# Patient Record
Sex: Male | Born: 1965
Health system: Southern US, Community
[De-identification: ages and names within clinical notes are randomized; demographics above are authoritative.]

## PROBLEM LIST (undated history)

## (undated) DIAGNOSIS — I4892 Unspecified atrial flutter: Secondary | ICD-10-CM

## (undated) DIAGNOSIS — Z720 Tobacco use: Secondary | ICD-10-CM

## (undated) HISTORY — PX: WISDOM TOOTH EXTRACTION: SHX21

## (undated) HISTORY — PX: APPENDECTOMY: SHX54

## (undated) HISTORY — DX: Tobacco use: Z72.0

## (undated) HISTORY — PX: LIPOMA EXCISION: SHX5283

---

## 2008-12-24 ENCOUNTER — Ambulatory Visit: Payer: Self-pay | Admitting: Family Medicine

## 2008-12-24 DIAGNOSIS — D179 Benign lipomatous neoplasm, unspecified: Secondary | ICD-10-CM | POA: Insufficient documentation

## 2008-12-24 DIAGNOSIS — K029 Dental caries, unspecified: Secondary | ICD-10-CM | POA: Insufficient documentation

## 2008-12-24 DIAGNOSIS — F411 Generalized anxiety disorder: Secondary | ICD-10-CM | POA: Insufficient documentation

## 2008-12-24 DIAGNOSIS — E669 Obesity, unspecified: Secondary | ICD-10-CM

## 2009-01-06 ENCOUNTER — Encounter (INDEPENDENT_AMBULATORY_CARE_PROVIDER_SITE_OTHER): Payer: Self-pay | Admitting: Family Medicine

## 2009-01-30 ENCOUNTER — Ambulatory Visit (HOSPITAL_COMMUNITY): Admission: RE | Admit: 2009-01-30 | Discharge: 2009-01-30 | Payer: Self-pay | Admitting: General Surgery

## 2009-01-30 ENCOUNTER — Encounter (INDEPENDENT_AMBULATORY_CARE_PROVIDER_SITE_OTHER): Payer: Self-pay | Admitting: General Surgery

## 2009-02-12 ENCOUNTER — Encounter (INDEPENDENT_AMBULATORY_CARE_PROVIDER_SITE_OTHER): Payer: Self-pay | Admitting: Family Medicine

## 2009-03-07 ENCOUNTER — Encounter (INDEPENDENT_AMBULATORY_CARE_PROVIDER_SITE_OTHER): Payer: Self-pay | Admitting: *Deleted

## 2009-03-07 DIAGNOSIS — F17200 Nicotine dependence, unspecified, uncomplicated: Secondary | ICD-10-CM | POA: Insufficient documentation

## 2009-05-12 ENCOUNTER — Encounter: Payer: Self-pay | Admitting: Family Medicine

## 2009-06-16 ENCOUNTER — Encounter: Payer: Self-pay | Admitting: Family Medicine

## 2009-06-30 ENCOUNTER — Encounter: Admission: RE | Admit: 2009-06-30 | Discharge: 2009-08-21 | Payer: Self-pay | Admitting: General Surgery

## 2009-07-06 ENCOUNTER — Encounter: Admission: RE | Admit: 2009-07-06 | Discharge: 2009-08-21 | Payer: Self-pay | Admitting: General Surgery

## 2009-08-04 ENCOUNTER — Telehealth: Payer: Self-pay | Admitting: Family Medicine

## 2009-08-05 ENCOUNTER — Ambulatory Visit: Payer: Self-pay | Admitting: Family Medicine

## 2009-08-05 DIAGNOSIS — H60399 Other infective otitis externa, unspecified ear: Secondary | ICD-10-CM | POA: Insufficient documentation

## 2009-08-05 DIAGNOSIS — H6120 Impacted cerumen, unspecified ear: Secondary | ICD-10-CM

## 2009-08-07 ENCOUNTER — Ambulatory Visit: Payer: Self-pay | Admitting: Family Medicine

## 2009-08-07 DIAGNOSIS — I10 Essential (primary) hypertension: Secondary | ICD-10-CM | POA: Insufficient documentation

## 2010-05-25 ENCOUNTER — Encounter: Payer: Self-pay | Admitting: Family Medicine

## 2010-06-29 NOTE — Assessment & Plan Note (Signed)
Summary: f/u ear/eo   Vital Signs:  Patient profile:   45 year old male Weight:      233.0 pounds BMI:     34.04 Temp:     98.2 degrees F Pulse rate:   87 / minute BP sitting:   162 / 105  (right arm)  Vitals Entered By: Starleen Blue RN (August 07, 2009 9:29 AM) CC: f/u ear Is Patient Diabetic? No Pain Assessment Patient in pain? yes     Location: l ear Intensity: 4   CC:  f/u ear.  History of Present Illness: 1. Left ear pain and hearing problems f/u:  Pt is here for a follow up on his left ear.  He was seen in clinic this Tuesday because of left ear pain and decreased hearing.  Diagnosed with cerumen impaction and otitis externa.  We tried to irrigate the cerumen out at last office visit but was unable to because he couldn't tolerate it because of pain.  We put him on antibiotic ear drops, numbing drops, and pain medicines.  His pain has finally improved today.  He is still unable to hear out of his left ear.      ROS: denies fevers, headache  2. HTN: Pt has had two elevated BP readings.  This visit and last visit.  He thinks that it may be up from his pain.  He was also on a cortisone patch yesterday because of right shoulder pain.  He does not want to start another blood pressure medicine.  Would like to wait until his ear issue is resolved before thinking about this.      ROS: denies chest pain, shortness of breath  Habits & Providers  Alcohol-Tobacco-Diet     Tobacco Status: current     Cigarette Packs/Day: 0.5  Current Medications (verified): 1)  Vicodin 5-500 Mg Tabs (Hydrocodone-Acetaminophen) .... Take 1 Tab By Mouth Every 6 Hours As Needed For Pain 2)  Ear Drops 1.4-5.4 % Soln (Benzocaine-Antipyrine) .... Apply A Couple Drops in Affected Ear Every 6 Hours As Neede For Pain 3)  Neomycin-Polymyxin-Hc 3.5-10000-1 Susp (Neomycin-Polymyxin-Hc) .... Instill 4 Drops in Eary 4 Times A Day  Allergies: No Known Drug Allergies  Past History:  Past Medical  History: Reviewed history from 12/24/2008 and no changes required. tobacco abuse  Social History: Reviewed history from 12/24/2008 and no changes required. Currently lives with mother, neice (05/19/1988), nephew (10/07/1989), son -Campbell Stall (07/04/1990). Single. Smoking cigarettes.  Occasional Etoh, occasional mju. currently unemployed. Worked various jobs in the past.  Getting ready to go back to school for [employer]. In jail for past for being behind in child support. Finished high school.   Physical Exam  General:  Vitals reviewed, no acute distress  Head:  normocephalic and atraumatic.   Eyes:  no conjunctival redness Ears:  R ear normal.  L ear canal impacted with cerumen.  After irrigation still 100% occluded.  L external canal irritated, red.  Painful with ear speculum and pulling on Tragus but overall improved.  Unable to visualize TM. Nose:  no external deformity and no nasal discharge.   Mouth:  pharynx pink and moist.  No dental carries.  No tonsillar swelling. Neck:  Normal, no masses Lungs:  normal respiratory effort.   Heart:  normal rate and regular rhythm.   Extremities:  no edema/cyanosis or clubbing   Impression & Recommendations:  Problem # 1:  IMPACTED CERUMEN (ICD-380.4) Attempted to remove cerumen with water irrigation again today.  Was able  to get a significant chunk of wax out but the ear canal was still completely occluded.  Will send to ENT for treatment. Orders: ENT Referral (ENT) [facility]- Est  Level 4 (16109)  Problem # 2:  OTITIS EXTERNA (ICD-380.10) Assessment: Improved  Will continue current treatment His updated medication list for this problem includes:    Ear Drops 1.4-5.4 % Soln (Benzocaine-antipyrine) .Marland Kitchen... Apply a couple drops in affected ear every 6 hours as neede for pain    Neomycin-polymyxin-hc 3.5-10000-1 Susp (Neomycin-polymyxin-hc) ..... Instill 4 drops in eary 4 times a day  Orders: [facility]- Est  Level 4 (99214)  Problem # 3:  ESSENTIAL  HYPERTENSION (ICD-401.9) Assessment: New  Has had two elevated blood pressure readings.  He has been in some pain both times.  He is resistant to starting an anti-hypertensive.  Will follow up with this after his ear problems are improved.  Orders: [facility]- Est  Level 4 (99214)  Complete Medication List: 1)  Vicodin 5-500 Mg Tabs (Hydrocodone-acetaminophen) .... Take 1 tab by mouth every 6 hours as needed for pain 2)  Ear Drops 1.4-5.4 % Soln (Benzocaine-antipyrine) .... Apply a couple drops in affected ear every 6 hours as neede for pain 3)  Neomycin-polymyxin-hc 3.5-10000-1 Susp (Neomycin-polymyxin-hc) .... Instill 4 drops in eary 4 times a day  Patient Instructions: 1)  Well we tried to get rid of that wax again but it is stuck in there 2)  Continue using the ear drops for the infection and pain 3)  I have put in a referral to an ENT doctor, they have special tools that they can use to help treat you 4)  We also need to keep an eye on your blood pressure, it has been too high the last two times.  It may be from your pain. 5)  Please schedule a follow up appointment with me in 6-8 weeks to follow up on your ear and your blood pressure

## 2010-06-29 NOTE — Assessment & Plan Note (Signed)
Summary: Left ear pain   Vital Signs:  Patient profile:   45 year old male Height:      69.5 inches Weight:      233.9 pounds BMI:     34.17 Temp:     97.7 degrees F oral Pulse rate:   75 / minute BP sitting:   159 / 99  (left arm) Cuff size:   large  Vitals Entered By: Garen Grams LPN (August 05, 1608 8:42 AM) CC: Left ear pain Is Patient Diabetic? No Pain Assessment Patient in pain? yes     Location: right ear   CC:  Left ear pain.  History of Present Illness: 1. Left ear pain:  Pt was using Murine ear drops 2 weeks ago because he thought that he was having some wax build up.  Since last week he has developed some ear pain and decreased hearing.  He has never had something like this happen before.  It is getting worse.  Now his pain is severe.  He is unable to rest, hurts to touch the ear, to swallow.  He is also having a hard time hearing anything now.      ROS: denies fevers, headache, dizziness, balance problems.  Habits & Providers  Alcohol-Tobacco-Diet     Tobacco Status: current  Current Medications (verified): 1)  Vicodin 5-500 Mg Tabs (Hydrocodone-Acetaminophen) .... Take 1 Tab By Mouth Every 6 Hours As Needed For Pain 2)  Ear Drops 1.4-5.4 % Soln (Benzocaine-Antipyrine) .... Apply A Couple Drops in Affected Ear Every 6 Hours As Neede For Pain 3)  Neomycin-Polymyxin-Hc 3.5-10000-1 Susp (Neomycin-Polymyxin-Hc) .... Instill 4 Drops in Eary 4 Times A Day  Allergies: No Known Drug Allergies  Past History:  Past Medical History: Reviewed history from 12/24/2008 and no changes required. tobacco abuse  Social History: Reviewed history from 12/24/2008 and no changes required. Currently lives with mother, neice (05/19/1988), nephew (10/07/1989), son -Campbell Stall (07/04/1990). Single. Smoking cigarettes.  Occasional Etoh, occasional mju. currently unemployed. Worked various jobs in the past.  Getting ready to go back to school for [employer]. In jail for past for being  behind in child support. Finished high school.   Physical Exam  General:  Vitals reviewed, no acute distress  Head:  normocephalic and atraumatic.   Eyes:  no conjunctival redness Ears:  R ear normal.  L ear canal impacted with cerumen.  After irrigation still 75% occluded.  L external canal irritated, red.  Painful with ear speculum and pulling on Tragus.  TM appears intact but unable to fully visualize. Nose:  no external deformity and no nasal discharge.   Mouth:  pharynx pink and moist.  No dental carries.  No tonsillar swelling. Neck:  Normal, no masses Lungs:  normal respiratory effort.   Heart:  normal rate and regular rhythm.     Impression & Recommendations:  Problem # 1:  IMPACTED CERUMEN (ICD-380.4) Assessment New  Improved with saline irrigation.  Still around 75% occluded.  Advised him to avoid q-tips.  Will have him follow up in 2 days to see if we can better visualize tympanic membrane.  Orders: [facility]- Est Level  3 (96045)  Problem # 2:  OTITIS EXTERNA (ICD-380.10) Assessment: New  Ear canal is very tender, red and irritated.  Will treat as Otitis Externa.  Antibiotic eardrops and Benzocaine ear drops for pain.  Will also provide few Vicodin given severity of pain. His updated medication list for this problem includes:    Ear Drops  1.4-5.4 % Soln (Benzocaine-antipyrine) .Marland Kitchen... Apply a couple drops in affected ear every 6 hours as neede for pain    Neomycin-polymyxin-hc 3.5-10000-1 Susp (Neomycin-polymyxin-hc) ..... Instill 4 drops in eary 4 times a day  Orders: [facility]- Est Level  3 (99213)  Complete Medication List: 1)  Vicodin 5-500 Mg Tabs (Hydrocodone-acetaminophen) .... Take 1 tab by mouth every 6 hours as needed for pain 2)  Ear Drops 1.4-5.4 % Soln (Benzocaine-antipyrine) .... Apply a couple drops in affected ear every 6 hours as neede for pain 3)  Neomycin-polymyxin-hc 3.5-10000-1 Susp (Neomycin-polymyxin-hc) .... Instill 4 drops in eary 4 times a  day  Patient Instructions: 1)  Your ear canal was occluded with wax 2)  The water irrigation helped but there is still some wax left in there 3)  I am going to give you some ear drops to help with the pain and to treat you for an infection 4)  I will also give you some pain medicine that you can take by mouth to help you rest 5)  Please schedule a follow up appointment on Friday so that we take another look at your ear Prescriptions: NEOMYCIN-POLYMYXIN-HC 3.5-10000-1 SUSP (NEOMYCIN-POLYMYXIN-HC) Instill 4 drops in eary 4 times a day  #1 x 0   Entered and Authorized by:   Angelena Sole MD   Signed by:   Angelena Sole MD on 08/05/2009   Method used:   Print then Give to Patient   RxID:   [id number] EAR DROPS 1.4-5.4 % SOLN (BENZOCAINE-ANTIPYRINE) Apply a couple drops in affected ear every 6 hours as neede for pain  #1 x 0   Entered and Authorized by:   Angelena Sole MD   Signed by:   Angelena Sole MD on 08/05/2009   Method used:   Print then Give to Patient   RxID:   [id number] VICODIN 5-500 MG TABS (HYDROCODONE-ACETAMINOPHEN) Take 1 tab by mouth every 6 hours as needed for pain  #20 x 0   Entered and Authorized by:   Angelena Sole MD   Signed by:   Angelena Sole MD on 08/05/2009   Method used:   Print then Give to Patient   RxID:   [id number]

## 2010-06-29 NOTE — Consult Note (Signed)
Summary: [facility]   Imported By: Clydell Hakim 07/10/2009 11:00:49  _____________________________________________________________________  External Attachment:    Type:   Image     Comment:   External Document

## 2010-06-29 NOTE — Progress Notes (Signed)
Summary: triage  Phone Note Call from Patient Call back at [phone]   Caller: Patient Summary of Call: used ear wax removal about 2 weeks ago - this am he is not able to hear out of it is at all. Initial call taken by: De Nurse,  August 04, 2009 10:04 AM  Follow-up for Phone Call        states he has no hearing in one ear. appt at 8:30am tomorrow at his request Follow-up by: Golden Circle RN,  August 04, 2009 10:17 AM

## 2010-06-29 NOTE — Consult Note (Signed)
Summary: [facility]   Imported By: Clydell Hakim 06/11/2009 11:37:59  _____________________________________________________________________  External Attachment:    Type:   Image     Comment:   External Document

## 2010-07-01 NOTE — Miscellaneous (Signed)
  Clinical Lists Changes  Problems: Removed problem of PHYSICAL EXAMINATION (ICD-V70.0) Removed problem of Question of  BLOOD IN STOOL (ICD-578.1)

## 2010-09-03 LAB — DIFFERENTIAL
Basophils Relative: 1 % (ref 0–1)
Eosinophils Absolute: 0.1 10*3/uL (ref 0.0–0.7)
Lymphs Abs: 2.3 10*3/uL (ref 0.7–4.0)
Monocytes Absolute: 0.6 10*3/uL (ref 0.1–1.0)
Monocytes Relative: 10 % (ref 3–12)
Neutro Abs: 3 10*3/uL (ref 1.7–7.7)

## 2010-09-03 LAB — CBC
Hemoglobin: 14.5 g/dL (ref 13.0–17.0)
MCHC: 34.1 g/dL (ref 30.0–36.0)
MCV: 96.4 fL (ref 78.0–100.0)
RBC: 4.4 MIL/uL (ref 4.22–5.81)
WBC: 6.1 10*3/uL (ref 4.0–10.5)

## 2011-07-05 ENCOUNTER — Other Ambulatory Visit: Payer: Self-pay | Admitting: Family Medicine

## 2011-12-06 ENCOUNTER — Encounter (HOSPITAL_COMMUNITY): Payer: Self-pay | Admitting: *Deleted

## 2011-12-06 ENCOUNTER — Emergency Department (HOSPITAL_COMMUNITY)
Admission: EM | Admit: 2011-12-06 | Discharge: 2011-12-06 | Disposition: A | Discharge status: Home / Self Care | Payer: No Typology Code available for payment source | Attending: Emergency Medicine | Admitting: Emergency Medicine

## 2011-12-06 ENCOUNTER — Emergency Department (HOSPITAL_COMMUNITY): Payer: No Typology Code available for payment source

## 2011-12-06 DIAGNOSIS — T148XXA Other injury of unspecified body region, initial encounter: Secondary | ICD-10-CM | POA: Insufficient documentation

## 2011-12-06 DIAGNOSIS — S43409A Unspecified sprain of unspecified shoulder joint, initial encounter: Secondary | ICD-10-CM

## 2011-12-06 DIAGNOSIS — M25519 Pain in unspecified shoulder: Secondary | ICD-10-CM | POA: Insufficient documentation

## 2011-12-06 DIAGNOSIS — IMO0002 Reserved for concepts with insufficient information to code with codable children: Secondary | ICD-10-CM | POA: Insufficient documentation

## 2011-12-06 DIAGNOSIS — M7989 Other specified soft tissue disorders: Secondary | ICD-10-CM | POA: Insufficient documentation

## 2011-12-06 DIAGNOSIS — F17200 Nicotine dependence, unspecified, uncomplicated: Secondary | ICD-10-CM | POA: Insufficient documentation

## 2011-12-06 MED ORDER — KETOROLAC TROMETHAMINE 30 MG/ML IJ SOLN
30.0000 mg | Freq: Once | INTRAMUSCULAR | Status: AC
  Administered 2011-12-06: 30 mg via INTRAMUSCULAR
  Filled 2011-12-06: qty 1

## 2011-12-06 MED ORDER — TRAMADOL HCL 50 MG PO TABS: 50.0000 mg | ORAL_TABLET | Freq: Four times a day (QID) | ORAL | Status: AC | PRN

## 2011-12-06 MED ORDER — CYCLOBENZAPRINE HCL 10 MG PO TABS: 10.0000 mg | ORAL_TABLET | Freq: Three times a day (TID) | ORAL | Status: AC | PRN
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
## 2011-12-07 NOTE — ED Provider Notes (Signed)
Medical screening examination/treatment/procedure(s) were performed by non-physician practitioner and as supervising physician I was immediately available for consultation/collaboration.   Hershall Benkert R Kla Bily, MD 12/07/11 1503 

## 2012-03-07 ENCOUNTER — Encounter (HOSPITAL_COMMUNITY): Payer: Self-pay | Admitting: Emergency Medicine

## 2012-03-07 ENCOUNTER — Emergency Department (HOSPITAL_COMMUNITY)
Admission: EM | Admit: 2012-03-07 | Discharge: 2012-03-07 | Disposition: A | Discharge status: Home / Self Care | Payer: Self-pay | Attending: Emergency Medicine | Admitting: Emergency Medicine

## 2012-03-07 DIAGNOSIS — F17200 Nicotine dependence, unspecified, uncomplicated: Secondary | ICD-10-CM | POA: Insufficient documentation

## 2012-03-07 DIAGNOSIS — X58XXXA Exposure to other specified factors, initial encounter: Secondary | ICD-10-CM | POA: Insufficient documentation

## 2012-03-07 DIAGNOSIS — S4980XA Other specified injuries of shoulder and upper arm, unspecified arm, initial encounter: Secondary | ICD-10-CM | POA: Insufficient documentation

## 2012-03-07 DIAGNOSIS — S46909A Unspecified injury of unspecified muscle, fascia and tendon at shoulder and upper arm level, unspecified arm, initial encounter: Secondary | ICD-10-CM | POA: Insufficient documentation

## 2012-03-07 DIAGNOSIS — S46009A Unspecified injury of muscle(s) and tendon(s) of the rotator cuff of unspecified shoulder, initial encounter: Secondary | ICD-10-CM

## 2012-03-07 DIAGNOSIS — I10 Essential (primary) hypertension: Secondary | ICD-10-CM | POA: Insufficient documentation

## 2012-03-07 MED ORDER — TRAMADOL HCL 50 MG PO TABS: 50.0000 mg | ORAL_TABLET | Freq: Four times a day (QID) | ORAL | Status: DC | PRN
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
## 2012-03-07 NOTE — ED Notes (Signed)
Was here for a shoulder injury rt and has taken meds but shoulder still hurts neck hurts spasms hurt

## 2012-09-20 ENCOUNTER — Encounter (HOSPITAL_COMMUNITY): Payer: Self-pay

## 2012-09-20 ENCOUNTER — Emergency Department (HOSPITAL_COMMUNITY): Payer: No Typology Code available for payment source

## 2012-09-20 ENCOUNTER — Emergency Department (HOSPITAL_COMMUNITY)
Admission: EM | Admit: 2012-09-20 | Discharge: 2012-09-20 | Disposition: A | Discharge status: Home / Self Care | Payer: No Typology Code available for payment source | Attending: Emergency Medicine | Admitting: Emergency Medicine

## 2012-09-20 DIAGNOSIS — S199XXA Unspecified injury of neck, initial encounter: Secondary | ICD-10-CM | POA: Insufficient documentation

## 2012-09-20 DIAGNOSIS — S335XXA Sprain of ligaments of lumbar spine, initial encounter: Secondary | ICD-10-CM | POA: Insufficient documentation

## 2012-09-20 DIAGNOSIS — S139XXA Sprain of joints and ligaments of unspecified parts of neck, initial encounter: Secondary | ICD-10-CM | POA: Insufficient documentation

## 2012-09-20 DIAGNOSIS — F17200 Nicotine dependence, unspecified, uncomplicated: Secondary | ICD-10-CM | POA: Insufficient documentation

## 2012-09-20 DIAGNOSIS — Y9389 Activity, other specified: Secondary | ICD-10-CM | POA: Insufficient documentation

## 2012-09-20 DIAGNOSIS — Y92410 Unspecified street and highway as the place of occurrence of the external cause: Secondary | ICD-10-CM | POA: Insufficient documentation

## 2012-09-20 DIAGNOSIS — S161XXA Strain of muscle, fascia and tendon at neck level, initial encounter: Secondary | ICD-10-CM

## 2012-09-20 DIAGNOSIS — S0990XA Unspecified injury of head, initial encounter: Secondary | ICD-10-CM | POA: Insufficient documentation

## 2012-09-20 DIAGNOSIS — S39012A Strain of muscle, fascia and tendon of lower back, initial encounter: Secondary | ICD-10-CM

## 2012-09-20 DIAGNOSIS — S0993XA Unspecified injury of face, initial encounter: Secondary | ICD-10-CM | POA: Insufficient documentation

## 2012-09-20 DIAGNOSIS — Z87828 Personal history of other (healed) physical injury and trauma: Secondary | ICD-10-CM | POA: Insufficient documentation

## 2012-09-20 MED ORDER — HYDROCODONE-ACETAMINOPHEN 5-325 MG PO TABS: 1.0000 | ORAL_TABLET | Freq: Four times a day (QID) | ORAL | Status: DC | PRN

## 2012-09-20 MED ORDER — NAPROXEN 500 MG PO TABS: 500.0000 mg | ORAL_TABLET | Freq: Two times a day (BID) | ORAL | Status: DC

## 2012-09-20 MED ORDER — CYCLOBENZAPRINE HCL 10 MG PO TABS: 10.0000 mg | ORAL_TABLET | Freq: Two times a day (BID) | ORAL | Status: DC | PRN
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
## 2012-09-21 NOTE — ED Provider Notes (Signed)
  Medical screening examination/treatment/procedure(s) were performed by non-physician practitioner and as supervising physician I was immediately available for consultation/collaboration.    Laquincy Eastridge D Haniel Fix, MD 09/21/12 0754 

## 2013-05-08 ENCOUNTER — Emergency Department (HOSPITAL_COMMUNITY)
Admission: EM | Admit: 2013-05-08 | Discharge: 2013-05-08 | Disposition: A | Discharge status: Home / Self Care | Payer: No Typology Code available for payment source | Attending: Emergency Medicine | Admitting: Emergency Medicine

## 2013-05-08 ENCOUNTER — Encounter (HOSPITAL_COMMUNITY): Payer: Self-pay | Admitting: Emergency Medicine

## 2013-05-08 DIAGNOSIS — K029 Dental caries, unspecified: Secondary | ICD-10-CM | POA: Insufficient documentation

## 2013-05-08 DIAGNOSIS — F17200 Nicotine dependence, unspecified, uncomplicated: Secondary | ICD-10-CM | POA: Insufficient documentation

## 2013-05-08 DIAGNOSIS — K047 Periapical abscess without sinus: Secondary | ICD-10-CM | POA: Insufficient documentation

## 2013-05-08 MED ORDER — HYDROCODONE-ACETAMINOPHEN 5-325 MG PO TABS: 1.0000 | ORAL_TABLET | Freq: Four times a day (QID) | ORAL | Status: DC | PRN

## 2013-05-08 MED ORDER — IBUPROFEN 800 MG PO TABS: 800.0000 mg | ORAL_TABLET | Freq: Three times a day (TID) | ORAL | Status: DC

## 2013-05-08 MED ORDER — PENICILLIN V POTASSIUM 250 MG PO TABS
500.0000 mg | ORAL_TABLET | Freq: Once | ORAL | Status: AC
  Administered 2013-05-08: 500 mg via ORAL
  Filled 2013-05-08: qty 2

## 2013-05-08 MED ORDER — OXYCODONE-ACETAMINOPHEN 5-325 MG PO TABS
1.0000 | ORAL_TABLET | Freq: Once | ORAL | Status: AC
  Administered 2013-05-08: 1 via ORAL
  Filled 2013-05-08: qty 1

## 2013-05-08 MED ORDER — PENICILLIN V POTASSIUM 500 MG PO TABS: 500.0000 mg | ORAL_TABLET | Freq: Four times a day (QID) | ORAL | Status: DC

## 2013-05-08 NOTE — ED Notes (Signed)
Pt presents to department for evaluation of L upper molar dental pain. Ongoing for several weeks. 8/10 pain at the time. No facial swelling noted. Pt is alert and oriented x4.

## 2013-05-08 NOTE — ED Provider Notes (Signed)
CSN: [id number]     Arrival date & time 05/08/13  1754 History  This chart was scribed for Jaynie Crumble, PA-C, working with Shanna Cisco, MD, by Andrew Au, ED Scribe. This patient was seen in room TR07C/TR07C and the patient's care was started at 7:51 PM  Chief Complaint  Patient presents with  . Dental Pain     The history is provided by the patient. No language interpreter was used.    HPI Comments: Kevin Mora is a 47 y.o. male who presents to the Emergency Department complaining of intermittent, moderate left upper dental pain over the past 2 weeks. He states that his pain onset while eating ham. He also states that he has had some associated gumline swelling, which he suspects may be an abscess. He states that he popped a "pus pocket" to the area last night, and that there has been "bad tasting" drainage since. He also states that he had an associated subjective fever yesterday. He states that he has taken Goody's powder and Aleve without relief of his pain. ED temperature is 98.9 F. He denies facial swelling or any other pain or symptoms. He is current every day smoker of 0.5 packs/day.   History reviewed. No pertinent past medical history. Past Surgical History  Procedure Laterality Date  . Appendectomy    . Lipoma excision      right shoulder  . Wisdom tooth extraction     History reviewed. No pertinent family history. History  Substance Use Topics  . Smoking status: Current Every Day Smoker -- 0.50 packs/day  . Smokeless tobacco: Not on file  . Alcohol Use: Yes     Comment: Occasional     Review of Systems  Constitutional: Positive for fever (subjective, subsided).  HENT: Negative for facial swelling.   All other systems reviewed and are negative.   Allergies  Review of patient's allergies indicates no known allergies.  Home Medications   Current Outpatient Rx  Name  Route  Sig  Dispense  Refill  . Aspirin-Acetaminophen-Caffeine (GOODYS EXTRA  STRENGTH) 500-325-65 MG PACK   Oral   Take 1 Package by mouth 3 (three) times daily as needed (pain).         Marland Kitchen HYDROcodone-acetaminophen (NORCO) 5-325 MG per tablet   Oral   Take 1 tablet by mouth every 6 (six) hours as needed for pain.   20 tablet   0   . HYDROcodone-acetaminophen (NORCO) 5-325 MG per tablet   Oral   Take 1 tablet by mouth every 6 (six) hours as needed.   20 tablet   0   . ibuprofen (ADVIL,MOTRIN) 800 MG tablet   Oral   Take 1 tablet (800 mg total) by mouth 3 (three) times daily.   21 tablet   0   . penicillin v potassium (VEETID) 500 MG tablet   Oral   Take 1 tablet (500 mg total) by mouth 4 (four) times daily.   40 tablet   0    Triage Vitals BP 152/99  Pulse 101  Temp(Src) 98.9 F (37.2 C) (Oral)  Resp 22  Ht 5\' 10"  (1.778 m)  Wt 224 lb 11.2 oz (101.923 kg)  BMI 32.24 kg/m2  SpO2 97%   Physical Exam  Nursing note and vitals reviewed. Constitutional: He is oriented to person, place, and time. He appears well-developed and well-nourished. No distress.  HENT:  Head: Normocephalic and atraumatic.  Dental cavity to the left 1st upper molar with surrounding gum swelling, erythema  and tenderness to palpation. No drainage.  Eyes: EOM are normal.  Neck: Neck supple. No tracheal deviation present.  Cardiovascular: Normal rate.   Pulmonary/Chest: Effort normal. No respiratory distress.  Musculoskeletal: Normal range of motion.  Neurological: He is alert and oriented to person, place, and time.  Skin: Skin is warm and dry.  Psychiatric: He has a normal mood and affect. His behavior is normal.    ED Course  Procedures (including critical care time) DIAGNOSTIC STUDIES: Oxygen Saturation is 97% on RA, normal by my interpretation.    COORDINATION OF CARE: 7:55 PM- Will discharge with pain medications and antibiotics, with first doses given in the ED.  Pt advised of plan for treatment and pt agrees.  Medications  penicillin v potassium (VEETID)  tablet 500 mg (not administered)  oxyCODONE-acetaminophen (PERCOCET/ROXICET) 5-325 MG per tablet 1 tablet (not administered)   Labs Review Labs Reviewed - No data to display Imaging Review No results found.  EKG Interpretation   None       MDM   1. Dental abscess     Patient with dental pain. On exam most likely dental abscess. Will start on antibiotic, pain medicine, followup with dentist.  Filed Vitals:   05/08/13 1807 05/08/13 1810 05/08/13 2000  BP: 152/99  146/98  Pulse: 101  88  Temp: 98.9 F (37.2 C)  99 F (37.2 C)  TempSrc: Oral  Oral  Resp: 22  17  Height:  5\' 10"  (1.778 m)   Weight:  224 lb 11.2 oz (101.923 kg)   SpO2: 97%  97%     I personally performed the services described in this documentation, which was scribed in my presence. The recorded information has been reviewed and is accurate.    Lottie Mussel, PA-C 05/09/13 [phone]

## 2013-05-09 NOTE — ED Provider Notes (Signed)
Medical screening examination/treatment/procedure(s) were performed by non-physician practitioner and as supervising physician I was immediately available for consultation/collaboration.  EKG Interpretation   None         Shanna Cisco, MD 05/09/13 [phone]

## 2013-06-01 ENCOUNTER — Encounter (HOSPITAL_COMMUNITY): Payer: Self-pay | Admitting: Emergency Medicine

## 2013-06-01 ENCOUNTER — Emergency Department (HOSPITAL_COMMUNITY)
Admission: EM | Admit: 2013-06-01 | Discharge: 2013-06-01 | Disposition: A | Discharge status: Home / Self Care | Payer: No Typology Code available for payment source | Attending: Emergency Medicine | Admitting: Emergency Medicine

## 2013-06-01 DIAGNOSIS — K089 Disorder of teeth and supporting structures, unspecified: Secondary | ICD-10-CM | POA: Insufficient documentation

## 2013-06-01 DIAGNOSIS — R509 Fever, unspecified: Secondary | ICD-10-CM | POA: Insufficient documentation

## 2013-06-01 DIAGNOSIS — K0889 Other specified disorders of teeth and supporting structures: Secondary | ICD-10-CM

## 2013-06-01 DIAGNOSIS — F17200 Nicotine dependence, unspecified, uncomplicated: Secondary | ICD-10-CM | POA: Insufficient documentation

## 2013-06-01 DIAGNOSIS — K029 Dental caries, unspecified: Secondary | ICD-10-CM | POA: Insufficient documentation

## 2013-06-01 DIAGNOSIS — I10 Essential (primary) hypertension: Secondary | ICD-10-CM | POA: Insufficient documentation

## 2013-06-01 MED ORDER — HYDROCODONE-ACETAMINOPHEN 5-325 MG PO TABS: 1.0000 | ORAL_TABLET | Freq: Three times a day (TID) | ORAL | Status: DC

## 2013-06-01 MED ORDER — PENICILLIN V POTASSIUM 500 MG PO TABS: 500.0000 mg | ORAL_TABLET | Freq: Three times a day (TID) | ORAL | Status: DC

## 2013-06-01 MED ORDER — OXYCODONE-ACETAMINOPHEN 5-325 MG PO TABS
2.0000 | ORAL_TABLET | Freq: Once | ORAL | Status: AC
  Administered 2013-06-01: 2 via ORAL
  Filled 2013-06-01: qty 2

## 2013-06-01 NOTE — Discharge Instructions (Signed)
Keep your appointment for 06/27/2013 for treatment of your tooth.  Return if Symptoms worsen.   Take medication as prescribed.   Emergency Department Resource Guide 1) Find a Doctor and Pay Out of Pocket Although you won't have to find out who is covered by your insurance plan, it is a good idea to ask around and get recommendations. You will then need to call the office and see if the doctor you have chosen will accept you as a new patient and what types of options they offer for patients who are self-pay. Some doctors offer discounts or will set up payment plans for their patients who do not have insurance, but you will need to ask so you aren't surprised when you get to your appointment.  2) Contact Your Local Health Department Not all health departments have doctors that can see patients for sick visits, but many do, so it is worth a call to see if yours does. If you don't know where your local health department is, you can check in your phone book. The CDC also has a tool to help you locate your state's health department, and many state websites also have listings of all of their local health departments.  3) Find a [facility] If your illness is not likely to be very severe or complicated, you may want to try a walk in clinic. These are popping up all over the country in pharmacies, drugstores, and shopping centers. They're usually staffed by nurse practitioners or physician assistants that have been trained to treat common illnesses and complaints. They're usually fairly quick and inexpensive. However, if you have serious medical issues or chronic medical problems, these are probably not your best option.  No Primary Care Doctor: - Call Health Connect at  [phone] - they can help you locate a primary care doctor that  accepts your insurance, provides certain services, etc. - Physician Referral Service- [phone]  Chronic Pain Problems: Organization         Address  Phone    Notes  [facility]  [phone] Patients need to be referred by their primary care doctor.   Medication Assistance: Organization         Address  Phone   Notes  [facility] Medication [facility] [address] [place], [place] [address] [phone] --Must be a resident of [facility] -- Must have NO insurance coverage whatsoever (no Medicaid/ Medicare, etc.) -- The pt. MUST have a primary care doctor that directs their care regularly and follows them in the community   MedAssist  [phone]   [employer]  [phone]    Agencies that provide inexpensive medical care: Organization         Address  Phone   Notes  [place]  [phone]   [facility]    [phone]   [facility] [place], [place] [address] [phone]   [place] [address] [place] [phone]   Planned Parenthood    [phone]   [facility]    [phone]   [place] and [place] [address], [place] Phone:  [phone], Fax:  [phone] Hours of Operation:  9 am - 6 pm, M-F.  Also accepts Medicaid/Medicare and self-pay.  [facility] for [place] [address], [place] Phone: [phone], Fax: [phone]. Hours of Operation:  8:30 am - 5:30 pm, M-F.  Also accepts Medicaid and self-pay.  [facility] [place] [address] [place] Phone: [phone]   [place], [place], [place] [phone], Ext. [phone] Mondays & Thursdays: 7-9 AM.  First 15 patients are seen on a first come, first serve basis.    [place] Providers:  Organization         Address  Phone   Notes  [facility] [address], [place] [phone] Also accepts self-pay patients.  [facility]  [address], [place]  [phone]   [place], [address], [place] [phone]   [facility] [address] [place] [phone]   Lucianne Lei [address], [place]   [phone] Only accepts [place] Access [place] patients after they have their name applied to their card.   Self-Pay (no insurance) in [facility]:  Organization         Address  Phone   Notes  Sickle Cell Patients, [facility] [place] [phone]   [facility] [place] [phone]   [facility] [place]  [place], [place], [place] [phone]   [facility]/Dr. Osei-Bonsu  [address], [place] or [address], [place] [phone] Phone number for both [place] and [place] locations is the same.  Urgent Medical and [facility] [address] [place] [phone]   [facility] [address] [place] or [address] [phone]   [facility] [address] [place] [phone], phone; [phone], fax Sees patients 1st and 3rd Saturday of every month.  Must not qualify for public or private insurance (i.e. Medicaid, Medicare, [place] Health Choice, Veterans' Benefits)  Household income should be no more than 200% of the poverty level The clinic cannot treat you if you are pregnant or think you are pregnant  Sexually transmitted diseases are not treated at the clinic.    Dental Care: Organization         Address  Phone  Notes  [facility] Department of [facility] [place] [phone] Accepts children up to age 31 who are enrolled in [place] or [place]; pregnant women with a Medicaid card; and children who have  applied for Medicaid or [place] Health Choice, but were declined, whose parents can pay a reduced fee at time of service.  [facility]  [address], [place] [phone] Accepts children up to age 41 who are enrolled in [place] or [place]; pregnant women with a Medicaid card; and children who have applied for Medicaid or [place] Health Choice, but were declined, whose parents can pay a reduced fee at time of service.  [place] Adult [facility] Access PRO[facility]  [place] [phone] Patients are seen by appointment only. Walk-ins are not accepted. [place] will see patients 30 years of age and older. Monday - Tuesday (8am-5pm) Most Wednesdays (8:30-5pm) $30 per visit, cash only  [place] Adult [employer] PROGRAM  [address], [employer] [phone])  [phone] Patients are seen by appointment only. Walk-ins are not accepted. [place] will see patients 2 years of age and older. One Wednesday Evening (Monthly: Volunteer Based).  $30 per visit, cash only  [place]  [phone] for adults; Children under age 37, call Graduate Pediatric Dentistry at [phone]. Children aged 25-14, please call [phone] to request a pediatric application.  Dental services are provided in all areas of dental care including fillings, crowns and bridges, complete and partial dentures, implants, gum treatment, root canals, and extractions. Preventive care is also provided. Treatment is provided to both adults and children. Patients are selected via a lottery and there is often a waiting list.   [facility] [address], [place]  [phone] [email or web address]   [facility] [address] [place], [place] [phone], Ext. [phone] Second and Fourth Thursday of each month, opens at 6:30 AM; Clinic ends at 9 AM.  Patients are seen on a first-come first-served basis, and a  limited number are seen during each clinic.   [facility]  [address] Hillard Danker [place], [place] [phone]   Eligibility Requirements You must have lived in [place], [place], or [place] counties for at least the last three months.   You cannot be eligible for state or federal sponsored [employer], including [employer], [place], or [employer].   You generally cannot be eligible for healthcare insurance through your employer.    How to apply: Eligibility screenings are held every Tuesday and Wednesday afternoon from 1:00 pm until 4:00 pm. You do not need an appointment for the interview!  [facility] [address] [place], [place]   [place]  [place] Department  [place]  [phone]    Behavioral Health Resources in the Community: Intensive Outpatient Programs Organization         Address  Phone  Notes  [place]. [address], [place], [place] [phone]   [facility]patient [address], [place], [place]   ADS: Alcohol & Drug Svcs [address], [place], [place]   [place] [address]  [place], [place] or [phone]   Substance Abuse Resources Organization         Address  Phone  Notes  Alcohol and Drug Services  [phone]   [place]  [phone]   [facility] [place]   Chinita Pester  [phone]   Residential & Outpatient Substance Abuse Program  [phone]   Psychological Services Organization         Address  Phone  Notes  [facility]  [place]  [phone]   Chairty [place] [address] [place] or [phone]    Mobile Crisis Teams Organization          Address  Phone  Notes  [facility], Mobile Crisis Care Unit  [phone]   [facility]  [address] [place], [place]   [place] [address] [place] [phone]    Self-Help/Support Groups Organization         Address  Phone             Notes  [place]. of [place] - variety of support groups  [phone]  [phone] Call for more information  Narcotics Anonymous (NA), [facility] [address], [employer] [place]  2 meetings at this location   Residential Facilities manager         Address  Phone  Notes  [facility] [place],    [place]  [phone]   [facility]  [address] [place] [address], [place], [place]   [place], [place] [phone] Admissions: 8am-3pm M-F  Incentives Substance [place] [address]    [place], [place] [phone]   [facility] [address] [place], [place], [place]   [facility] [address]  [place], [place]   [facility] Programs - Intensive Outpatient [address] Kristeen [address], [place], [place]   [facility] ([place].) [address].,  [place], [place] [phone] or [phone]   [facility]idential Treatment Services (RTS) [address] [place], [place] Accepts Medicaid  [facility] [place] [address]  [place] [phone] Substance Abuse/Addiction Treatment   [facility] Organization         Address  Phone  Notes  [facility]  [phone]   Domenic Schwab, PhD [address] [place]lis [place], [place]   [phone] or [phone]   [place], [place] [phone]   [facility] [address] [place], [place] [phone] Insurance/Medicaid/sponsorship  through [facility] and Families [address] [place]                                    [place], [place] [phone] [place] [address], [place] [phone]    Dr. Adele Schilder  [phone]   [facility] Dept. 1) [address] [place] 2) [place] 3)  [place] [address], [place] [phone]  [phone]   [place] [phone] or [phone] (After Hours)

## 2013-06-01 NOTE — ED Provider Notes (Signed)
CSN: [id number]     Arrival date & time 06/01/13  2505 History   First MD Initiated Contact with Patient 06/01/13 [phone]     Chief Complaint  Patient presents with  . Dental Injury    originally injured on Nov 26th, seen here on December 10th for same   (Consider location/radiation/quality/duration/timing/severity/associated sxs/prior Treatment) HPI Comments: Kevin Mora is a 48 year-old male with a past medical history of tobacco abuse, HTN, dental caries, presenting the Emergency Department with a chief complaint of dental pain for over a month.  The patient reports discomfort while chewing and at rest.  He reports he is unable to be seen by a dentist until 06/27/2013, for the free clinic.  The patient reports he has taken OTC tylenol and ibuprofen without relief.  Last tylenol was 0100 today.  He reports a subjective fever that is "on and off" for a month.  He reports facial pain, denies facial swelling.   The history is provided by the patient and medical records. No language interpreter was used.    History reviewed. No pertinent past medical history. Past Surgical History  Procedure Laterality Date  . Appendectomy    . Lipoma excision      right shoulder  . Wisdom tooth extraction     History reviewed. No pertinent family history. History  Substance Use Topics  . Smoking status: Current Every Day Smoker -- 0.50 packs/day  . Smokeless tobacco: Not on file  . Alcohol Use: Yes     Comment: Occasional     Review of Systems  Constitutional: Positive for fever. Negative for chills.  HENT: Negative for ear pain, facial swelling and trouble swallowing.   Respiratory: Negative for cough and wheezing.   Gastrointestinal: Negative for nausea, vomiting and diarrhea.    Allergies  Review of patient's allergies indicates no known allergies.  Home Medications   Current Outpatient Rx  Name  Route  Sig  Dispense  Refill  . acetaminophen (TYLENOL) 325 MG tablet   Oral   Take 650 mg  by mouth every 6 (six) hours as needed.         . Aspirin-Acetaminophen-Caffeine (GOODYS EXTRA STRENGTH) [phone] MG PACK   Oral   Take 1 Package by mouth 3 (three) times daily as needed (pain).         Marland Kitchen HYDROcodone-acetaminophen (NORCO/VICODIN) 5-325 MG per tablet   Oral   Take 1-2 tablets by mouth 3 (three) times daily with meals.   15 tablet   0   . penicillin v potassium (VEETID) 500 MG tablet   Oral   Take 1 tablet (500 mg total) by mouth 3 (three) times daily.   30 tablet   0    BP 165/102  Pulse 81  Temp(Src) 98.5 F (36.9 C) (Oral)  Resp 18  Ht 5\' 10"  (1.778 m)  Wt 225 lb (102.059 kg)  BMI 32.28 kg/m2  SpO2 97% Physical Exam  Nursing note and vitals reviewed. Constitutional: He appears well-developed and well-nourished.  HENT:  Head: Normocephalic and atraumatic.  Mouth/Throat: Uvula is midline. No oropharyngeal exudate, posterior oropharyngeal edema or posterior oropharyngeal erythema.    Left upper 2nd molar loose with small amount of pressure.  Pain with palpation of the tooth and pain at the gum line.   Neck: Normal range of motion. Neck supple.  Cardiovascular: Normal rate and regular rhythm.   No murmur heard. Pulmonary/Chest: Effort normal and breath sounds normal. No respiratory distress. He has no wheezes. He  has no rales.  Abdominal: Soft. There is no tenderness.  Lymphadenopathy:    He has no cervical adenopathy.    ED Course  Procedures (including critical care time) Labs Review Labs Reviewed - No data to display Imaging Review No results found.  EKG Interpretation   None       MDM   1. Pain, dental    Pt with a history of dental pain for over a month but has been unable to follow up with a dentist due to financial reasons. PE has a loose upper molar, no obvious abscess.  Discussed lab results, imaging results, and treatment plan with the patient. Return precautions given. Reports understanding and no other concerns at this  time.  Patient is stable for discharge at this time.  Meds given in ED:  Medications  oxyCODONE-acetaminophen (PERCOCET/ROXICET) 5-325 MG per tablet 2 tablet (2 tablets Oral Given 06/01/13 0918)    Discharge Medication List as of 06/01/2013  9:47 AM    START taking these medications   Details  HYDROcodone-acetaminophen (NORCO/VICODIN) 5-325 MG per tablet Take 1-2 tablets by mouth 3 (three) times daily with meals., Starting 06/01/2013, Until Discontinued, Print    penicillin v potassium (VEETID) 500 MG tablet Take 1 tablet (500 mg total) by mouth 3 (three) times daily., Starting 06/01/2013, Until Discontinued, Print          Kevin Kin, PA-C 06/02/13 1712

## 2013-06-01 NOTE — ED Notes (Signed)
Pt c/o dental pain since Thanksgiving.  Patient states he was running a fever the day before yesterday.  Patient was seen here for broken tooth and abscess on Dec 11th, 2014.  He was started on antibiotics and told to follow-up with dentist. Unable to find an appointment until January 29th.

## 2013-06-06 NOTE — ED Provider Notes (Signed)
Medical screening examination/treatment/procedure(s) were performed by non-physician practitioner and as supervising physician I was immediately available for consultation/collaboration.  EKG Interpretation   None        Virgel Manifold, MD 06/06/13 [phone]

## 2013-06-14 ENCOUNTER — Encounter (HOSPITAL_COMMUNITY): Payer: Self-pay | Admitting: Emergency Medicine

## 2013-06-14 ENCOUNTER — Emergency Department (HOSPITAL_COMMUNITY)
Admission: EM | Admit: 2013-06-14 | Discharge: 2013-06-14 | Disposition: A | Discharge status: Home / Self Care | Payer: No Typology Code available for payment source | Attending: Emergency Medicine | Admitting: Emergency Medicine

## 2013-06-14 DIAGNOSIS — K0889 Other specified disorders of teeth and supporting structures: Secondary | ICD-10-CM

## 2013-06-14 DIAGNOSIS — F17200 Nicotine dependence, unspecified, uncomplicated: Secondary | ICD-10-CM | POA: Insufficient documentation

## 2013-06-14 DIAGNOSIS — K089 Disorder of teeth and supporting structures, unspecified: Secondary | ICD-10-CM | POA: Insufficient documentation

## 2013-06-14 MED ORDER — HYDROCODONE-ACETAMINOPHEN 5-325 MG PO TABS: 1.0000 | ORAL_TABLET | Freq: Four times a day (QID) | ORAL | Status: DC | PRN

## 2013-06-14 MED ORDER — PENICILLIN V POTASSIUM 500 MG PO TABS: 500.0000 mg | ORAL_TABLET | Freq: Four times a day (QID) | ORAL | Status: AC

## 2013-06-14 NOTE — Discharge Instructions (Signed)
You have a dental injury. Use the resource guide listed below to help you find a dentist if you do not already have one to followup with. It is very important that you get evaluated by a dentist as soon as possible. Call tomorrow to schedule an appointment. Use your pain medication as prescribed and do not operate heavy machinery while on pain medication. Note that your pain medication contains acetaminophen (Tylenol) & its is not recommended that you use additional acetaminophen (Tylenol) while taking this medication. Take your full course of antibiotics. Read the instructions below. ° °Eat a soft or liquid diet and rinse your mouth out after meals with warm water. You should see a dentist or return here at once if you have increased swelling, increased pain or uncontrolled bleeding from the site of your injury. ° ° °SEEK MEDICAL CARE IF:  °· You have increased pain not controlled with medicines.  °· You have swelling around your tooth, in your face or neck.  °· You have bleeding which starts, continues, or gets worse.  °· You have a fever >101 °· If you are unable to open your mouth ° °RESOURCE GUIDE ° °Dental Problems ° °Patients with Medicaid: °[facility]                     [facility] °[address]                                           [address] °Phone:  [phone]                                                  Phone:  510-2600 ° °If unable to pay or uninsured, contact:  [facility] or [facility] to become qualified for the adult dental clinic. ° °Chronic Pain Problems °Contact [facility]  [phone] °Patients need to be referred by their primary care doctor. ° °Insufficient Money for Medicine °Contact [employer]:  call "211" or [facility] 271-5999. ° °No Primary Care Doctor °Call [facility]  832-8000 °Other agencies that provide inexpensive medical care °   [facility]  [phone] °   [facility]  [phone] °   [facility]  [phone] °   [facility]  832-4777 °   [facility]  [phone] °   [facility]  [phone] ° °Psychological Services °[facility]  [phone] °[facility]  [phone] °[facility]   [phone] (emergency services 641-4993) ° °Substance Abuse Resources °[facility]  336-882-2125 °[facility] 336-784-9470 °[facility] 336-285-9073 °[facility] 336-845-3988 °Residential & Outpatient Substance Abuse Program  800-659-3381 ° °Abuse/Neglect °[place] [facility] (336) 641-3795 °[place] [facility] Hot[facility] 800-378-5315 (After Hours) ° °Emergency Shelter °[facility] (336) 271-5985 ° °Maternity Homes °[facility] (336) 275-9566 °[facility] (704) 372-4663 ° °MRSA Hotline #:   832-7006 ° ° ° °[place] [facility] ° °[facility] [facility]     [employer]                            [facility] °[address] [place]                       [address]      [address]  °[place]                                                [place]                            [place] °Phone:  349-3220                                   Phone:  [phone]                 Phone:  342-8140 ° °[facility] °Phone:  342-8316 ° °[place] [facility] Hotline °(336) 342-1394 °(336) 342-3537 (After Hours) ° ° ° ° °

## 2013-06-14 NOTE — ED Provider Notes (Signed)
History/physical exam/procedure(s) were performed by non-physician practitioner and as supervising physician I was immediately available for consultation/collaboration. I have reviewed all notes and am in agreement with care and plan.   Shaune Pollack, MD 06/14/13 [phone]

## 2013-06-14 NOTE — ED Provider Notes (Signed)
CSN: [id number]     Arrival date & time 06/14/13  1339 History  This chart was scribed for non-physician practitioner, Clayton Bibles, PA-C, working with Shaune Pollack, MD by Roe Coombs, ED Scribe. This patient was seen in room TR07C/TR07C and the patient's care was started at 3:45 PM.    Chief Complaint  Patient presents with  . Dental Pain    The history is provided by the patient. No language interpreter was used.    HPI Comments: Kevin Mora is a 48 y.o. male who presents to the Emergency Department complaining of left upper dental pain since the end of November. Pain is worse with chewing. He has noticed drainage from his mouth. He had an appointment with a dentist but this was cancelled due to another dental emergency. He was given hydrocodone at a past visit, but he ran out and he has been taking Tylenol and Aleve without relief. He finished a course of oral antibiotics last week. He denies fever, chills, or facial pain. He says that he knows his blood pressure has been elevated recently including during this visit - he has never been on any blood pressure medications. He denies chest pain, visual disturbance or headache. He is a current smoker.  He reports that he has a follow up appointment with his PCP at [facility] next week to discuss elevated blood pressure.  No past medical history on file. Past Surgical History  Procedure Laterality Date  . Appendectomy    . Lipoma excision      right shoulder  . Wisdom tooth extraction     No family history on file. History  Substance Use Topics  . Smoking status: Current Every Day Smoker -- 0.50 packs/day  . Smokeless tobacco: Not on file  . Alcohol Use: Yes     Comment: Occasional     Review of Systems  Constitutional: Negative for fever and chills.  HENT: Positive for dental problem. Negative for trouble swallowing.   Eyes: Negative for visual disturbance.  Cardiovascular: Negative for chest pain.  Neurological: Negative for  headaches.    Allergies  Review of patient's allergies indicates no known allergies.  Home Medications   Current Outpatient Rx  Name  Route  Sig  Dispense  Refill  . acetaminophen (TYLENOL) 325 MG tablet   Oral   Take 650 mg by mouth every 6 (six) hours as needed.         . penicillin v potassium (VEETID) 500 MG tablet   Oral   Take 1 tablet (500 mg total) by mouth 3 (three) times daily.   30 tablet   0    Triage Vitals: BP 168/114  Pulse 100  Temp(Src) 98.3 F (36.8 C) (Oral)  Resp 18  Ht 5\' 10"  (1.778 m)  Wt 223 lb 11.2 oz (101.47 kg)  BMI 32.10 kg/m2  SpO2 96% Physical Exam  Nursing note and vitals reviewed. Constitutional: He is oriented to person, place, and time. He appears well-developed and well-nourished. No distress.  HENT:  Head: Normocephalic and atraumatic.  Mouth/Throat: Uvula is midline, oropharynx is clear and moist and mucous membranes are normal. No trismus in the jaw. Abnormal dentition. No dental abscesses or uvula swelling. No oropharyngeal exudate, posterior oropharyngeal edema, posterior oropharyngeal erythema or tonsillar abscesses.  Poor dental hygiene. Pt able to open and close mouth with out difficulty. Airway intact. Uvula midline. Mild gingival swelling with tenderness over affected area, but no fluctuance. No swelling or tenderness of submental and submandibular  regions.  Eyes: Conjunctivae and EOM are normal. Pupils are equal, round, and reactive to light.  Neck: Normal range of motion and full passive range of motion without pain. Neck supple.  Cardiovascular: Normal rate, regular rhythm and normal heart sounds.   Pulmonary/Chest: Effort normal and breath sounds normal. No respiratory distress. He has no wheezes.  Musculoskeletal: Normal range of motion.  Lymphadenopathy:       Head (right side): No submental, no submandibular, no tonsillar, no preauricular and no posterior auricular adenopathy present.       Head (left side): No  submental, no submandibular, no tonsillar, no preauricular and no posterior auricular adenopathy present.    He has no cervical adenopathy.  Neurological: He is alert and oriented to person, place, and time.  Skin: Skin is warm and dry. No rash noted. He is not diaphoretic.  Psychiatric: He has a normal mood and affect. His behavior is normal.    ED Course  Procedures (including critical care time) DIAGNOSTIC STUDIES: Oxygen Saturation is 96% on room air, normal by my interpretation.    COORDINATION OF CARE: 3:49 PM- Patient informed of current plan for treatment and evaluation and agrees with plan at this time.    MDM  No diagnosis found. Patient with toothache.  No gross abscess.  Exam unconcerning for Ludwig's angina or spread of infection.  Will treat with penicillin and pain medicine.  Urged patient to follow-up with dentist.    .I personally performed the services described in this documentation, which was scribed in my presence. The recorded information has been reviewed and is accurate.    Hyman Bible, PA-C 06/14/13 1635

## 2013-06-14 NOTE — ED Notes (Addendum)
PT c/o L upper dental pain x 2 nights.  Pt is supposed to have tooth extracted but dentist cancelled appt d/t emergency.  Pt c/o pain to L upper cheek as well.  BP elevated.  Pt states his last visit it was elevated as well.  Denies headache/dizziness/blurred vision.

## 2015-02-11 ENCOUNTER — Encounter (HOSPITAL_COMMUNITY): Payer: Self-pay | Admitting: Neurology

## 2015-02-11 ENCOUNTER — Emergency Department (HOSPITAL_COMMUNITY)
Admission: EM | Admit: 2015-02-11 | Discharge: 2015-02-11 | Disposition: A | Discharge status: Home / Self Care | Payer: Self-pay | Attending: Emergency Medicine | Admitting: Emergency Medicine

## 2015-02-11 DIAGNOSIS — K088 Other specified disorders of teeth and supporting structures: Secondary | ICD-10-CM | POA: Insufficient documentation

## 2015-02-11 DIAGNOSIS — K029 Dental caries, unspecified: Secondary | ICD-10-CM | POA: Insufficient documentation

## 2015-02-11 DIAGNOSIS — Z791 Long term (current) use of non-steroidal anti-inflammatories (NSAID): Secondary | ICD-10-CM | POA: Insufficient documentation

## 2015-02-11 DIAGNOSIS — Z720 Tobacco use: Secondary | ICD-10-CM | POA: Insufficient documentation

## 2015-02-11 DIAGNOSIS — K0889 Other specified disorders of teeth and supporting structures: Secondary | ICD-10-CM

## 2015-02-11 DIAGNOSIS — R0981 Nasal congestion: Secondary | ICD-10-CM | POA: Insufficient documentation

## 2015-02-11 MED ORDER — PENICILLIN V POTASSIUM 500 MG PO TABS: 500.0000 mg | ORAL_TABLET | Freq: Four times a day (QID) | ORAL | Status: AC

## 2015-02-11 NOTE — ED Notes (Signed)
Pt reports dental pain to right upper teeth since last night. Thinks he has an abscess.

## 2015-02-11 NOTE — ED Provider Notes (Signed)
CSN: [id number]     Arrival date & time 02/11/15  1446 History  This chart was scribed for non-physician practitioner Comer Locket, PA-C, working with Orlie Dakin, MD, by Eustaquio Maize, ED Scribe. This patient was seen in room TR10C/TR10C and the patient's care was started at 3:54 PM.    Chief Complaint  Patient presents with  . Dental Pain   The history is provided by the patient. No language interpreter was used.     HPI Comments: Kevin Mora is a 49 y.o. male who presents to the Emergency Department complaining of sudden onset, constant, 7/10, right upper dental pain that began this morning. Pt states he believes he has a dental abscess, causing the pain.  He states when he smiles he feels pain in his right naris and feels like there is a pus pocket. Pt notes he has difficulty breathing through his right naris as well. He has washed his mouth out with Listerine and taken Tylenol/Advil with mild relief. Pt does not currently have a dentist. He denies fever, chills, or any other associated symptoms. Pt is current every day smoker.    History reviewed. No pertinent past medical history. Past Surgical History  Procedure Laterality Date  . Appendectomy    . Lipoma excision      right shoulder  . Wisdom tooth extraction     No family history on file. Social History  Substance Use Topics  . Smoking status: Current Every Day Smoker -- 0.50 packs/day  . Smokeless tobacco: None  . Alcohol Use: Yes     Comment: Occasional     Review of Systems  Constitutional: Negative for fever and chills.  HENT: Positive for congestion and dental problem.   All other systems reviewed and are negative.  Allergies  Review of patient's allergies indicates no known allergies.  Home Medications   Prior to Admission medications   Medication Sig Start Date End Date Taking? Authorizing Provider  acetaminophen (TYLENOL) 325 MG tablet Take 975 mg by mouth every 6 (six) hours as needed for mild  pain.     Historical Provider, MD  HYDROcodone-acetaminophen (NORCO/VICODIN) 5-325 MG per tablet Take 1-2 tablets by mouth every 6 (six) hours as needed. 06/14/13   Heather Laisure, PA-C  naproxen sodium (ANAPROX) 220 MG tablet Take 660 mg by mouth 2 (two) times daily with a meal.    Historical Provider, MD  penicillin v potassium (VEETID) 500 MG tablet Take 1 tablet (500 mg total) by mouth 4 (four) times daily. 02/11/15 02/18/15  Comer Locket, PA-C   Triage Vitals: BP 147/97 mmHg  Pulse 85  Temp(Src) 98 F (36.7 C) (Oral)  Resp 16  SpO2 96%   Physical Exam  Constitutional: He is oriented to person, place, and time. He appears well-developed and well-nourished. No distress.  Awake, alert, nontoxic appearance.  HENT:  Head: Normocephalic and atraumatic.  Discomfort located to right maxillary incisor. Overall poor dentition. Multiple missing teeth with active caries. Mucous membranes are moist. No unilateral tonsillar swelling, uvula midline, no glossal swelling or elevation. No trismus. No fluctuance or evidence of a drainable abscess. No other evidence of emergent infection, Retropharyngeal or Peritonsillar abscess, Ludwig or Vincents angina. Tolerating secretions well. Patent airway Nares are clear bilaterally.  Eyes: Conjunctivae and EOM are normal. Right eye exhibits no discharge. Left eye exhibits no discharge.  Neck: Neck supple. No tracheal deviation present.  Cardiovascular: Normal rate.   Pulmonary/Chest: Effort normal. No respiratory distress. He exhibits no tenderness.  Abdominal:  Soft. There is no tenderness. There is no rebound.  Musculoskeletal: Normal range of motion. He exhibits no tenderness.  Baseline ROM, no obvious new focal weakness.  Neurological: He is alert and oriented to person, place, and time.  Mental status and motor strength appears baseline for patient and situation.  Skin: Skin is warm and dry. No rash noted.  Psychiatric: He has a normal mood and affect.  His behavior is normal.  Nursing note and vitals reviewed.   ED Course  Procedures (including critical care time)  DIAGNOSTIC STUDIES: Oxygen Saturation is 96% on RA, normal by my interpretation.    COORDINATION OF CARE: 3:58 PM-Discussed treatment plan which includes Rx antibiotics and resource guide to dentist with pt at bedside and pt agreed to plan.   Labs Review Labs Reviewed - No data to display  Imaging Review No results found. I have personally reviewed and evaluated these images and lab results as part of my medical decision-making.   EKG Interpretation None     Meds given in ED:  Medications - No data to display  Discharge Medication List as of 02/11/2015  4:08 PM     Filed Vitals:   02/11/15 1459 02/11/15 1614  BP: 147/97 140/92  Pulse: 85 80  Temp: 98 F (36.7 C) 98.2 F (36.8 C)  TempSrc: Oral Oral  Resp: 16   SpO2: 96% 99%    MDM  Vitals stable - WNL -afebrile Pt resting comfortably in ED. Physical exam as above. No evidence of drainable dental abscess. No evidence of Ludwig's angina, vincents angina or other acute intraoral pathology. Will initiate oral antibiotics, given outpatient resources for definitive dental care. Patient will continue using Motrin and Tylenol at home as needed for discomfort.  I discussed all relevant lab findings and imaging results with pt and they verbalized understanding. Discussed f/u with PCP within 48 hrs and return precautions, pt very amenable to plan.  Final diagnoses:  Pain, dental    I personally performed the services described in this documentation, which was scribed in my presence. The recorded information has been reviewed and is accurate.      Comer Locket, PA-C 02/11/15 1739  Orlie Dakin, MD 02/11/15 1740

## 2015-02-11 NOTE — Discharge Instructions (Signed)
Please take your antibiotics as directed. Please follow-up with dentistry for definitive care. Return to ED for worsening symptoms.  Dental Pain A tooth ache may be caused by cavities (tooth decay). Cavities expose the nerve of the tooth to air and hot or cold temperatures. It may come from an infection or abscess (also called a boil or furuncle) around your tooth. It is also often caused by dental caries (tooth decay). This causes the pain you are having. DIAGNOSIS  Your caregiver can diagnose this problem by exam. TREATMENT   If caused by an infection, it may be treated with medications which kill germs (antibiotics) and pain medications as prescribed by your caregiver. Take medications as directed.  Only take over-the-counter or prescription medicines for pain, discomfort, or fever as directed by your caregiver.  Whether the tooth ache today is caused by infection or dental disease, you should see your dentist as soon as possible for further care. SEEK MEDICAL CARE IF: The exam and treatment you received today has been provided on an emergency basis only. This is not a substitute for complete medical or dental care. If your problem worsens or new problems (symptoms) appear, and you are unable to meet with your dentist, call or return to this location. SEEK IMMEDIATE MEDICAL CARE IF:   You have a fever.  You develop redness and swelling of your face, jaw, or neck.  You are unable to open your mouth.  You have severe pain uncontrolled by pain medicine. MAKE SURE YOU:   Understand these instructions.  Will watch your condition.  Will get help right away if you are not doing well or get worse. Document Released: 05/16/2005 Document Revised: 08/08/2011 Document Reviewed: 01/02/2008 [facility] Patient Information 2015 [place], [place]. This information is not intended to replace advice given to you by your health care provider. Make sure you discuss any questions you have with your health  care provider.   Emergency Department Resource Guide 1) Find a Doctor and Pay Out of Pocket Although you won't have to find out who is covered by your insurance plan, it is a good idea to ask around and get recommendations. You will then need to call the office and see if the doctor you have chosen will accept you as a new patient and what types of options they offer for patients who are self-pay. Some doctors offer discounts or will set up payment plans for their patients who do not have insurance, but you will need to ask so you aren't surprised when you get to your appointment.  2) Contact Your Local Health Department Not all health departments have doctors that can see patients for sick visits, but many do, so it is worth a call to see if yours does. If you don't know where your local health department is, you can check in your phone book. The CDC also has a tool to help you locate your state's health department, and many state websites also have listings of all of their local health departments.  3) Find a [facility] If your illness is not likely to be very severe or complicated, you may want to try a walk in clinic. These are popping up all over the country in pharmacies, drugstores, and shopping centers. They're usually staffed by nurse practitioners or physician assistants that have been trained to treat common illnesses and complaints. They're usually fairly quick and inexpensive. However, if you have serious medical issues or chronic medical problems, these are probably not your best  option.  No Primary Care Doctor: - Call Health Connect at  [phone] - they can help you locate a primary care doctor that  accepts your insurance, provides certain services, etc. - Physician Referral Service- [phone]  Chronic Pain Problems: Organization         Address  Phone   Notes  [facility]  [phone] Patients need to be referred by their primary care doctor.    Medication Assistance: Organization         Address  Phone   Notes  [facility] [place] [place], [place] [address] [phone] --Must be a resident of [place] -- Must have NO insurance coverage whatsoever (no Medicaid/ Medicare, etc.) -- The pt. MUST have a primary care doctor that directs their care regularly and follows them in the community   [facility]ist  [phone]   [employer]  [phone]    Agencies that provide inexpensive medical care: Organization         Address  Phone   Notes  [place]  [phone]   [facility]    [phone]   [facility] [place], [place] [address] [phone]   [place] [address], [place] [phone]   Planned Parenthood    [phone]   [facility]    [phone]   [place] and [place] [address], [place] Phone:  [phone], Fax:  [phone] Hours of Operation:  9 am - 6 pm, M-F.  Also accepts Medicaid/Medicare and self-pay.  [facility] for [place], [address], [place] Phone: [phone], Fax: [phone]. Hours of Operation:  8:30 am - 5:30 pm, M-F.  Also accepts Medicaid and self-pay.  [facility] [place] [address] [place] Phone: [phone]   [place], [place], [place] [phone], Ext. [phone] Mondays & Thursdays: 7-9 AM.  First 15 patients are seen on a first come, first serve basis.    [place] Providers:  Organization         Address  Phone   Notes  [facility] [address], [place] [phone] Also accepts self-pay patients.  [facility] [address], [place]  [phone]   [place], [address], [place] [phone]   [facility] [address], [place] [phone]   Lucianne Lei [address], [place]   [phone] Only accepts [place] Access [place] patients after they have their name applied to their card.   Self-Pay (no insurance) in [facility]:  Organization         Address  Phone   Notes  Sickle Cell Patients, [facility] [place] [phone]   [facility] Urgent Care [place] [phone]   [facility] [place]  [place], [place], [place] [phone]   [facility]/Dr. Osei-Bonsu  [address], [place] or [address], [place] [phone] Phone number for both [place] and [place] locations is the same.  Urgent Medical and [facility] [address], [place] (  [place]) [phone]   [place], [place] or [address] [phone]   [facility] [address] [place] [phone], phone; [phone], fax Sees patients 1st and 3rd Saturday of every month.  Must not qualify for public or private insurance (i.e. Medicaid, Medicare, [place] Health Choice, Veterans' Benefits)  Household income should be no more than 200% of the poverty level The clinic cannot treat you if you are pregnant or think you are pregnant  Sexually transmitted diseases are not treated at the clinic.    Dental Care: Organization         Address  Phone  Notes  [facility] [place] Department of [facility] [place] [phone] Accepts children up to age 80 who are enrolled in [place] or [place]; pregnant women with a Medicaid card; and children who have applied for Medicaid or [place] Health Choice, but were declined, whose parents can pay a reduced fee at time of service.    [facility] Department of [facility]  [address], [place] [phone] Accepts children up to age 61 who are enrolled in [place] or [place]; pregnant women with a Medicaid card; and children who have applied for Medicaid or [place] Health Choice, but were declined, whose parents can pay a reduced fee at time of service.  [place] Adult Dental Access PROGRAM  [place] [phone] Patients are seen by appointment only. Walk-ins are not accepted. [place] will see patients 60 years of age and older. Monday - Tuesday (8am-5pm) Most Wednesdays (8:30-5pm) $30 per visit, cash only  [facility] Adult Dental Access PROGRAM  [address] [facility] [phone] Patients are seen by appointment only. Walk-ins are not accepted. [place] will see patients 48 years of age and older. One Wednesday Evening (Monthly: Volunteer Based).  $30 per visit, cash only  [facility]  [phone] for adults; Children under age 2, call Graduate Pediatric Dentistry at [phone]. Children aged 58-14, please call [phone] to request a pediatric application.  Dental services are provided in all areas of dental care including fillings, crowns and bridges, complete and partial dentures, implants, gum treatment, root canals, and extractions. Preventive care is also provided. Treatment is provided to both adults and children. Patients are selected via a lottery and there is often a waiting list.   [facility] [address] [place]  [phone] [email or web address]   [facility] [address] [place], [place] [phone], Ext. [phone] Second and Fourth Thursday of each month, opens at 6:30 AM; Clinic ends at 9 AM.  Patients are seen on a first-come first-served basis, and a limited number are seen during each clinic.   [facility]  [address] [place] Danker [place], [place] [phone]   Eligibility Requirements You must have lived in [place], [place], or [place] counties for at least the last three months.   You cannot be eligible for state or federal sponsored [employer], including [employer], [place], or [employer].   You generally cannot be eligible for healthcare insurance through your employer.    How to apply: Eligibility screenings are held every Tuesday and Wednesday afternoon from 1:00 pm until 4:00 pm. You do not need an appointment for the interview!  [facility] [address],  [place], [place]   [place]  [place]  [place]  [phone]    Behavioral Health Resources in the Community: Intensive Outpatient Programs Organization         Address  Phone  Notes  [facility]. [address] [place], [place] [phone]   [facility] Outpatient [address] [place], [place]   ADS: Alcohol & Drug [facility] [address] [place], [place]   [place] [address]  [place], [place] or [phone]   Substance Abuse Resources Organization         Address  Phone  Notes  Alcohol and Drug Services  [phone]   [place]  [phone]   The [place]   Chinita Pester  [phone]   Residential & Outpatient Substance Abuse Program  [phone]   Psychological Services Organization         Address  Phone  Notes  [facility] [place]  [place]  [phone]   [place] [address] [place] or [phone]    Mobile Crisis Teams Organization         Address  Phone  Notes  [facility], Mobile Crisis Care Unit  [phone]   [facility]  [address] [place], [place]   [place] [address], [place] [phone]    Self-Help/Support Groups Organization         Address  Phone             Notes  [place]. of [place] - variety of support groups  [place] Call for more information  Narcotics Anonymous (NA), [facility] [address], [employer] [place]  2 meetings at this location   Special educational needs teacher         Address  Phone  Notes  [facility]idential [facility] [place],    [place]  [phone]   [facility]  [address], [place] [phone], [place], [place]   [place], [place] [phone] Admissions: 8am-3pm M-F  Incentives Substance [place] [address]    [place], [place] [phone]   [facility] [address] [place], [place], [place]   [facility] [address]  [place], [place]   [facility] - Intensive Outpatient [address] Kristeen [address], [place], [place]   [facility] ([place].) [place].,  [place], [place] [phone] or [phone]   [facility] Services (RTS) [address] [place], [place] Accepts Medicaid  [facility] [place] [address]  [place] [phone] Substance Abuse/Addiction Treatment   [facility] Organization         Address  Phone  Notes  [facility]  [phone]   Domenic Schwab, PhD [address] Arlis Port[place], [place]   [phone] or [phone]   [facility] [place], [place] [phone]   [place] [address] [place], [place] [phone] Insurance/Medicaid/sponsorship through [employer] and Families [address], [address] [address]  [place], [place] [phone]  [place], [place] [phone]    Dr. Adele Schilder  [phone]   [facility] Dept. 1) [address], [place] 2) [place] 3)  [place] [address], [place] [phone]  [phone]   [place] [phone] or [phone] (After Hours)

## 2015-03-13 ENCOUNTER — Emergency Department (HOSPITAL_COMMUNITY): Payer: Self-pay

## 2015-03-13 ENCOUNTER — Encounter (HOSPITAL_COMMUNITY): Payer: Self-pay | Admitting: Family Medicine

## 2015-03-13 ENCOUNTER — Emergency Department (HOSPITAL_COMMUNITY)
Admission: EM | Admit: 2015-03-13 | Discharge: 2015-03-13 | Disposition: A | Discharge status: Home / Self Care | Payer: Self-pay | Attending: Emergency Medicine | Admitting: Emergency Medicine

## 2015-03-13 DIAGNOSIS — Z791 Long term (current) use of non-steroidal anti-inflammatories (NSAID): Secondary | ICD-10-CM | POA: Insufficient documentation

## 2015-03-13 DIAGNOSIS — Y998 Other external cause status: Secondary | ICD-10-CM | POA: Insufficient documentation

## 2015-03-13 DIAGNOSIS — S4992XA Unspecified injury of left shoulder and upper arm, initial encounter: Secondary | ICD-10-CM | POA: Insufficient documentation

## 2015-03-13 DIAGNOSIS — M25512 Pain in left shoulder: Secondary | ICD-10-CM

## 2015-03-13 DIAGNOSIS — Y9389 Activity, other specified: Secondary | ICD-10-CM | POA: Insufficient documentation

## 2015-03-13 DIAGNOSIS — Z720 Tobacco use: Secondary | ICD-10-CM | POA: Insufficient documentation

## 2015-03-13 DIAGNOSIS — Y92410 Unspecified street and highway as the place of occurrence of the external cause: Secondary | ICD-10-CM | POA: Insufficient documentation

## 2015-03-13 MED ORDER — HYDROCODONE-ACETAMINOPHEN 5-325 MG PO TABS: 1.0000 | ORAL_TABLET | Freq: Four times a day (QID) | ORAL | Status: DC | PRN

## 2015-03-13 MED ORDER — IBUPROFEN 400 MG PO TABS
600.0000 mg | ORAL_TABLET | Freq: Once | ORAL | Status: AC
  Administered 2015-03-13: 600 mg via ORAL
  Filled 2015-03-13: qty 2
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
## 2015-03-13 NOTE — Discharge Instructions (Signed)
Please read attached information. If you experience any new or worsening signs or symptoms please return to the emergency room for evaluation. Please follow-up with your primary care provider or specialist as discussed. Please use medication prescribed only as directed and discontinue taking if you have any concerning signs or symptoms. Do not drink drive or operate any machinery while taking pain medication.

## 2015-04-13 ENCOUNTER — Emergency Department (HOSPITAL_COMMUNITY): Payer: Self-pay

## 2015-04-13 ENCOUNTER — Emergency Department (HOSPITAL_COMMUNITY)
Admission: EM | Admit: 2015-04-13 | Discharge: 2015-04-13 | Disposition: A | Discharge status: Home / Self Care | Payer: Self-pay | Attending: Emergency Medicine | Admitting: Emergency Medicine

## 2015-04-13 ENCOUNTER — Encounter (HOSPITAL_COMMUNITY): Payer: Self-pay | Admitting: Emergency Medicine

## 2015-04-13 DIAGNOSIS — X58XXXA Exposure to other specified factors, initial encounter: Secondary | ICD-10-CM | POA: Insufficient documentation

## 2015-04-13 DIAGNOSIS — Y9389 Activity, other specified: Secondary | ICD-10-CM | POA: Insufficient documentation

## 2015-04-13 DIAGNOSIS — Z720 Tobacco use: Secondary | ICD-10-CM | POA: Insufficient documentation

## 2015-04-13 DIAGNOSIS — S4992XA Unspecified injury of left shoulder and upper arm, initial encounter: Secondary | ICD-10-CM | POA: Insufficient documentation

## 2015-04-13 DIAGNOSIS — I10 Essential (primary) hypertension: Secondary | ICD-10-CM | POA: Insufficient documentation

## 2015-04-13 DIAGNOSIS — Y998 Other external cause status: Secondary | ICD-10-CM | POA: Insufficient documentation

## 2015-04-13 DIAGNOSIS — Z791 Long term (current) use of non-steroidal anti-inflammatories (NSAID): Secondary | ICD-10-CM | POA: Insufficient documentation

## 2015-04-13 DIAGNOSIS — Y9289 Other specified places as the place of occurrence of the external cause: Secondary | ICD-10-CM | POA: Insufficient documentation

## 2015-04-13 DIAGNOSIS — M25512 Pain in left shoulder: Secondary | ICD-10-CM

## 2015-04-13 MED ORDER — IBUPROFEN 400 MG PO TABS
800.0000 mg | ORAL_TABLET | Freq: Once | ORAL | Status: AC
  Administered 2015-04-13: 800 mg via ORAL
  Filled 2015-04-13: qty 2

## 2015-04-13 MED ORDER — IBUPROFEN 800 MG PO TABS: 800.0000 mg | ORAL_TABLET | Freq: Three times a day (TID) | ORAL | Status: DC

## 2015-04-13 NOTE — ED Notes (Signed)
Patient states slipped on some liquid at work yesterday, and slid into corner of wall.  Patient states hurt his L shoulder.  Patient states previous injury to L shoulder x 1 month ago, but did not follow up with ortho because they wanted cash up front. Patient took tylenol at home with little relief.

## 2015-04-13 NOTE — Discharge Instructions (Signed)
Take your medications as prescribed as needed for pain relief. You may also apply ice to her left shoulder for 15-20 minutes 3-4 times daily as needed for pain relief. Follow-up with orthopedics listed above. Return to the emergency department if symptoms worsen or new onset of numbness, tingling, weakness.
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
## 2016-02-10 ENCOUNTER — Emergency Department (HOSPITAL_COMMUNITY): Payer: PRIVATE HEALTH INSURANCE

## 2016-02-10 ENCOUNTER — Encounter (HOSPITAL_COMMUNITY): Payer: Self-pay | Admitting: Emergency Medicine

## 2016-02-10 ENCOUNTER — Emergency Department (HOSPITAL_COMMUNITY)
Admission: EM | Admit: 2016-02-10 | Discharge: 2016-02-10 | Disposition: A | Discharge status: Home / Self Care | Payer: PRIVATE HEALTH INSURANCE | Attending: Emergency Medicine | Admitting: Emergency Medicine

## 2016-02-10 DIAGNOSIS — I10 Essential (primary) hypertension: Secondary | ICD-10-CM | POA: Diagnosis not present

## 2016-02-10 DIAGNOSIS — M79672 Pain in left foot: Secondary | ICD-10-CM | POA: Diagnosis present

## 2016-02-10 DIAGNOSIS — Z79899 Other long term (current) drug therapy: Secondary | ICD-10-CM | POA: Diagnosis not present

## 2016-02-10 DIAGNOSIS — F17200 Nicotine dependence, unspecified, uncomplicated: Secondary | ICD-10-CM | POA: Insufficient documentation

## 2016-02-10 MED ORDER — NAPROXEN 500 MG PO TABS: 500.0000 mg | ORAL_TABLET | Freq: Two times a day (BID) | ORAL | 0 refills | Status: DC

## 2016-02-10 MED ORDER — OXYCODONE-ACETAMINOPHEN 5-325 MG PO TABS: 1.0000 | ORAL_TABLET | ORAL | 0 refills | Status: DC | PRN

## 2016-02-10 NOTE — Progress Notes (Signed)
Orthopedic Tech Progress Note Patient Details:  Kevin Mora 1965-06-26 [id number]  Ortho Devices Type of Ortho Device: Crutches Ortho Device/Splint Interventions: Application   Hildred Priest 02/10/2016, 8:28 AM

## 2016-02-10 NOTE — ED Triage Notes (Signed)
Pt states that his left heel feels like he is "walking on glass". Pt ambulatory to the room.

## 2016-02-10 NOTE — ED Provider Notes (Signed)
[facility] DEPT Provider Note   CSN: [id number] Arrival date & time: 02/10/16  [id number]     History   Chief Complaint Chief Complaint  Patient presents with  . Foot Pain    HPI Kevin Mora is a 50 y.o. male.  The history is provided by the patient.  Foot Pain   He complains of pain in the left heel for the last week. He describes it as like he is walking on glass. Pain is rated at 9/10. It is worse when he tries to stand or walk on it, better if he keeps his foot elevated. Acetaminophen and ibuprofen gives only transient, minimal relief. He does notice some improvement with applying a compression stocking. He denies any trauma. He does have history of prior fracture of that foot. He denies any actual trauma recently.  Past Medical History:  Diagnosis Date  . Hypertension     Patient Active Problem List   Diagnosis Date Noted  . ESSENTIAL HYPERTENSION 08/07/2009  . OTITIS EXTERNA 08/05/2009  . IMPACTED CERUMEN 08/05/2009  . TOBACCO USER 03/07/2009  . LIPOMA 12/24/2008  . OBESITY, UNSPECIFIED 12/24/2008  . ANXIETY 12/24/2008  . DENTAL CARIES 12/24/2008    Past Surgical History:  Procedure Laterality Date  . APPENDECTOMY    . LIPOMA EXCISION     right shoulder  . WISDOM TOOTH EXTRACTION         Home Medications    Prior to Admission medications   Medication Sig Start Date End Date Taking? Authorizing Provider  acetaminophen (TYLENOL) 325 MG tablet Take 975 mg by mouth every 6 (six) hours as needed for mild pain.     Historical Provider, MD  HYDROcodone-acetaminophen (NORCO/VICODIN) 5-325 MG tablet Take 1 tablet by mouth every 6 (six) hours as needed. 03/13/15   Okey Regal, PA-C  ibuprofen (ADVIL,MOTRIN) 800 MG tablet Take 1 tablet (800 mg total) by mouth 3 (three) times daily. 04/13/15   Nona Dell, PA-C  naproxen sodium (ANAPROX) 220 MG tablet Take 660 mg by mouth 2 (two) times daily with a meal.    Historical Provider, MD    Family  History No family history on file.  Social History Social History  Substance Use Topics  . Smoking status: Current Every Day Smoker    Packs/day: 0.50  . Smokeless tobacco: Not on file  . Alcohol use Yes     Comment: Occasional      Allergies   Review of patient's allergies indicates no known allergies.   Review of Systems Review of Systems  All other systems reviewed and are negative.    Physical Exam Updated Vital Signs BP 144/96 (BP Location: Left Arm)   Pulse 84   Temp 97.6 F (36.4 C) (Oral)   Resp 22   Ht 5\' 10"  (1.778 m)   Wt 215 lb (97.5 kg)   SpO2 97%   BMI 30.85 kg/m   Physical Exam  Nursing note and vitals reviewed.  50 year old male, resting comfortably and in no acute distress. Vital signs are significant for hypertension and tachypnea. Oxygen saturation is 97%, which is normal. Head is normocephalic and atraumatic. PERRLA, EOMI. Oropharynx is clear. Neck is nontender and supple without adenopathy or JVD. Back is nontender and there is no CVA tenderness. Lungs are clear without rales, wheezes, or rhonchi. Chest is nontender. Heart has regular rate and rhythm without murmur. Abdomen is soft, flat, nontender without masses or hepatosplenomegaly and peristalsis is normoactive. Extremities have no cyanosis or edema,  full range of motion is present. Skin is warm and dry without rash. Straight leg raise is negative. Capillary refill is prompt. Dorsalis pedis pulses are symmetric. There is tenderness to palpation around the left heel, but maximum pain is elicited with tension applied to the plantar fascia. Neurologic: Mental status is normal, cranial nerves are intact, there are no motor or sensory deficits.  ED Treatments / Results   Radiology Dg Foot Complete Left  Result Date: 02/10/2016 CLINICAL DATA:  Pain and swelling posterior in laterally for the past week with no known injury EXAM: LEFT FOOT - COMPLETE 3+ VIEW COMPARISON:  None in PACs FINDINGS:  The bones are subjectively adequately mineralized. The phalanges and metatarsals are intact. The tarsal bones are normal where visualized. There is a tiny plantar calcaneal spur. There is mild narrowing of the first MTP joint. The other MTP joints as well as the IP joints appear normal. The intertarsal and tarsometatarsal joints exhibit no acute abnormalities. The soft tissues are unremarkable. IMPRESSION: There is no acute bony abnormality of the left foot. There is mild degenerative joint space loss of the first MTP joint. Electronically Signed   By: Pride Gonzales  [place] M.D.   On: 02/10/2016 07:26    Procedures Procedures (including critical care time)  Medications Ordered in ED Medications - No data to display   Initial Impression / Assessment and Plan / ED Course  I have reviewed the triage vital signs and the nursing notes.  Pertinent labs & imaging results that were available during my care of the patient were reviewed by me and considered in my medical decision making (see chart for details).  Clinical Course    Left heel pain strongly suspicious for plantar fasciitis. Old records are reviewed and he has no relevant past visits. Will check foot x-ray.  X-rays are read as negative although I do note loss of Boehler's angle suggesting old calcaneus injury. He is given crutches and prescription for naproxen as well as oxycodone have acetaminophen. Referred to orthopedics for follow-up.  Final Clinical Impressions(s) / ED Diagnoses   Final diagnoses:  Foot pain, left    New Prescriptions New Prescriptions   NAPROXEN (NAPROSYN) 500 MG TABLET    Take 1 tablet (500 mg total) by mouth 2 (two) times daily.   OXYCODONE-ACETAMINOPHEN (PERCOCET) 5-325 MG TABLET    Take 1 tablet by mouth every 4 (four) hours as needed for moderate pain.     Delora Fuel, MD [id number]

## 2016-02-10 NOTE — Discharge Instructions (Signed)
Use crutches as needed. Follow up with the orthopedic surgeon.

## 2017-12-28 DIAGNOSIS — I4892 Unspecified atrial flutter: Secondary | ICD-10-CM

## 2017-12-28 HISTORY — DX: Unspecified atrial flutter: I48.92

## 2018-01-04 ENCOUNTER — Emergency Department (HOSPITAL_COMMUNITY): Payer: Self-pay

## 2018-01-04 ENCOUNTER — Other Ambulatory Visit: Payer: Self-pay

## 2018-01-04 ENCOUNTER — Emergency Department (HOSPITAL_COMMUNITY)
Admission: EM | Admit: 2018-01-04 | Discharge: 2018-01-04 | Disposition: A | Discharge status: Home / Self Care | Payer: Self-pay | Attending: Emergency Medicine | Admitting: Emergency Medicine

## 2018-01-04 DIAGNOSIS — I4892 Unspecified atrial flutter: Secondary | ICD-10-CM | POA: Insufficient documentation

## 2018-01-04 DIAGNOSIS — I10 Essential (primary) hypertension: Secondary | ICD-10-CM | POA: Insufficient documentation

## 2018-01-04 DIAGNOSIS — S93492A Sprain of other ligament of left ankle, initial encounter: Secondary | ICD-10-CM | POA: Insufficient documentation

## 2018-01-04 DIAGNOSIS — Y9289 Other specified places as the place of occurrence of the external cause: Secondary | ICD-10-CM | POA: Insufficient documentation

## 2018-01-04 DIAGNOSIS — S93402A Sprain of unspecified ligament of left ankle, initial encounter: Secondary | ICD-10-CM

## 2018-01-04 DIAGNOSIS — Y9389 Activity, other specified: Secondary | ICD-10-CM | POA: Insufficient documentation

## 2018-01-04 DIAGNOSIS — Y999 Unspecified external cause status: Secondary | ICD-10-CM | POA: Insufficient documentation

## 2018-01-04 DIAGNOSIS — W1849XA Other slipping, tripping and stumbling without falling, initial encounter: Secondary | ICD-10-CM | POA: Insufficient documentation

## 2018-01-04 DIAGNOSIS — M5442 Lumbago with sciatica, left side: Secondary | ICD-10-CM | POA: Insufficient documentation

## 2018-01-04 DIAGNOSIS — F17200 Nicotine dependence, unspecified, uncomplicated: Secondary | ICD-10-CM | POA: Insufficient documentation

## 2018-01-04 LAB — CBC
HCT: 47.2 % (ref 39.0–52.0)
Hemoglobin: 16.1 g/dL (ref 13.0–17.0)
MCH: 32.7 pg (ref 26.0–34.0)
MCHC: 34.1 g/dL (ref 30.0–36.0)
MCV: 95.9 fL (ref 78.0–100.0)
Platelets: 173 10*3/uL (ref 150–400)
RBC: 4.92 MIL/uL (ref 4.22–5.81)
RDW: 12.8 % (ref 11.5–15.5)
WBC: 4.7 10*3/uL (ref 4.0–10.5)

## 2018-01-04 LAB — TSH: TSH: 0.841 u[IU]/mL (ref 0.350–4.500)

## 2018-01-04 LAB — BASIC METABOLIC PANEL
Anion gap: 8 (ref 5–15)
BUN: 6 mg/dL (ref 6–20)
CO2: 24 mmol/L (ref 22–32)
Calcium: 9.6 mg/dL (ref 8.9–10.3)
Chloride: 110 mmol/L (ref 98–111)
Creatinine, Ser: 0.99 mg/dL (ref 0.61–1.24)
GFR calc Af Amer: >60 mL/min (ref >60)
GFR calc non Af Amer: >60 mL/min (ref >60)
Glucose, Bld: 121 mg/dL — ABNORMAL HIGH (ref 70–99)
Potassium: 3.9 mmol/L (ref 3.5–5.1)
Sodium: 142 mmol/L (ref 135–145)

## 2018-01-04 LAB — MAGNESIUM: Magnesium: 1.9 mg/dL (ref 1.7–2.4)

## 2018-01-04 LAB — I-STAT TROPONIN, ED: Troponin i, poc: 0.01 ng/mL (ref 0.00–0.08)

## 2018-01-04 MED ORDER — RIVAROXABAN (XARELTO) VTE STARTER PACK (15 & 20 MG): ORAL_TABLET | ORAL | 0 refills | Status: DC

## 2018-01-04 MED ORDER — DEXAMETHASONE SODIUM PHOSPHATE 10 MG/ML IJ SOLN
10.0000 mg | Freq: Once | INTRAMUSCULAR | Status: AC
Start: 2018-01-04 — End: 2018-01-04
  Administered 2018-01-04: 10 mg via INTRAVENOUS
  Filled 2018-01-04: qty 1

## 2018-01-04 MED ORDER — MORPHINE SULFATE (PF) 4 MG/ML IV SOLN
6.0000 mg | Freq: Once | INTRAVENOUS | Status: AC
  Administered 2018-01-04: 6 mg via INTRAVENOUS
  Filled 2018-01-04: qty 2

## 2018-01-04 MED ORDER — KETOROLAC TROMETHAMINE 15 MG/ML IJ SOLN
15.0000 mg | Freq: Once | INTRAMUSCULAR | Status: AC
  Administered 2018-01-04: 15 mg via INTRAVENOUS
  Filled 2018-01-04: qty 1

## 2018-01-04 MED ORDER — HYDROCODONE-ACETAMINOPHEN 5-325 MG PO TABS: 2.0000 | ORAL_TABLET | Freq: Four times a day (QID) | ORAL | 0 refills | Status: DC | PRN

## 2018-01-04 MED ORDER — DILTIAZEM HCL 25 MG/5ML IV SOLN
20.0000 mg | Freq: Once | INTRAVENOUS | Status: AC
  Administered 2018-01-04: 20 mg via INTRAVENOUS
  Filled 2018-01-04: qty 5

## 2018-01-04 MED ORDER — DILTIAZEM HCL ER COATED BEADS 120 MG PO CP24: 120.0000 mg | ORAL_CAPSULE | Freq: Every day | ORAL | 0 refills | Status: DC

## 2018-01-04 NOTE — ED Notes (Signed)
MD aware of pt's VS upon discharge.

## 2018-01-04 NOTE — ED Triage Notes (Signed)
Pt presents to ED for assessment of left hip, lower back and leg pain x 1 month, and then a trip and fall yesterday in a hole in the ground which caused him to twist his ankle and brought him to his knees.  Patient states hip and back pain now worse as well.  Patient uses a cane, and holds it in his left hand.

## 2018-01-04 NOTE — ED Provider Notes (Signed)
[facility] EMERGENCY DEPARTMENT Provider Note   CSN: [id number] Arrival date & time: 01/04/18  1216     History   Chief Complaint Chief Complaint  Patient presents with  . Ankle Pain    HPI Kevin Mora is a 52 y.o. male.  HPI   52 year old male with left foot/ankle and lower back pain.  Patient was coming down a hill on Tuesday when he lost his balance and twisted his left ankle.  He has had persistent pain since then.  He has been taking ibuprofen and icing it but is still having significant pain so he came in for evaluation.  Patient complains of lower back pain, but this is been going on for approximately 6 weeks.  Pain is across the lower back, worse on the left side with radiation down his posterior leg.  Worse with movement.  No numbness or tingling no focal loss of strength.   He was noted to be in atrial flutter while in the emergency room with a variable block.  This is new onset.  Unclear based on history. He denies any palpitations.  No dyspnea.  Denies any chest pain.  Past history of hypertension.  No known diabetes or CAD.  Is a smoker.  Denies drug use aside from marijuana. Drinks etoh but says only on occasion.  No known thyroid problems.  Past Medical History:  Diagnosis Date  . Hypertension     Patient Active Problem List   Diagnosis Date Noted  . ESSENTIAL HYPERTENSION 08/07/2009  . OTITIS EXTERNA 08/05/2009  . IMPACTED CERUMEN 08/05/2009  . TOBACCO USER 03/07/2009  . LIPOMA 12/24/2008  . OBESITY, UNSPECIFIED 12/24/2008  . ANXIETY 12/24/2008  . DENTAL CARIES 12/24/2008    Past Surgical History:  Procedure Laterality Date  . APPENDECTOMY    . LIPOMA EXCISION     right shoulder  . WISDOM TOOTH EXTRACTION          Home Medications    Prior to Admission medications   Medication Sig Start Date End Date Taking? Authorizing Provider  ibuprofen (ADVIL,MOTRIN) 200 MG tablet Take 200 mg by mouth every 6 (six) hours as needed.     [provider]  naproxen (NAPROSYN) 500 MG tablet Take 1 tablet (500 mg total) by mouth 2 (two) times daily. 1/60/73   Delora Fuel, MD  oxyCODONE-acetaminophen (PERCOCET) 5-325 MG tablet Take 1 tablet by mouth every 4 (four) hours as needed for moderate pain. 12/06/60   Delora Fuel, MD    Family History No family history on file.  Social History Social History   Tobacco Use  . Smoking status: Current Every Day Smoker    Packs/day: 0.50  Substance Use Topics  . Alcohol use: Yes    Comment: Occasional   . Drug use: Yes    Types: Marijuana     Allergies   Patient has no known allergies.   Review of Systems Review of Systems  All systems reviewed and negative, other than as noted in HPI.  Physical Exam Updated Vital Signs BP (!) 133/96   Pulse (!) 144   Temp 98.7 F (37.1 C) (Oral)   Resp 18   Ht 5\' 10"  (1.778 m)   Wt 97.5 kg   SpO2 97%   BMI 30.85 kg/m   Physical Exam  Constitutional: He appears well-developed and well-nourished. No distress.  HENT:  Head: Normocephalic and atraumatic.  Eyes: Conjunctivae are normal. Right eye exhibits no discharge. Left eye exhibits no discharge.  Neck: Neck supple.  Cardiovascular: Regular rhythm and normal heart sounds. Exam reveals no gallop and no friction rub.  No murmur heard. Mild tachycardia.  Irregularly irregular.  Pulmonary/Chest: Effort normal and breath sounds normal. No respiratory distress.  Abdominal: Soft. He exhibits no distension. There is no tenderness.  Musculoskeletal: He exhibits no edema or tenderness.  Mild swelling over the lateral malleolus of the left ankle.  Tenderness to palpation.  Increased pain with range of motion.  Palpable DP pulse.  Sensation intact light touch.  No tenderness of the proximal tip/fib.  Positive straight leg test on the left.  Mild tenderness to palpation in the lumbar spine midline to paraspinally on the left.  No overlying skin changes.  Neurological: He is alert.   Skin: Skin is warm and dry.  Psychiatric: He has a normal mood and affect. His behavior is normal. Thought content normal.  Nursing note and vitals reviewed.    ED Treatments / Results  Labs (all labs ordered are listed, but only abnormal results are displayed) Labs Reviewed  BASIC METABOLIC PANEL - Abnormal; Notable for the following components:      Result Value   Glucose, Bld 121 (*)    All other components within normal limits  CBC  MAGNESIUM  TSH  I-STAT TROPONIN, ED    EKG EKG Interpretation  Date/Time:  Thursday January 04 2018 12:48:53 EDT Ventricular Rate:  99 PR Interval:    QRS Duration: 76 QT Interval:  352 QTC Calculation: 451 R Axis:   55 Text Interpretation:  Atrial flutter with variable A-V block Inferior infarct , age undetermined Abnormal ECG Confirmed by Virgel Manifold [phone]) on 01/04/2018 2:01:02 PM   Radiology Dg Chest 2 View  Result Date: 01/04/2018 CLINICAL DATA:  New onset atrial fibrillation. EXAM: CHEST - 2 VIEW COMPARISON:  None. FINDINGS: Heart size and mediastinal contours are within normal limits. Lungs are clear. No pleural effusion or pneumothorax seen. No acute or suspicious osseous finding. IMPRESSION: No active cardiopulmonary disease. Electronically Signed   By: Franki Cabot M.D.   On: 01/04/2018 13:39   Dg Ankle Complete Left  Result Date: 01/04/2018 CLINICAL DATA:  Leg pain for 1 month, fall yesterday with injury to ankle. EXAM: LEFT ANKLE COMPLETE - 3+ VIEW COMPARISON:  None. FINDINGS: Osseous alignment is normal. Ankle mortise is symmetric. No fracture line or displaced fracture fragment. No acute or suspicious osseous lesion. No degenerative osteoarthritis at the LEFT ankle. Visualized portions of the hindfoot and midfoot are unremarkable. Soft tissues about the LEFT ankle are unremarkable. IMPRESSION: Negative. Electronically Signed   By: Franki Cabot M.D.   On: 01/04/2018 13:37   Dg Foot Complete Left  Result Date:  01/04/2018 CLINICAL DATA:  Leg pain for months, fall yesterday with ankle and foot pain. EXAM: LEFT FOOT - COMPLETE 3+ VIEW COMPARISON:  None. FINDINGS: There is no evidence of fracture or dislocation. There is no evidence of arthropathy or other focal bone abnormality. Soft tissues are unremarkable. IMPRESSION: Negative. Electronically Signed   By: Franki Cabot M.D.   On: 01/04/2018 13:38    Procedures Procedures (including critical care time)  Medications Ordered in ED Medications  diltiazem (CARDIZEM) injection 20 mg (has no administration in time range)  morphine 4 MG/ML injection 6 mg (has no administration in time range)  ketorolac (TORADOL) 15 MG/ML injection 15 mg (has no administration in time range)  dexamethasone (DECADRON) injection 10 mg (has no administration in time range)     Initial  Impression / Assessment and Plan / ED Course  I have reviewed the triage vital signs and the nursing notes.  Pertinent labs & imaging results that were available during my care of the patient were reviewed by me and considered in my medical decision making (see chart for details).     52 year old male with left ankle and lower back pain.  Imaging negative.  No acute neurologic complaints and nonfocal neurologic examination.  Noted to be in new onset of atrial flutter.  Unclear onset based on his history.  He has a chadsvasc score of 1(htn).  We will start him on Eliquis and Cardizem.  Close follow-up in the A. fib clinic.  Final Clinical Impressions(s) / ED Diagnoses   Final diagnoses:  Atrial flutter, unspecified type ([place])  Sprain of left ankle, unspecified ligament, initial encounter  Acute left-sided low back pain with left-sided sciatica    ED Discharge Orders    None       Virgel Manifold, MD 01/07/18 1037

## 2018-01-04 NOTE — ED Notes (Signed)
Pt denies CP/SOB/dizziness. HR 108 Aflutter

## 2018-01-04 NOTE — Progress Notes (Signed)
Orthopedic Tech Progress Note Patient Details:  Kevin Mora 02-May-1966 [id number]  Ortho Devices Type of Ortho Device: ASO Ortho Device/Splint Location: lle Ortho Device/Splint Interventions: Application   Post Interventions Patient Tolerated: Well Instructions Provided: Care of device   Hildred Priest 01/04/2018, 4:16 PM

## 2018-01-04 NOTE — ED Notes (Signed)
Due to patient's HR/VS will upgrade acuity and speak to provider about protocol orders

## 2018-01-05 ENCOUNTER — Telehealth (HOSPITAL_COMMUNITY): Payer: Self-pay | Admitting: *Deleted

## 2018-01-05 NOTE — Telephone Encounter (Signed)
Spoke to pt re follow up with afib clinic since ED visit.  Pt declined appt stating that he did not have insurance and did not want to rack up anymore bills.  Pt stated he wasn't not able to get his medications either.  I gave the patient the name and number to [facility].  Pt thanked me for the information.

## 2018-01-09 ENCOUNTER — Other Ambulatory Visit: Payer: Self-pay

## 2018-01-09 ENCOUNTER — Emergency Department (HOSPITAL_COMMUNITY): Payer: Self-pay

## 2018-01-09 ENCOUNTER — Encounter (HOSPITAL_COMMUNITY): Payer: Self-pay | Admitting: Emergency Medicine

## 2018-01-09 ENCOUNTER — Inpatient Hospital Stay (HOSPITAL_COMMUNITY)
Admission: EM | Admit: 2018-01-09 | Discharge: 2018-01-11 | DRG: 310 | Disposition: A | Discharge status: Home / Self Care | Payer: Self-pay | Attending: Internal Medicine | Admitting: Internal Medicine

## 2018-01-09 ENCOUNTER — Telehealth: Payer: Self-pay | Admitting: Licensed Clinical Social Worker

## 2018-01-09 DIAGNOSIS — R51 Headache: Secondary | ICD-10-CM | POA: Diagnosis present

## 2018-01-09 DIAGNOSIS — I483 Typical atrial flutter: Secondary | ICD-10-CM | POA: Diagnosis present

## 2018-01-09 DIAGNOSIS — R519 Headache, unspecified: Secondary | ICD-10-CM

## 2018-01-09 DIAGNOSIS — F17200 Nicotine dependence, unspecified, uncomplicated: Secondary | ICD-10-CM | POA: Diagnosis present

## 2018-01-09 DIAGNOSIS — Z7901 Long term (current) use of anticoagulants: Secondary | ICD-10-CM

## 2018-01-09 DIAGNOSIS — F17210 Nicotine dependence, cigarettes, uncomplicated: Secondary | ICD-10-CM | POA: Diagnosis present

## 2018-01-09 DIAGNOSIS — Z79899 Other long term (current) drug therapy: Secondary | ICD-10-CM

## 2018-01-09 DIAGNOSIS — I4892 Unspecified atrial flutter: Principal | ICD-10-CM | POA: Diagnosis present

## 2018-01-09 DIAGNOSIS — I10 Essential (primary) hypertension: Secondary | ICD-10-CM | POA: Diagnosis present

## 2018-01-09 DIAGNOSIS — Z6830 Body mass index (BMI) 30.0-30.9, adult: Secondary | ICD-10-CM

## 2018-01-09 DIAGNOSIS — I4891 Unspecified atrial fibrillation: Secondary | ICD-10-CM

## 2018-01-09 HISTORY — DX: Unspecified atrial flutter: I48.92

## 2018-01-09 LAB — COMPREHENSIVE METABOLIC PANEL
ALBUMIN: 4.2 g/dL (ref 3.5–5.0)
ALT: 23 U/L (ref 0–44)
ANION GAP: 12 (ref 5–15)
AST: 26 U/L (ref 15–41)
Alkaline Phosphatase: 55 U/L (ref 38–126)
BUN: 11 mg/dL (ref 6–20)
CHLORIDE: 104 mmol/L (ref 98–111)
CO2: 20 mmol/L — ABNORMAL LOW (ref 22–32)
Calcium: 9.7 mg/dL (ref 8.9–10.3)
Creatinine, Ser: 1.16 mg/dL (ref 0.61–1.24)
GFR calc Af Amer: >60 mL/min (ref >60)
GFR calc non Af Amer: >60 mL/min (ref >60)
GLUCOSE: 100 mg/dL — AB (ref 70–99)
POTASSIUM: 3.8 mmol/L (ref 3.5–5.1)
SODIUM: 136 mmol/L (ref 135–145)
Total Bilirubin: 1.4 mg/dL — ABNORMAL HIGH (ref 0.3–1.2)
Total Protein: 6.7 g/dL (ref 6.5–8.1)

## 2018-01-09 LAB — CBC WITH DIFFERENTIAL/PLATELET
Abs Immature Granulocytes: 0 10*3/uL (ref 0.0–0.1)
BASOS ABS: 0 10*3/uL (ref 0.0–0.1)
BASOS PCT: 0 %
EOS ABS: 0.1 10*3/uL (ref 0.0–0.7)
Eosinophils Relative: 2 %
HCT: 47.7 % (ref 39.0–52.0)
Hemoglobin: 16.1 g/dL (ref 13.0–17.0)
IMMATURE GRANULOCYTES: 0 %
LYMPHS ABS: 1.9 10*3/uL (ref 0.7–4.0)
Lymphocytes Relative: 29 %
MCH: 32.1 pg (ref 26.0–34.0)
MCHC: 33.8 g/dL (ref 30.0–36.0)
MCV: 95 fL (ref 78.0–100.0)
Monocytes Absolute: 0.6 10*3/uL (ref 0.1–1.0)
Monocytes Relative: 9 %
NEUTROS PCT: 60 %
Neutro Abs: 4 10*3/uL (ref 1.7–7.7)
PLATELETS: 180 10*3/uL (ref 150–400)
RBC: 5.02 MIL/uL (ref 4.22–5.81)
RDW: 12.6 % (ref 11.5–15.5)
WBC: 6.6 10*3/uL (ref 4.0–10.5)

## 2018-01-09 LAB — I-STAT TROPONIN, ED
TROPONIN I, POC: 0.02 ng/mL (ref 0.00–0.08)
Troponin i, poc: 0.02 ng/mL (ref 0.00–0.08)

## 2018-01-09 LAB — MAGNESIUM: MAGNESIUM: 2 mg/dL (ref 1.7–2.4)

## 2018-01-09 MED ORDER — RIVAROXABAN 20 MG PO TABS
20.0000 mg | ORAL_TABLET | Freq: Every day | ORAL | Status: DC
  Administered 2018-01-10 (×2): 20 mg via ORAL
  Filled 2018-01-09 (×2): qty 1

## 2018-01-09 MED ORDER — DILTIAZEM LOAD VIA INFUSION
20.0000 mg | Freq: Once | INTRAVENOUS | Status: AC
  Administered 2018-01-09: 20 mg via INTRAVENOUS
  Filled 2018-01-09: qty 20

## 2018-01-09 MED ORDER — DILTIAZEM HCL ER COATED BEADS 120 MG PO CP24
120.0000 mg | ORAL_CAPSULE | Freq: Every day | ORAL | Status: DC
  Administered 2018-01-10 – 2018-01-11 (×2): 120 mg via ORAL
  Filled 2018-01-09 (×2): qty 1

## 2018-01-09 MED ORDER — DIPHENHYDRAMINE HCL 50 MG/ML IJ SOLN
25.0000 mg | Freq: Once | INTRAMUSCULAR | Status: AC
  Administered 2018-01-09: 25 mg via INTRAVENOUS
  Filled 2018-01-09: qty 1

## 2018-01-09 MED ORDER — ACETAMINOPHEN 325 MG PO TABS
650.0000 mg | ORAL_TABLET | ORAL | Status: DC | PRN
  Administered 2018-01-09: 650 mg via ORAL
  Filled 2018-01-09: qty 2

## 2018-01-09 MED ORDER — IOPAMIDOL (ISOVUE-370) INJECTION 76%
50.0000 mL | Freq: Once | INTRAVENOUS | Status: AC | PRN
  Administered 2018-01-09: 50 mL via INTRAVENOUS

## 2018-01-09 MED ORDER — MORPHINE SULFATE (PF) 2 MG/ML IV SOLN: 2.0000 mg | INTRAVENOUS | Status: DC | PRN

## 2018-01-09 MED ORDER — HYDROCODONE-ACETAMINOPHEN 5-325 MG PO TABS
2.0000 | ORAL_TABLET | Freq: Four times a day (QID) | ORAL | Status: DC | PRN
  Administered 2018-01-10: 2 via ORAL
  Filled 2018-01-09: qty 2

## 2018-01-09 MED ORDER — ONDANSETRON HCL 4 MG/2ML IJ SOLN: 4.0000 mg | Freq: Four times a day (QID) | INTRAMUSCULAR | Status: DC | PRN

## 2018-01-09 MED ORDER — SODIUM CHLORIDE 0.9 % IV BOLUS
1000.0000 mL | Freq: Once | INTRAVENOUS | Status: AC
  Administered 2018-01-09: 1000 mL via INTRAVENOUS

## 2018-01-09 MED ORDER — PROCHLORPERAZINE EDISYLATE 10 MG/2ML IJ SOLN
10.0000 mg | Freq: Once | INTRAMUSCULAR | Status: AC
  Administered 2018-01-09: 10 mg via INTRAVENOUS
  Filled 2018-01-09: qty 2

## 2018-01-09 MED ORDER — DILTIAZEM HCL-DEXTROSE 100-5 MG/100ML-% IV SOLN (PREMIX)
5.0000 mg/h | INTRAVENOUS | Status: DC
  Administered 2018-01-09: 10 mg/h via INTRAVENOUS
  Administered 2018-01-10: 5 mg/h via INTRAVENOUS
  Administered 2018-01-10 (×2): 10 mg/h via INTRAVENOUS
  Filled 2018-01-09 (×4): qty 100

## 2018-01-09 MED ORDER — IOPAMIDOL (ISOVUE-370) INJECTION 76%
INTRAVENOUS | Status: AC
  Filled 2018-01-09: qty 50

## 2018-01-09 NOTE — H&P (Signed)
History and Physical    Rasmus Preusser IRJ:[id number] DOB: [birth year] DOA: 01/09/2018  Referring MD/NP/PA: Tegeler  PCP: Default, Provider, MD   Outpatient Specialists: None   Patient coming from: Home  Chief Complaint: Headache  HPI: Kevin Mora is a 52 y.o. male with medical history significant of atrial fibrillation who was recently seen in the ER last Thursday and placed on Xarelto and Cardizem but has not been taking it.  He applied for the arrange card so he could go to the cardiologist.  Patient has no insurance and was unable to obtain medications patient came to the ER with significant frontal headache.  He describes it as a worse headache of his life.  Initially patient was thought to be taking his anticoagulant so intracranial bleed was suspected especially subarachnoid hemorrhage.  Head CT on further evaluation so far has been negative.  He had no fever and no neck stiffness.  Patient has not had any headaches lately.  He denied any recent injury.  Patient was also found to have significant palpitations with further evaluation showing a heart rate of 146 with a flutter on EKG.  Patient has not been able to obtain medication in the outpatient setting.  He was initiated on a Cardizem drip and is being admitted for management of uncontrolled atrial flutter as well as persistent uncontrolled headache.   ED Course: Patient has a temperature 98 with blood pressure of 167/155, heart rate was 147 on presentation respiratory rate of 12/4 oxygen sat 78% on room air initially.  His lab work is essentially normal except for glucose of 100 and CO2 20.  EKG showed atrial flutter with a rate of 146.  Nonspecific ST changes.  Chest x-ray showed no infiltrates.  Patient has received Cardizem bolus and is currently on Cardizem drip.  Rate is down into the 90s on the drip.  He has had a head CT without contrast showing no evidence of acute bleed.  Review of Systems: As per HPI otherwise 10 point  review of systems negative.    Past Medical History:  Diagnosis Date  . Hypertension     Past Surgical History:  Procedure Laterality Date  . APPENDECTOMY    . LIPOMA EXCISION     right shoulder  . WISDOM TOOTH EXTRACTION       reports that he has been smoking. He has been smoking about 0.50 packs per day. He does not have any smokeless tobacco history on file. He reports that he drinks alcohol. He reports that he has current or past drug history. Drug: Marijuana.  No Known Allergies  No family history on file.   Prior to Admission medications   Medication Sig Start Date End Date Taking? Authorizing Provider  naproxen sodium (ALEVE) 220 MG tablet Take 440 mg by mouth daily as needed (Headache).   Yes [provider]  diltiazem (CARDIZEM CD) 120 MG 24 hr capsule Take 1 capsule (120 mg total) by mouth daily. Patient not taking: Reported on 01/09/2018 01/04/18   Virgel Manifold, MD  HYDROcodone-acetaminophen (NORCO/VICODIN) 5-325 MG tablet Take 2 tablets by mouth every 6 (six) hours as needed. Patient not taking: Reported on 01/09/2018 01/04/18   Virgel Manifold, MD  oxyCODONE-acetaminophen (PERCOCET) 5-325 MG tablet Take 1 tablet by mouth every 4 (four) hours as needed for moderate pain. Patient not taking: Reported on 07/28/6008 9/32/35   Delora Fuel, MD  Rivaroxaban 15 & 20 MG TBPK Take as directed on package: Start with one 15mg  tablet by  mouth twice a day with food. On Day 22, switch to one 20mg  tablet once a day with food. Patient not taking: Reported on 01/09/2018 01/04/18   Virgel Manifold, MD    Physical Exam: Vitals:   01/09/18 2050 01/09/18 2130 01/09/18 2140 01/09/18 2150  BP: (!) 165/105 (!) 153/121 (!) 167/155 (!) 129/97  Pulse: (!) 50 (!) 59 72   Resp: 12 19 13 18   Temp:      TempSrc:      SpO2: 100% 99% 95%   Weight:      Height:          Constitutional: NAD, calm, comfortable Vitals:   01/09/18 2050 01/09/18 2130 01/09/18 2140 01/09/18 2150  BP: (!)  165/105 (!) 153/121 (!) 167/155 (!) 129/97  Pulse: (!) 50 (!) 59 72   Resp: 12 19 13 18   Temp:      TempSrc:      SpO2: 100% 99% 95%   Weight:      Height:       Morbidly obese Eyes: PERRL, lids and conjunctivae normal ENMT: Mucous membranes are moist. Posterior pharynx clear of any exudate or lesions.Normal dentition.  Neck: normal, supple, no masses, no thyromegaly Respiratory: clear to auscultation bilaterally, no wheezing, no crackles. Normal respiratory effort. No accessory muscle use.  Cardiovascular: Irregularly irregular rhythm no murmurs / rubs / gallops. No extremity edema. 2+ pedal pulses. No carotid bruits.  Abdomen: no tenderness, no masses palpated. No hepatosplenomegaly. Bowel sounds positive.  Musculoskeletal: no clubbing / cyanosis. No joint deformity upper and lower extremities. Good ROM, no contractures. Normal muscle tone.  Skin: no rashes, lesions, ulcers. No induration Neurologic: CN 2-12 grossly intact. Sensation intact, DTR normal. Strength 5/5 in all 4.  Psychiatric: Normal judgment and insight. Alert and oriented x 3. Normal mood.   Labs on Admission: I have personally reviewed following labs and imaging studies  CBC: Recent Labs  Lab 01/04/18 1255 01/09/18 2014  WBC 4.7 6.6  NEUTROABS  --  4.0  HGB 16.1 16.1  HCT 47.2 47.7  MCV 95.9 95.0  PLT 173 324   Basic Metabolic Panel: Recent Labs  Lab 01/04/18 1255 01/04/18 1426 01/09/18 2014  NA 142  --  136  K 3.9  --  3.8  CL 110  --  104  CO2 24  --  20*  GLUCOSE 121*  --  100*  BUN 6  --  11  CREATININE 0.99  --  1.16  CALCIUM 9.6  --  9.7  MG  --  1.9 2.0   GFR: Estimated Creatinine Clearance: 88.3 mL/min (by C-G formula based on SCr of 1.16 mg/dL). Liver Function Tests: Recent Labs  Lab 01/09/18 2014  AST 26  ALT 23  ALKPHOS 55  BILITOT 1.4*  PROT 6.7  ALBUMIN 4.2   No results for input(s): LIPASE, AMYLASE in the last 168 hours. No results for input(s): AMMONIA in the last 168  hours. Coagulation Profile: No results for input(s): INR, PROTIME in the last 168 hours. Cardiac Enzymes: No results for input(s): CKTOTAL, CKMB, CKMBINDEX, TROPONINI in the last 168 hours. BNP (last 3 results) No results for input(s): PROBNP in the last 8760 hours. HbA1C: No results for input(s): HGBA1C in the last 72 hours. CBG: No results for input(s): GLUCAP in the last 168 hours. Lipid Profile: No results for input(s): CHOL, HDL, LDLCALC, TRIG, CHOLHDL, LDLDIRECT in the last 72 hours. Thyroid Function Tests: No results for input(s): TSH, T4TOTAL, FREET4, T3FREE, THYROIDAB  in the last 72 hours. Anemia Panel: No results for input(s): VITAMINB12, FOLATE, FERRITIN, TIBC, IRON, RETICCTPCT in the last 72 hours. Urine analysis: No results found for: COLORURINE, APPEARANCEUR, LABSPEC, PHURINE, GLUCOSEU, HGBUR, BILIRUBINUR, KETONESUR, PROTEINUR, UROBILINOGEN, NITRITE, LEUKOCYTESUR Sepsis Labs: @LABRCNTIP (procalcitonin:4,lacticidven:4) )No results found for this or any previous visit (from the past 240 hour(s)).   Radiological Exams on Admission: Ct Angio Head W Or Wo Contrast  Result Date: 01/09/2018 CLINICAL DATA:  Thunderclap headache EXAM: CT ANGIOGRAPHY HEAD TECHNIQUE: Multidetector CT imaging of the head was performed using the standard protocol during bolus administration of intravenous contrast. Multiplanar CT image reconstructions and MIPs were obtained to evaluate the vascular anatomy. CONTRAST:  86mL ISOVUE-370 IOPAMIDOL (ISOVUE-370) INJECTION 76% COMPARISON:  None. FINDINGS: CT HEAD FINDINGS Brain: There is no mass, hemorrhage or extra-axial collection. The size and configuration of the ventricles and extra-axial CSF spaces are normal. There is no acute or chronic infarction. The brain parenchyma is normal. Skull: The visualized skull base, calvarium and extracranial soft tissues are normal. Sinuses/Orbits: No fluid levels or advanced mucosal thickening of the visualized paranasal  sinuses. No mastoid or middle ear effusion. The orbits are normal. CTA HEAD FINDINGS ANTERIOR CIRCULATION: --Intracranial internal carotid arteries: There is severe narrowing of the left internal carotid artery proximal cavernous segment. --Anterior cerebral arteries: Normal. Hypoplastic left A1 segment, normal variant --Middle cerebral arteries: Normal. --Posterior communicating arteries: Absent bilaterally. POSTERIOR CIRCULATION: --Basilar artery: Normal. --Posterior cerebral arteries: Normal. --Superior cerebellar arteries: Normal. --Inferior cerebellar arteries: Normal anterior and posterior inferior cerebellar arteries. VENOUS SINUSES: As permitted by contrast timing, patent. ANATOMIC VARIANTS: None DELAYED PHASE: No parenchymal contrast enhancement. Review of the MIP images confirms the above findings. IMPRESSION: 1. No acute intracranial abnormality. 2. 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 Electronically Signed   By: Ulyses Jarred M.D.   On: 01/09/2018 21:46    EKG: Independently reviewed.  It shows atrial flutter with a rate of 146, nonspecific ST changes.  Assessment/Plan Principal Problem:   Atrial flutter with rapid ventricular response (HCC) Active Problems:   TOBACCO USER   Essential hypertension   Bilateral headaches    #1 atrial flutter with rapid ventricular response: Patient will be admitted with Cardizem drip.  We will re-initiate his oral Cardizem and titrate him off the drip.  We will get social work consult for assistance with medications.  Restart Xarelto and consider cardiac consultation.  #2 hypertension: Blood pressure is elevated but much better with the Cardizem.  Continue blood pressure control with Cardizem.  #3 severe headache: No evidence of intracranial bleed.  Most likely related to elevated blood pressure as well as nonspecific causes.  Treat symptomatically.  Lower blood pressure as appropriate.  If headache persists we  may get MRI and consult neurology  #4 tobacco abuse: Counseling is provided.  Offered nicotine patch.    DVT prophylaxis: Xarelto  Code Status: Full  Family Communication: No family members RN discussed care with the patient in detail Disposition Plan: Home  Consults called: None  Admission status: inpatient to stepdown  Severity of Illness: The appropriate patient status for this patient is INPATIENT. Inpatient status is judged to be reasonable and necessary in order to provide the required intensity of service to ensure the patient's safety. The patient's presenting symptoms, physical exam findings, and initial radiographic and laboratory data in the context of their chronic comorbidities is felt to place them at high risk for further clinical deterioration. Furthermore, it is not anticipated  that the patient will be medically stable for discharge from the hospital within 2 midnights of admission. The following factors support the patient status of inpatient.   " The patient's presenting symptoms include headache and palpitations. " The worrisome physical exam findings include atrial tachycardia with palpitations. " The initial radiographic and laboratory data are worrisome because of atrial flutter with rapid ventricular response on EKG. " The chronic co-morbidities include morbid obesity with history of A. fib.   * I certify that at the point of admission it is my clinical judgment that the patient will require inpatient hospital care spanning beyond 2 midnights from the point of admission due to high intensity of service, high risk for further deterioration and high frequency of surveillance required.Barbette Merino MD [facility] Pager [phone]  If 7PM-7AM, please contact night-coverage [email or web address] Password [facility]  01/09/2018, 10:09 PM

## 2018-01-09 NOTE — Telephone Encounter (Signed)
CSW contacted patient to offer assistance with obtaining insurance. Patient reported to A fib clinic that he was struggling with no insurance. Patient reports that he has a pending application for the [facility] card and only needs one additional item to complete the process. CSW discussed orange card and potential PCP options with the card. Patient grateful for call and assistance. CSW offered support and for patient to return call if further help needed. Raquel Sarna, [place], [place]

## 2018-01-09 NOTE — ED Notes (Signed)
Pt shows O2 sat 78%. Pt put on O2 via [place] @ 3LPM. Saturation returns to 100%

## 2018-01-09 NOTE — ED Triage Notes (Signed)
Pt c/o headache onset today while working outside,.  Pt denies nausea, vomiting or dizziness.  Pt st's it's the worse headache he has ever had.  Pt alert and oriented x's 3

## 2018-01-09 NOTE — ED Notes (Signed)
Patient transported to CT 

## 2018-01-09 NOTE — ED Provider Notes (Signed)
[facility] DEPARTMENT Provider Note   CSN: [id number] Arrival date & time: 01/09/18  2000     History   Chief Complaint Chief Complaint  Patient presents with  . Headache    HPI Kevin Mora is a 52 y.o. male.  The history is provided by the patient and medical records. No language interpreter was used.  Headache   This is a new problem. The current episode started 3 to 5 hours ago. The problem occurs constantly. The problem has not changed since onset.The headache is associated with nothing. The pain is located in the temporal region. The quality of the pain is described as dull. The pain is at a severity of 9/10. The pain is severe. Pertinent negatives include no fever, no malaise/fatigue, no chest pressure, no near-syncope, no palpitations, no syncope, no shortness of breath, no nausea and no vomiting. He has tried nothing for the symptoms. The treatment provided no relief.    Past Medical History:  Diagnosis Date  . Hypertension     Patient Active Problem List   Diagnosis Date Noted  . ESSENTIAL HYPERTENSION 08/07/2009  . OTITIS EXTERNA 08/05/2009  . IMPACTED CERUMEN 08/05/2009  . TOBACCO USER 03/07/2009  . LIPOMA 12/24/2008  . OBESITY, UNSPECIFIED 12/24/2008  . ANXIETY 12/24/2008  . DENTAL CARIES 12/24/2008    Past Surgical History:  Procedure Laterality Date  . APPENDECTOMY    . LIPOMA EXCISION     right shoulder  . WISDOM TOOTH EXTRACTION          Home Medications    Prior to Admission medications   Medication Sig Start Date End Date Taking? Authorizing Provider  diltiazem (CARDIZEM CD) 120 MG 24 hr capsule Take 1 capsule (120 mg total) by mouth daily. 01/04/18   Virgel Manifold, MD  HYDROcodone-acetaminophen (NORCO/VICODIN) 5-325 MG tablet Take 2 tablets by mouth every 6 (six) hours as needed. 01/04/18   Virgel Manifold, MD  oxyCODONE-acetaminophen (PERCOCET) 5-325 MG tablet Take 1 tablet by mouth every 4 (four) hours as  needed for moderate pain. Patient not taking: Reported on 08/04/8586 09/29/75   Delora Fuel, MD  Rivaroxaban 15 & 20 MG TBPK Take as directed on package: Start with one 15mg  tablet by mouth twice a day with food. On Day 22, switch to one 20mg  tablet once a day with food. 01/04/18   Virgel Manifold, MD    Family History No family history on file.  Social History Social History   Tobacco Use  . Smoking status: Current Every Day Smoker    Packs/day: 0.50  Substance Use Topics  . Alcohol use: Yes    Comment: Occasional   . Drug use: Yes    Types: Marijuana     Allergies   Patient has no known allergies.   Review of Systems Review of Systems  Constitutional: Negative for activity change, chills, diaphoresis, fatigue, fever and malaise/fatigue.  HENT: Negative for congestion and rhinorrhea.   Eyes: Negative for visual disturbance.  Respiratory: Negative for cough, chest tightness, shortness of breath, wheezing and stridor.   Cardiovascular: Negative for chest pain, palpitations, leg swelling, syncope and near-syncope.  Gastrointestinal: Negative for abdominal distention, abdominal pain, blood in stool, constipation, diarrhea, nausea and vomiting.  Genitourinary: Negative for difficulty urinating, dysuria and flank pain.  Musculoskeletal: Negative for back pain, gait problem, neck pain and neck stiffness.  Skin: Negative for rash and wound.  Neurological: Positive for headaches. Negative for dizziness, weakness, light-headedness and numbness.  Psychiatric/Behavioral: Negative for  agitation and confusion.  All other systems reviewed and are negative.    Physical Exam Updated Vital Signs BP (!) 156/117 (BP Location: Right Arm)   Pulse (!) 147   Temp 98.8 F (37.1 C) (Oral)   Resp 18   Ht 5\' 10"  (1.778 m)   Wt 95.3 kg   SpO2 99%   BMI 30.13 kg/m   Physical Exam  Constitutional: He is oriented to person, place, and time. He appears well-developed and well-nourished. No  distress.  HENT:  Head: Normocephalic.  Nose: Nose normal.  Mouth/Throat: Oropharynx is clear and moist. No oropharyngeal exudate.  Eyes: Pupils are equal, round, and reactive to light. Conjunctivae and EOM are normal.  Neck: Normal range of motion. Neck supple.  Cardiovascular: Regular rhythm and intact distal pulses. Tachycardia present.  No murmur heard. A flutter with RVR.  Pulmonary/Chest: Effort normal and breath sounds normal. No respiratory distress. He has no wheezes. He has no rales. He exhibits no tenderness.  Abdominal: Soft. He exhibits no distension.  Musculoskeletal: Normal range of motion. He exhibits no edema or tenderness.  Neurological: He is alert and oriented to person, place, and time. No sensory deficit. He exhibits normal muscle tone.  Skin: Capillary refill takes less than 2 seconds. He is not diaphoretic. No erythema. No pallor.  Nursing note and vitals reviewed.    ED Treatments / Results  Labs (all labs ordered are listed, but only abnormal results are displayed) Labs Reviewed  COMPREHENSIVE METABOLIC PANEL - Abnormal; Notable for the following components:      Result Value   CO2 20 (*)    Glucose, Bld 100 (*)    Total Bilirubin 1.4 (*)    All other components within normal limits  MAGNESIUM  CBC WITH DIFFERENTIAL/PLATELET  HIV ANTIBODY (ROUTINE TESTING)  MAGNESIUM  COMPREHENSIVE METABOLIC PANEL  CBC WITH DIFFERENTIAL/PLATELET  I-STAT TROPONIN, ED    EKG EKG Interpretation  Date/Time:  Tuesday January 09 2018 20:07:38 EDT Ventricular Rate:  146 PR Interval:    QRS Duration: 126 QT Interval:  268 QTC Calculation: 417 R Axis:   43 Text Interpretation:  Atrial flutter with 2:1 A-V conduction Non-specific intra-ventricular conduction block Nonspecific T wave abnormality Abnormal ECG When compared to prior, similar aflutter with RVR.  No STEMI Confirmed by Antony Blackbird [id number]) on 01/09/2018 8:23:22 PM   Radiology Ct Angio Head W Or Wo  Contrast  Result Date: 01/09/2018 CLINICAL DATA:  Thunderclap headache EXAM: CT ANGIOGRAPHY HEAD TECHNIQUE: Multidetector CT imaging of the head was performed using the standard protocol during bolus administration of intravenous contrast. Multiplanar CT image reconstructions and MIPs were obtained to evaluate the vascular anatomy. CONTRAST:  53mL ISOVUE-370 IOPAMIDOL (ISOVUE-370) INJECTION 76% COMPARISON:  None. FINDINGS: CT HEAD FINDINGS Brain: There is no mass, hemorrhage or extra-axial collection. The size and configuration of the ventricles and extra-axial CSF spaces are normal. There is no acute or chronic infarction. The brain parenchyma is normal. Skull: The visualized skull base, calvarium and extracranial soft tissues are normal. Sinuses/Orbits: No fluid levels or advanced mucosal thickening of the visualized paranasal sinuses. No mastoid or middle ear effusion. The orbits are normal. CTA HEAD FINDINGS ANTERIOR CIRCULATION: --Intracranial internal carotid arteries: There is severe narrowing of the left internal carotid artery proximal cavernous segment. --Anterior cerebral arteries: Normal. Hypoplastic left A1 segment, normal variant --Middle cerebral arteries: Normal. --Posterior communicating arteries: Absent bilaterally. POSTERIOR CIRCULATION: --Basilar artery: Normal. --Posterior cerebral arteries: Normal. --Superior cerebellar arteries: Normal. --Inferior cerebellar arteries: Normal  anterior and posterior inferior cerebellar arteries. VENOUS SINUSES: As permitted by contrast timing, patent. ANATOMIC VARIANTS: None DELAYED PHASE: No parenchymal contrast enhancement. Review of the MIP images confirms the above findings. IMPRESSION: 1. No acute intracranial abnormality. 2. 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 Electronically Signed   By: Ulyses Jarred M.D.   On: 01/09/2018 21:46    Procedures Procedures (including critical care time)  CRITICAL  CARE Performed by: Gwenyth Allegra Tegeler Total critical care time: 35 minutes Critical care time was exclusive of separately billable procedures and treating other patients. Critical care was necessary to treat or prevent imminent or life-threatening deterioration. Critical care was time spent personally by me on the following activities: development of treatment plan with patient and/or surrogate as well as nursing, discussions with consultants, evaluation of patient's response to treatment, examination of patient, obtaining history from patient or surrogate, ordering and performing treatments and interventions, ordering and review of laboratory studies, ordering and review of radiographic studies, pulse oximetry and re-evaluation of patient's condition.   Medications Ordered in ED Medications  diltiazem (CARDIZEM) 1 mg/mL load via infusion 20 mg (20 mg Intravenous Bolus from Bag 01/09/18 2134)    And  diltiazem (CARDIZEM) 100 mg in dextrose 5% 126mL (1 mg/mL) infusion (10 mg/hr Intravenous New Bag/Given 01/09/18 2134)  iopamidol (ISOVUE-370) 76 % injection (has no administration in time range)  HYDROcodone-acetaminophen (NORCO/VICODIN) 5-325 MG per tablet 2 tablet (has no administration in time range)  diltiazem (CARDIZEM CD) 24 hr capsule 120 mg (has no administration in time range)  acetaminophen (TYLENOL) tablet 650 mg (650 mg Oral Given 01/09/18 2247)  ondansetron (ZOFRAN) injection 4 mg (has no administration in time range)  Rivaroxaban (XARELTO) tablet 15 mg (has no administration in time range)  morphine 2 MG/ML injection 2 mg (has no administration in time range)  sodium chloride 0.9 % bolus 1,000 mL (0 mLs Intravenous Stopped 01/09/18 2235)  iopamidol (ISOVUE-370) 76 % injection 50 mL (50 mLs Intravenous Contrast Given 01/09/18 2102)  prochlorperazine (COMPAZINE) injection 10 mg (10 mg Intravenous Given 01/09/18 2249)  diphenhydrAMINE (BENADRYL) injection 25 mg (25 mg Intravenous Given  01/09/18 2248)     Initial Impression / Assessment and Plan / ED Course  I have reviewed the triage vital signs and the nursing notes.  Pertinent labs & imaging results that were available during my care of the patient were reviewed by me and considered in my medical decision making (see chart for details).     Jacarri Gesner is a 52 y.o. male with a past medical history significant for hypertension not on medicines and recent diagnosis of atrial flutter who presents with headache.  Patient reports that he was diagnosed with atrial flutter several weeks ago and was unable to afford the diltiazem or the Xarelto.  He says he has not been taking any medications since his recent visit.  He says he is going to wait to see the [facility] until he gets insurance to afford the medications and see the clinic.  He says that today while working in the yard at around 3 PM, he had onset of headache.  He denies double vision, blurry vision, facial droop, or any neuro deficits.  He denies any neck pain or neck stiffness.  He reports the pain is in his temples.  He reports she is having some photophobia but has no history of headaches in the last 20 years.  He reports he does not feel palpitations,  chest pain or shortness of breath.  He does not feel when he is in the a flutter.  He denies any other complaints.  Next  On exam, patient's heart rate is in the 140s and his EKG shows persistent a flutter with RVR.  Chest is nontender.  Patient is a fast heart rate on palpation.  Patient's abdomen is nontender.  Lungs are clear.  No focal neurologic deficits were seen.  No neck stiffness or nuchal rigidity.  Clinically given the recent although brief blood thinner use, sudden onset of worst headache of life is concerning for a intracranial hemorrhage.  CT and CTA of the head will be ordered.  Patient's imaging will be collected before 6 hours of onset of headache, this will likely rule out intracranial hemorrhage and he  will not need a lumbar puncture.  Next  Patient also be started on fluids, diltiazem, and will need to be admitted for persistent a flutter with RVR as well as headache.  9:56 PM Patient CT and CTA did not show evidence of intracranial hemorrhage.  Doubt subarachnoid hemorrhage as the cause of his headache.  Patient was found to have a carotid stenosis on the left ICA.  Patient was informed of this finding.  Next  Patient's heart rate improved although he still remains in a flutter.  Heart rate is now between 70s and 90s.  Patient continues to have a headache between 8 and 10 in severity.  He will be given a cocktail of headache medications.  Patient is on a diltiazem drip to keep his rate controlled.  Due to the continued a flutter with intermittent RVR and his inability to take his medications at home, I am concerned about the patient's safety going home at this time with recurrent RVR.   Patient will be admitted for further management of his new a flutter.  Final Clinical Impressions(s) / ED Diagnoses   Final diagnoses:  Acute nonintractable headache, unspecified headache type  Atrial fibrillation with RVR (HCC)    Clinical Impression: 1. Acute nonintractable headache, unspecified headache type   2. Atrial fibrillation with RVR ([place])     Disposition: Admit  This note was prepared with assistance of Dragon voice recognition software. Occasional wrong-word or sound-a-like substitutions may have occurred due to the inherent limitations of voice recognition software.      Tegeler, Gwenyth Allegra, MD 01/09/18 [phone]

## 2018-01-10 ENCOUNTER — Inpatient Hospital Stay (HOSPITAL_COMMUNITY): Payer: Self-pay

## 2018-01-10 ENCOUNTER — Encounter (HOSPITAL_COMMUNITY): Payer: Self-pay

## 2018-01-10 ENCOUNTER — Other Ambulatory Visit: Payer: Self-pay

## 2018-01-10 LAB — CBC WITH DIFFERENTIAL/PLATELET
ABS IMMATURE GRANULOCYTES: 0 10*3/uL (ref 0.0–0.1)
BASOS ABS: 0 10*3/uL (ref 0.0–0.1)
BASOS PCT: 1 %
Eosinophils Absolute: 0.2 10*3/uL (ref 0.0–0.7)
Eosinophils Relative: 4 %
HCT: 43.7 % (ref 39.0–52.0)
HEMOGLOBIN: 15.1 g/dL (ref 13.0–17.0)
IMMATURE GRANULOCYTES: 0 %
LYMPHS PCT: 41 %
Lymphs Abs: 2.4 10*3/uL (ref 0.7–4.0)
MCH: 32.3 pg (ref 26.0–34.0)
MCHC: 34.6 g/dL (ref 30.0–36.0)
MCV: 93.6 fL (ref 78.0–100.0)
MONOS PCT: 8 %
Monocytes Absolute: 0.5 10*3/uL (ref 0.1–1.0)
NEUTROS ABS: 2.6 10*3/uL (ref 1.7–7.7)
NEUTROS PCT: 46 %
Platelets: 165 10*3/uL (ref 150–400)
RBC: 4.67 MIL/uL (ref 4.22–5.81)
RDW: 12.6 % (ref 11.5–15.5)
WBC: 5.8 10*3/uL (ref 4.0–10.5)

## 2018-01-10 LAB — MRSA PCR SCREENING: MRSA BY PCR: NEGATIVE

## 2018-01-10 LAB — COMPREHENSIVE METABOLIC PANEL
ALBUMIN: 3.5 g/dL (ref 3.5–5.0)
ALT: 20 U/L (ref 0–44)
ANION GAP: 8 (ref 5–15)
AST: 20 U/L (ref 15–41)
Alkaline Phosphatase: 48 U/L (ref 38–126)
BUN: 8 mg/dL (ref 6–20)
CALCIUM: 9.1 mg/dL (ref 8.9–10.3)
CO2: 23 mmol/L (ref 22–32)
Chloride: 108 mmol/L (ref 98–111)
Creatinine, Ser: 1.12 mg/dL (ref 0.61–1.24)
GFR calc non Af Amer: >60 mL/min (ref >60)
Glucose, Bld: 118 mg/dL — ABNORMAL HIGH (ref 70–99)
POTASSIUM: 3.1 mmol/L — AB (ref 3.5–5.1)
Sodium: 139 mmol/L (ref 135–145)
Total Bilirubin: 1.3 mg/dL — ABNORMAL HIGH (ref 0.3–1.2)
Total Protein: 6 g/dL — ABNORMAL LOW (ref 6.5–8.1)

## 2018-01-10 LAB — MAGNESIUM: Magnesium: 2 mg/dL (ref 1.7–2.4)

## 2018-01-10 LAB — HIV ANTIBODY (ROUTINE TESTING W REFLEX): HIV SCREEN 4TH GENERATION: NONREACTIVE

## 2018-01-10 MED ORDER — LORAZEPAM 1 MG PO TABS
1.0000 mg | ORAL_TABLET | Freq: Once | ORAL | Status: AC
  Administered 2018-01-10: 1 mg via ORAL
  Filled 2018-01-10: qty 1

## 2018-01-10 MED ORDER — POTASSIUM CHLORIDE CRYS ER 20 MEQ PO TBCR
40.0000 meq | EXTENDED_RELEASE_TABLET | ORAL | Status: AC
  Administered 2018-01-10 (×2): 40 meq via ORAL
  Filled 2018-01-10 (×2): qty 2

## 2018-01-10 MED ORDER — LORAZEPAM 1 MG PO TABS
1.0000 mg | ORAL_TABLET | Freq: Once | ORAL | Status: AC
Start: 2018-01-10 — End: 2018-01-10
  Administered 2018-01-10: 1 mg via ORAL
  Filled 2018-01-10: qty 1

## 2018-01-10 MED ORDER — NICOTINE 14 MG/24HR TD PT24
14.0000 mg | MEDICATED_PATCH | Freq: Every day | TRANSDERMAL | Status: DC
  Administered 2018-01-10 – 2018-01-11 (×2): 14 mg via TRANSDERMAL
  Filled 2018-01-10 (×2): qty 1

## 2018-01-10 NOTE — Progress Notes (Signed)
Shift event: RN paged NP because pt was c/o left arm jerking and tingling. NP gave Ativan x 2. NP to bedside. S: says he doesn't know what is wrong, every now and then his left arm will just jump. Every now and then, his left side feels tingly. Not all the time. No other jerking or head or extremities noted and pt denies hx of seizures. Feels fine other than this.  O: Appears well. NAD. VSS. Sleepy after Ativan. Speech is appropriate and fluent. Oriented. Strength 5/5 throughout. Sensation is intact and symmetrical. No facial droop. A/P: 1. Involuntary jerking of left upper extremity-? Etiology. NP sees no signs of stroke. Do not think this is seizure activity since only one limb is involved. He had a neg CT head on admission. If continues, ? MRI brain. K is a little low and we will replace that. Calcium and Na are normal.  KJKG, NP [facility]  Total critical care time: 20 minutes Critical care time was exclusive of separately billable procedures and treating other patients. Critical care was necessary to treat or prevent imminent or life-threatening deterioration. Critical care was time spent personally by me on the following activities: development of treatment plan with patient and/or surrogate as well as nursing, discussions with consultants, evaluation of patient's response to treatment, examination of patient, obtaining history from patient or surrogate, ordering and performing treatments and interventions, ordering and review of laboratory studies, ordering and review of radiographic studies, pulse oximetry and re-evaluation of patient's condition.

## 2018-01-10 NOTE — Progress Notes (Signed)
Took AD material but was too late to do it.  Explained to patient and told him to request chaplain in morning to get witnesses and notary to finish it if he wants to do it.  Had prayer with patient and his mother who was on the phone.  Conard Novak, Chaplain   01/10/18 1700  Clinical Encounter Type  Visited With Patient  Visit Type Initial;Spiritual support  Referral From Physician  Consult/Referral To Chaplain  Spiritual Encounters  Spiritual Needs Prayer  Stress Factors  Patient Stress Factors Health changes  Family Stress Factors Financial concerns;Health changes  Advance Directives (For Healthcare)  Does Patient Have a Medical Advance Directive? No

## 2018-01-10 NOTE — Progress Notes (Signed)
PROGRESS NOTE    Kevin Mora  IRS:[id number] DOB: [birth year] DOA: 01/09/2018 PCP: Patient, No Pcp Per  Outpatient Specialists:     Brief Narrative:  Kevin Mora is a 52 y.o. male with medical history significant of atrial fibrillation who was recently seen in the ER last Thursday and placed on Xarelto and Cardizem but has not been taking it.  Patient presented with severe headache, and urine examination of the patient, patient was noted to have atrial fibrillation with rapid ventricular response.  MRI of the head is negative.  Patient's heart rate is currently on any 70s.  Will wean patient off of Cardizem drip.  Patient was on oral Cardizem 120 mg p.o. once daily prior to admission.  The goal will be to discharge patient home on oral Cardizem.  I am yet to find indication for anticoagulation in this patient.    Assessment & Plan:   Principal Problem:   Atrial flutter with rapid ventricular response (HCC) Active Problems:   TOBACCO USER   Essential hypertension   Bilateral headaches   1 atrial flutter with rapid ventricular response: Patient will be admitted with Cardizem drip.  We will re-initiate his oral Cardizem and titrate him off the drip.  We will get social work consult for assistance with medications.  Restart Xarelto and consider cardiac consultation.  01/10/2018: Slowly wean patient off of Cardizem drip.  Resume patient's home oral Cardizem.  Further management will depend on hospital course.  #2 hypertension: Blood pressure is elevated but much better with the Cardizem.  Continue blood pressure control with Cardizem.  01/10/2018: Continues to deny prior history of hypertension.  Monitor blood pressure closely.  #3 severe headache: No evidence of intracranial bleed.  Most likely related to elevated blood pressure as well as nonspecific causes.  Treat symptomatically.  Lower blood pressure as appropriate.  If headache persists we may get MRI and consult  neurology  01/10/2018: MRI of the brain came back negative.  Discussed results.  #4 tobacco abuse:  Counseled.    DVT prophylaxis: Xarelto  Code Status: Full  Family Communication: No family members RN discussed care with the patient in detail Disposition Plan: Home  Consults called: None  Admission status: inpatient to stepdown   Consultants:   None  Procedures:   None  Antimicrobials:   None   Subjective: Headache has resolved. No fever, chest pain or shortness of breath. No fever or chills  Objective: Vitals:   01/10/18 1438 01/10/18 1612 01/10/18 1709 01/10/18 1958  BP: 98/87     Pulse: 73   73  Resp: 18 18 (!) 24   Temp: 98.7 F (37.1 C)   98.9 F (37.2 C)  TempSrc: Oral   Oral  SpO2: 98%   97%  Weight:      Height:        Intake/Output Summary (Last 24 hours) at 01/10/2018 2121 Last data filed at 01/10/2018 2050 Gross per 24 hour  Intake 2080 ml  Output -  Net 2080 ml   Filed Weights   01/09/18 2004  Weight: 95.3 kg    Examination:  General exam: Appears calm and comfortable  Respiratory system: Clear to auscultation.  Cardiovascular system: S1 & S2 heard. Gastrointestinal system: Abdomen is nondistended, soft and nontender. No organomegaly or masses felt. Normal bowel sounds heard. Central nervous system: Alert and oriented. No focal neurological deficits. Extremities: No leg edema.   Data Reviewed: I have personally reviewed following labs and imaging studies  CBC: Recent  Labs  Lab 01/04/18 1255 01/09/18 2014 01/10/18 0307  WBC 4.7 6.6 5.8  NEUTROABS  --  4.0 2.6  HGB 16.1 16.1 15.1  HCT 47.2 47.7 43.7  MCV 95.9 95.0 93.6  PLT 173 180 833   Basic Metabolic Panel: Recent Labs  Lab 01/04/18 1255 01/04/18 1426 01/09/18 2014 01/09/18 2327 01/10/18 0307  NA 142  --  136  --  139  K 3.9  --  3.8  --  3.1*  CL 110  --  104  --  108  CO2 24  --  20*  --  23  GLUCOSE 121*  --  100*  --  118*  BUN 6  --  11  --  8   CREATININE 0.99  --  1.16  --  1.12  CALCIUM 9.6  --  9.7  --  9.1  MG  --  1.9 2.0 2.0  --    GFR: Estimated Creatinine Clearance: 91.4 mL/min (by C-G formula based on SCr of 1.12 mg/dL). Liver Function Tests: Recent Labs  Lab 01/09/18 2014 01/10/18 0307  AST 26 20  ALT 23 20  ALKPHOS 55 48  BILITOT 1.4* 1.3*  PROT 6.7 6.0*  ALBUMIN 4.2 3.5   No results for input(s): LIPASE, AMYLASE in the last 168 hours. No results for input(s): AMMONIA in the last 168 hours. Coagulation Profile: No results for input(s): INR, PROTIME in the last 168 hours. Cardiac Enzymes: No results for input(s): CKTOTAL, CKMB, CKMBINDEX, TROPONINI in the last 168 hours. BNP (last 3 results) No results for input(s): PROBNP in the last 8760 hours. HbA1C: No results for input(s): HGBA1C in the last 72 hours. CBG: No results for input(s): GLUCAP in the last 168 hours. Lipid Profile: No results for input(s): CHOL, HDL, LDLCALC, TRIG, CHOLHDL, LDLDIRECT in the last 72 hours. Thyroid Function Tests: No results for input(s): TSH, T4TOTAL, FREET4, T3FREE, THYROIDAB in the last 72 hours. Anemia Panel: No results for input(s): VITAMINB12, FOLATE, FERRITIN, TIBC, IRON, RETICCTPCT in the last 72 hours. Urine analysis: No results found for: COLORURINE, APPEARANCEUR, LABSPEC, [place], GLUCOSEU, HGBUR, BILIRUBINUR, KETONESUR, PROTEINUR, UROBILINOGEN, NITRITE, LEUKOCYTESUR Sepsis Labs: @LABRCNTIP (procalcitonin:4,lacticidven:4)  ) Recent Results (from the past 240 hour(s))  MRSA PCR Screening     Status: None   Collection Time: 01/10/18  1:18 AM  Result Value Ref Range Status   MRSA by PCR NEGATIVE NEGATIVE Final    Comment:        The GeneXpert MRSA Assay (FDA approved for NASAL specimens only), is one component of a comprehensive MRSA colonization surveillance program. It is not intended to diagnose MRSA infection nor to guide or monitor treatment for MRSA infections. Performed at [facility], [place] [address], [place], [place] [address]          Radiology Studies: Ct Angio Head W Or Wo Contrast  Result Date: 01/09/2018 CLINICAL DATA:  Thunderclap headache EXAM: CT ANGIOGRAPHY HEAD TECHNIQUE: Multidetector CT imaging of the head was performed using the standard protocol during bolus administration of intravenous contrast. Multiplanar CT image reconstructions and MIPs were obtained to evaluate the vascular anatomy. CONTRAST:  17mL ISOVUE-370 IOPAMIDOL (ISOVUE-370) INJECTION 76% COMPARISON:  None. FINDINGS: CT HEAD FINDINGS Brain: There is no mass, hemorrhage or extra-axial collection. The size and configuration of the ventricles and extra-axial CSF spaces are normal. There is no acute or chronic infarction. The brain parenchyma is normal. Skull: The visualized skull base, calvarium and extracranial soft tissues are normal. Sinuses/Orbits: No  fluid levels or advanced mucosal thickening of the visualized paranasal sinuses. No mastoid or middle ear effusion. The orbits are normal. CTA HEAD FINDINGS ANTERIOR CIRCULATION: --Intracranial internal carotid arteries: There is severe narrowing of the left internal carotid artery proximal cavernous segment. --Anterior cerebral arteries: Normal. Hypoplastic left A1 segment, normal variant --Middle cerebral arteries: Normal. --Posterior communicating arteries: Absent bilaterally. POSTERIOR CIRCULATION: --Basilar artery: Normal. --Posterior cerebral arteries: Normal. --Superior cerebellar arteries: Normal. --Inferior cerebellar arteries: Normal anterior and posterior inferior cerebellar arteries. VENOUS SINUSES: As permitted by contrast timing, patent. ANATOMIC VARIANTS: None DELAYED PHASE: No parenchymal contrast enhancement. Review of the MIP images confirms the above findings. IMPRESSION: 1. No acute intracranial abnormality. 2. 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 Electronically Signed   By: Ulyses Jarred M.D.    On: 01/09/2018 21:46   Mr Brain Wo Contrast  Result Date: 01/10/2018 CLINICAL DATA:  52 year old male with thunderclap headache. Abnormal jerking movements. Left ICA siphon stenosis on CTA head yesterday. A study without and with contrast was planned but the patient declined IV contrast. EXAM: MRI HEAD WITHOUT CONTRAST TECHNIQUE: Multiplanar, multiecho pulse sequences of the brain and surrounding structures were obtained without intravenous contrast. COMPARISON:  CTA head 01/09/2018. FINDINGS: Brain: Cerebral volume is within normal limits. No restricted diffusion to suggest acute infarction. No midline shift, mass effect, evidence of mass lesion, ventriculomegaly, extra-axial collection or acute intracranial hemorrhage. Cervicomedullary junction and pituitary are within normal limits. Pearline Cables and white matter signal is within normal limits throughout the brain. No encephalomalacia or chronic cerebral blood products identified. Vascular: Major intracranial vascular flow voids are preserved. Skull and upper cervical spine: Negative visible cervical spine. Skull bone marrow signal is normal. Sinuses/Orbits: Orbits soft tissues appear normal. Paranasal sinuses and mastoids are stable and well pneumatized. Other: Grossly normal visible internal auditory structures. The scalp and face soft tissues appear negative. IMPRESSION: Normal noncontrast MRI appearance of the brain. The patient declined IV contrast. Electronically Signed   By: Genevie Ann M.D.   On: 01/10/2018 12:22        Scheduled Meds: . diltiazem  120 mg Oral Daily  . nicotine  14 mg Transdermal Daily  . rivaroxaban  20 mg Oral Q supper   Continuous Infusions: . diltiazem (CARDIZEM) infusion Stopped (01/10/18 1953)     LOS: 1 day    Time spent: 25 minutes.    Dana Allan, MD  [facility] Pager #: [phone] 7PM-7AM contact night coverage as above

## 2018-01-10 NOTE — Care Management Note (Signed)
Case Management Note Marvetta Gibbons RN, BSN Unit 4E- RN Care Coordinator  [phone]  Patient Details  Name: Kevin Mora MRN: [id number] Date of Birth: [birth year]  Subjective/Objective:      Pt admitted with afib              Action/Plan: PTA pt lived at home, referral received for medication needs and PCP. Spoke with pt at bedside- per pt he has been trying to set up PCP with [facility] however they are not taking pt for 2 months. CM will try to get pt into one of the other clinics in am. Per pt he was given scripts from the ED for cardizem and xarelto but unable to fill them due to cost. He is in process of getting orange card through the afib clinic to assist with meds at [facility]. CM can assist with meds if needed with MATCH and with Xarelto 30 day free card- if pt needs Xarelto at discharge will also need to apply for pt assistance. CM will f/u with MD in am for d/c medication needs and further transition of care plans.   Expected Discharge Date:                  Expected Discharge Plan:  Home/Self Care  In-House Referral:     Discharge planning Services  CM Consult, Medication Assistance  Post Acute Care Choice:    Choice offered to:     DME Arranged:    DME Agency:     HH Arranged:    HH Agency:     Status of Service:  In process, will continue to follow  If discussed at Long Length of Stay Meetings, dates discussed:    Additional Comments:  Dawayne Patricia, RN 01/10/2018, 5:13 PM

## 2018-01-10 NOTE — Progress Notes (Signed)
Dr Marthenia Rolling in to see patient w instructions to wean from IV cardizem. Decreased dose to 7.5mg /hr and will continue to monitor.

## 2018-01-11 DIAGNOSIS — I4892 Unspecified atrial flutter: Principal | ICD-10-CM

## 2018-01-11 LAB — RENAL FUNCTION PANEL
Albumin: 3.4 g/dL — ABNORMAL LOW (ref 3.5–5.0)
Anion gap: 4 — ABNORMAL LOW (ref 5–15)
BUN: 8 mg/dL (ref 6–20)
CO2: 26 mmol/L (ref 22–32)
Calcium: 9.5 mg/dL (ref 8.9–10.3)
Chloride: 110 mmol/L (ref 98–111)
Creatinine, Ser: 1.06 mg/dL (ref 0.61–1.24)
GFR calc Af Amer: >60 mL/min (ref >60)
GFR calc non Af Amer: >60 mL/min (ref >60)
Glucose, Bld: 114 mg/dL — ABNORMAL HIGH (ref 70–99)
Phosphorus: 3.2 mg/dL (ref 2.5–4.6)
Potassium: 5.2 mmol/L — ABNORMAL HIGH (ref 3.5–5.1)
Sodium: 140 mmol/L (ref 135–145)

## 2018-01-11 LAB — MAGNESIUM: Magnesium: 2.1 mg/dL (ref 1.7–2.4)

## 2018-01-11 MED ORDER — DILTIAZEM HCL ER COATED BEADS 120 MG PO CP24: 120.0000 mg | ORAL_CAPSULE | Freq: Every day | ORAL | 1 refills | Status: DC

## 2018-01-11 NOTE — Progress Notes (Signed)
[name] to be D/C'd Home per MD order. Discussed with the patient and all questions fully answered.    IV catheter discontinued intact. Site without signs and symptoms of complications. Dressing and pressure applied.  An After Visit Summary was printed and given to the patient.  Patient escorted via [place], and D/C home via private auto.  Cyndra Numbers  01/11/2018 3:18 PM

## 2018-01-11 NOTE — Care Management Note (Signed)
Case Management Note Marvetta Gibbons RN, BSN Unit 4E- RN Care Coordinator  [phone]  Patient Details  Name: Kevin Mora MRN: [id number] Date of Birth: 07/19/1965  Subjective/Objective:      Pt admitted with afib              Action/Plan: PTA pt lived at home, referral received for medication needs and PCP. Spoke with pt at bedside- per pt he has been trying to set up PCP with [facility] however they are not taking pt for 2 months. CM will try to get pt into one of the other clinics in am. Per pt he was given scripts from the ED for cardizem and xarelto but unable to fill them due to cost. He is in process of getting orange card through the afib clinic to assist with meds at [facility]. CM can assist with meds if needed with MATCH and with Xarelto 30 day free card- if pt needs Xarelto at discharge will also need to apply for pt assistance. CM will f/u with MD in am for d/c medication needs and further transition of care plans.   Expected Discharge Date:  01/11/18               Expected Discharge Plan:  Home/Self Care  In-House Referral:     Discharge planning Services  CM Consult, Medication Assistance, Follow-up appt scheduled, [place] [facility]  Post Acute Care Choice:    Choice offered to:     DME Arranged:    DME Agency:     HH Arranged:    [place] Agency:     Status of Service:  Completed, signed off  If discussed at [employer] of Stay Meetings, dates discussed:    Discharge Disposition: home/self care   Additional Comments:  01/11/18- 1430- Marvetta Gibbons RN, CM- pt for discharge home today, spoke with MD- pt will not d/c home on Xarelto- CM has called [facility] and appointment made for 9/17 at 2:30pm- info provided to pt along with [facility] letter to assist with cost of med for discharge- list of pharmacies also provided. Pt wants to use [facility]- also provided pt with info on [facility] pharmacy that pt can use for low cost meds  ($4/$10)  Dahlia Client Romeo Rabon, RN 01/11/2018, 2:35 PM

## 2018-01-11 NOTE — Discharge Summary (Signed)
Physician Discharge Summary  Patient ID: Kevin Mora MRN: [id number] DOB/AGE: 50-Sep-1967 50 y.o.  Admit date: 01/09/2018 Discharge date: 01/11/2018  Admission Diagnoses:  Discharge Diagnoses:  Principal Problem:   Atrial flutter with rapid ventricular response ([place]) Active Problems:   TOBACCO USER   Essential hypertension   Bilateral headaches   Discharged Condition: stable  Hospital Course: Kevin Mora a 52 year old male with past medical history significant foratrial fibrillation and tobacco abuse.  Patient was seen recently at the emergency, and placed on Xarelto and Cardizem, but patient did not take any of the medications due to financial challenges.  Patient was admitted with severe headache and atrial fibrillation with rapid ventricular response.    Patient was admitted for further assessment and management.  MRI of the head came back negative.  Patient was managed with Cardizem drip, and the heart rate is currently controlled.  Patient was eventually transitioned to p.o. Cardizem.  No indication for anticoagulation noted.  Case management assisted with patient's discharge, as patient was financially challenged.  Consults: None  Discharge Exam: Blood pressure (!) 145/102, pulse 91, temperature 98.1 F (36.7 C), temperature source Oral, resp. rate 18, height 5\' 10"  (1.778 m), weight 88.8 kg, SpO2 98 %. 0  Disposition: Discharge disposition: 01-Home or Self Care   Discharge Instructions    Call MD for:   Complete by:  As directed    Please call MD if symptoms worsen   Diet - low sodium heart healthy   Complete by:  As directed    Increase activity slowly   Complete by:  As directed      Allergies as of 01/11/2018   No Known Allergies     Medication List    STOP taking these medications   HYDROcodone-acetaminophen 5-325 MG tablet Commonly known as:  NORCO/VICODIN   naproxen sodium 220 MG tablet Commonly known as:  ALEVE   oxyCODONE-acetaminophen 5-325  MG tablet Commonly known as:  PERCOCET/ROXICET   Rivaroxaban 15 & 20 MG Tbpk     TAKE these medications   diltiazem 120 MG 24 hr capsule Commonly known as:  CARDIZEM CD Take 1 capsule (120 mg total) by mouth daily.      Follow-up Information    [facility]. Go on 02/13/2018.   Why:  F/u appointment at 2:30 - please call if you need to reschedule- bring ID and d/c paperwork- let them know you want to establish care with Primary care.  Contact information: [address] [place] [address] [phone]         33 minutes spent discharging this patient.  SignedBonnell Public 01/11/2018, 12:55 PM

## 2018-01-23 ENCOUNTER — Encounter: Payer: Self-pay | Admitting: Family Medicine

## 2018-01-23 ENCOUNTER — Ambulatory Visit (INDEPENDENT_AMBULATORY_CARE_PROVIDER_SITE_OTHER): Payer: Self-pay | Admitting: Licensed Clinical Social Worker

## 2018-01-23 ENCOUNTER — Ambulatory Visit (INDEPENDENT_AMBULATORY_CARE_PROVIDER_SITE_OTHER): Payer: Self-pay | Admitting: Family Medicine

## 2018-01-23 VITALS — BP 145/92 | HR 62 | Temp 99.0°F | Wt 200.0 lb

## 2018-01-23 DIAGNOSIS — M5442 Lumbago with sciatica, left side: Secondary | ICD-10-CM

## 2018-01-23 DIAGNOSIS — R030 Elevated blood-pressure reading, without diagnosis of hypertension: Secondary | ICD-10-CM

## 2018-01-23 DIAGNOSIS — Z789 Other specified health status: Secondary | ICD-10-CM

## 2018-01-23 DIAGNOSIS — M545 Low back pain: Secondary | ICD-10-CM

## 2018-01-23 DIAGNOSIS — G8929 Other chronic pain: Secondary | ICD-10-CM

## 2018-01-23 DIAGNOSIS — F17200 Nicotine dependence, unspecified, uncomplicated: Secondary | ICD-10-CM

## 2018-01-23 DIAGNOSIS — Z23 Encounter for immunization: Secondary | ICD-10-CM

## 2018-01-23 DIAGNOSIS — I483 Typical atrial flutter: Secondary | ICD-10-CM

## 2018-01-23 NOTE — Patient Instructions (Addendum)
Schedule follow up with me in 4 weeks  Go to the lab today  Go to get your x-ray today    I will see you in four weeks   I will call you with lab results  It was great to meet you today!!   Dorris Singh, MD

## 2018-01-23 NOTE — Assessment & Plan Note (Signed)
The patient has already had a TSH performed.  This is normal we will obtain an echocardiogram.  We will continue to closely monitor his blood pressure as if he develops hypertension he would qualify for anticoagulation.  Referred to cardiology.

## 2018-01-23 NOTE — Progress Notes (Signed)
Integrated Behavioral Health Initial Visit  MRN: [id number] Name: Aneesh Faller Number of [place] Clinician visits:: 1/6 Total time: 15 minutes Type of Service: [place] Interpretor:No. Interpretor Name and Language: NA  Warm Hand Off Completed.      SUBJECTIVE: Rudy Luhmann is a 52 y.o. male Patient was referred by Dr. Owens Shark for possible food insecurities. 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   INTERVENTIONS: Interventions utilized: Solution-Focused Strategies and Link to [employer]  Standardized Assessments completed: Not Needed ASSESSMENT: Patient was experiencing difficulty with food, states this is no longer a concern as he now has food benefits and is working a few days a week.  LCSW reviewed with patient community resources that will assist if his food does not last all month.  PLAN: Referral(s): Community Resources:  Biomedical scientist.  Patient was appreciative of information and will utilize these resources if needed.  Maurine Cane, LCSW Casimer Lanius, LCSW Licensed Clinical Social Worker [place]   [phone] 9:44 AM

## 2018-01-23 NOTE — Assessment & Plan Note (Signed)
Recommended pneumonia vaccine as well as Tdap.  He he declined pneumonia vaccine.

## 2018-01-23 NOTE — Progress Notes (Signed)
Patient Name: Kevin Mora Date of Birth: 25-Aug-1965 Date of Visit: 01/23/18 PCP: Martyn Malay, MD  Chief Complaint: back pain, leg pain   Subjective: Kevin Mora is a pleasant 52 y.o. year old with medical history significant for atrial flutter, tobacco abuse, and chronic back pain presenting to establish care and discuss his back pain.  Mr. Kevin Mora reports his primary complaint today is back pain and bilateral lower extremity pain.  He believes this is due to sciatic nerve injury.  He reports that he has had 3 years of intermittent low back pain.  The pain is worsened after walking 1-2 blocks.  When he walks the pain is located in his anterior lower extremities as well as his calves and sometimes his thighs.  He does have chronic low back pain as well that is separate and distinct from this.  The leg pain has been ongoing for 3 months.  He denies ulceration change in color of his lower extremities.  He does endorse decreased hair on his anterior shins.  He specifically denies fevers, weight loss, bladder or bowel incontinence, lower extremity numbness or weakness.He has tried several CBD products for his symptoms. He wishes to avoid surgery and medications.   In regards to his atrial flutter Mr. Baena denies palpitations,chest pain,  or dyspnea on exertion.  He denies dizziness upon standing.  His work-up for his atrial flutter has included a TSH which was normal.  He has not had an echocardiogram.He is taking Diltiazem as prescribed.   Past medical history: There is a documented history at one time hypertension.  He specifically denies any history of hypertension, diabetes, vascular disease, heart failure. Atrial flutter Tobacco abuse  Past surgical history: None pertinent  Social history: He works intermittently at a recycling center currently he previously worked at [employer] job where he was on his feet at all times.  He smokes each day up to a pack.  He denies excessive alcohol  use he does intermittently use marijuana.  He drinks 2 alcoholic beverages a day.  He lives with his mother.  He is planning to apply for disability due to his back pain.  Family history: His mother has a history of hypertension and diabetes.  His father died of a heart attack, uncertain age.  ROS:  ROS Negative for headaches, vision changes, chest pain, dyspnea on exertion, orthopnea, paroxysmal nocturnal dyspnea, lower extremity edema, palpitations  I have reviewed the patient's medical, surgical, family, and social history as appropriate.   Vitals:   01/23/18 0836  BP: (!) 145/92  Pulse: 62  Temp: 99 F (37.2 C)  SpO2: 98%   Filed Weights   01/23/18 0836  Weight: 200 lb (90.7 kg)   HEENT: Sclera anicteric. Dentition is moderate. Appears well hydrated. Neck: Supple Cardiac: irregular rate and rhythm. Normal S1/S2. No murmurs, rubs, or gallops appreciated. Lungs: Clear bilaterally to ascultation.  Abdomen: Normoactive bowel sounds. No tenderness to deep or light palpation. No rebound or guarding.  Extremities: Warm, well perfused without edema.  Skin: Warm, dry Psych: Pleasant and appropriate  MSK  Gait is within normal limits.  Back exam notable for no skin or gross deformity.  Tenderness to palpation along bilateral sacroiliac joints.  He has a negative straight leg raise bilaterally positive FABER test on the left.  He does have reduced hair on his anterior bilateral shins.  He has weak dorsalis pedis pulses.  There are no other skin changes on his lower extremities.  He has downgoing  toes bilaterally.   Jerrie was seen today for establish care.  Diagnoses and all orders for this visit:  Atrial flutter, the patient is  asymptomatic from an atrial flutter standpoint today. His CHADS2VASC  This is with the consideration that he does not have hypertension.  He has no documented prior elevated readings of blood pressure today his initial blood pressure reading was slightly  elevated but returned to normal on repeat.  He has no documented history of vascular disease, hypertension, heart failure, stroke/TIA or diabetes. -     ECHOCARDIOGRAM COMPLETE; Future -     Hemoglobin A1c -     Lipid Panel -     Ambulatory referral to Cardiology - Continue aspirin. Should his blood pressure become elevated, recommend beginning therapy with Xeralto or Apixaban.   Chronic bilateral low back pain with left-sided sciatica differential for his back pain include sacroiliac disease, degenerative disc disease, or spinal stenosis.  I do suspect that his lower extremity pain that he gets when he walks is actually due to peripheral vascular disease rather than degenerative disc disease spinal stenosis may also be contributing. -     DG Lumbar Spine Complete; Future -     Ambulatory referral to Orthopedic Surgery -     Ambulatory referral to Physical Therapy - Plan for ABI at next visit.   Elevated blood pressure reading repeat within normal limits.  Would consider ambulatory monitoring in the future.  At follow up  At follow-up visit the patient needs a Tdap vaccine.  He left before this is administered today as he is still in the process of applying for financial assistance.  He declined PCV 23 and a flu shot today.  He is interested in a colonoscopy in the future he declines today.  He will qualify for screening lung CT at age 62.  If still smoking at 56 he will also qualify for a one-time AAA screening.   Dorris Singh, MD  Family Medicine Teaching Service      Dorris Singh, MD  Family Medicine Teaching Service

## 2018-01-30 ENCOUNTER — Ambulatory Visit (HOSPITAL_COMMUNITY)
Admission: RE | Admit: 2018-01-30 | Discharge: 2018-01-30 | Disposition: A | Discharge status: Home / Self Care | Payer: Self-pay | Source: Ambulatory Visit | Attending: Family Medicine | Admitting: Family Medicine

## 2018-01-30 DIAGNOSIS — I483 Typical atrial flutter: Secondary | ICD-10-CM

## 2018-01-30 DIAGNOSIS — Z720 Tobacco use: Secondary | ICD-10-CM | POA: Insufficient documentation

## 2018-01-30 DIAGNOSIS — I340 Nonrheumatic mitral (valve) insufficiency: Secondary | ICD-10-CM | POA: Insufficient documentation

## 2018-01-30 DIAGNOSIS — I5189 Other ill-defined heart diseases: Secondary | ICD-10-CM

## 2018-01-30 NOTE — Progress Notes (Signed)
  Echocardiogram 2D Echocardiogram has been performed.  Jennette Dubin 01/30/2018, 8:50 AM

## 2018-01-31 ENCOUNTER — Telehealth: Payer: Self-pay | Admitting: Family Medicine

## 2018-01-31 DIAGNOSIS — I483 Typical atrial flutter: Secondary | ICD-10-CM

## 2018-01-31 MED ORDER — METOPROLOL SUCCINATE ER 25 MG PO TB24: 25.0000 mg | ORAL_TABLET | Freq: Every day | ORAL | 3 refills | Status: DC

## 2018-01-31 NOTE — Telephone Encounter (Signed)
Called patient regarding echocardiogram. Echocardiogram shows EF 40-45%, diffuse hypokinesis, dilation of RA and LA.  Currently, he is taking diltiazem for rate control. He denies symptoms of atrial flutter/fibrillation. He is not on anticoagulation---this was started at initial ED visit (Rivaroxaban) then stopped as CHADS2VASC thought to be 0 at time of discharge from hospital. With reduced EF, CHADS2VASC =1 which indicates need for anticoagulation. Additionally, need to transition diltiazem to preferred beta-blocker, such as metoprolol XL    Discussed need for anticoagulation with patient. Discussed risk of stroke, risk of bleeding, side effect profile of DOACs. Reviewed signs and symptoms of stroke. Patient reports he is still in process of applying for insurance/assistance and is unable to afford DOAC and/or warfarin monitoring. Discussed to need to transition to Metoprolol or other beta-blocker. Referral to Cardiology previously placed, as I believe he needs an ischemic evaluation. Risk factors for atrial flutter/fibrillation include tobacco abuse, suspect dyslipidemia (not recent lipid panel, this has been ordered) and possible HTN. TSH within normal limits. Magnesium and electrolytes were within normal limits during hosptial stay.   Will reach out to staff for assistance with insurance/orange card.   Dorris Singh, MD  Family Medicine Teaching Service

## 2018-01-31 NOTE — Telephone Encounter (Signed)
Called patient regarding medication changes. He applied for [employer], will use [place].  STOP diltiazem. Instructed patient to start metoprolol XL 25 mg the next day. Reviewed return precautions, side effects. Patient repeated back instructions.   Discussed anticoagulation. Patient does not yet have [employer]. Will take aspirin 81 mg until he has this. Will plan to transition to [place] once he has [employer].  All questions were answered. Reviewed signs and symptoms to call clinic and present to ED including dizziness, low HR, sustained elevated HR >120, chest pain.

## 2018-02-06 ENCOUNTER — Encounter (INDEPENDENT_AMBULATORY_CARE_PROVIDER_SITE_OTHER): Payer: Self-pay | Admitting: Orthopaedic Surgery

## 2018-02-06 ENCOUNTER — Ambulatory Visit (INDEPENDENT_AMBULATORY_CARE_PROVIDER_SITE_OTHER): Payer: Self-pay

## 2018-02-06 ENCOUNTER — Ambulatory Visit (INDEPENDENT_AMBULATORY_CARE_PROVIDER_SITE_OTHER): Payer: Self-pay | Admitting: Orthopaedic Surgery

## 2018-02-06 VITALS — Ht 70.0 in | Wt 205.0 lb

## 2018-02-06 DIAGNOSIS — M545 Low back pain: Secondary | ICD-10-CM

## 2018-02-06 DIAGNOSIS — G8929 Other chronic pain: Secondary | ICD-10-CM

## 2018-02-06 MED ORDER — METHOCARBAMOL 500 MG PO TABS: 500.0000 mg | ORAL_TABLET | Freq: Every day | ORAL | 0 refills | Status: DC | PRN

## 2018-02-06 MED ORDER — PREDNISONE 10 MG (21) PO TBPK: ORAL_TABLET | ORAL | 0 refills | Status: DC

## 2018-02-06 NOTE — Progress Notes (Signed)
Office Visit Note   Patient: Kevin Mora           Date of Birth: August 29, 1965           MRN: [id number] Visit Date: 02/06/2018              Requested by: Martyn Malay, MD [address] [place], [place] [address] PCP: Martyn Malay, MD   Assessment & Plan: Visit Diagnoses:  1. Chronic left-sided low back pain, with sciatica presence unspecified     Plan: Impression is left-sided lumbar pain versus hip pathology.  We will send the patient to formal physical therapy as well as call in a Sterapred taper and muscle relaxer.  He will follow-up with [place] in 8 weeks time for recheck.  At that point, we will further assess the patient where we may refer him to Dr. Junius Roads or Ernestina Patches for a diagnostic and possibly therapeutic intra-articular cortisone injection to the left hip joint.  Follow-Up Instructions: Return in about 8 weeks (around 04/03/2018).   Orders:  Orders Placed This Encounter  Procedures  . XR Lumbar Spine 2-3 Views   Meds ordered this encounter  Medications  . DISCONTD: methocarbamol (ROBAXIN) 500 MG tablet    Sig: Take 1 tablet (500 mg total) by mouth daily as needed for muscle spasms.    Dispense:  20 tablet    Refill:  0  . predniSONE (STERAPRED UNI-PAK 21 TAB) 10 MG (21) TBPK tablet    Sig: Take as directed    Dispense:  21 tablet    Refill:  0  . methocarbamol (ROBAXIN) 500 MG tablet    Sig: Take 1 tablet (500 mg total) by mouth daily as needed for muscle spasms.    Dispense:  20 tablet    Refill:  0      Procedures: No procedures performed   Clinical Data: No additional findings.   Subjective: Chief Complaint  Patient presents with  . Lower Back - Pain    HPI patient is a pleasant 52 year old gentleman who presents to our clinic today with left-sided lower back pain.  This began about 1 to 1/2 years ago without any known injury or change in activity.  The pain he has goes down both legs and into both feet.  He also notes occasional pain to the left  groin area.  He sometimes notes a pulling sensation as well.  Pain appears worse with lumbar flexion, extension as well as when walking.  He denies any numbness, tingling or burning to either lower extremity.  No previous history of back or hip pathology.  No bowel or bladder change and no saddle paresthesias.  Review of Systems as detailed in HPI.  All others reviewed and are negative.   Objective: Vital Signs: Ht 5\' 10"  (1.778 m)   Wt 205 lb (93 kg)   BMI 29.41 kg/m   Physical Exam well-developed well-nourished gentleman in no acute distress.  Alert and oriented x3.  Ortho Exam examination of the lumbar spine reveals no spinous tenderness.  He does have slight paraspinous tenderness on the left.  Tenderness to the left SI joint.  Negative logroll and negative straight leg raise.  Negative Fater.  Full strength.  He is neurovascularly intact distally.  Specialty Comments:  No specialty comments available.  Imaging: Xr Lumbar Spine 2-3 Views  Result Date: 02/06/2018 No acute or structural abnormalities.  Slight decreased joint space left hip.    PMFS History: Patient Active Problem List  Diagnosis Date Noted  . Chronic low back pain 01/23/2018  . Atrial flutter with rapid ventricular response ([place]) 01/09/2018  . Bilateral headaches 01/09/2018  . TOBACCO USER 03/07/2009   Past Medical History:  Diagnosis Date  . Atrial flutter ([place]) 12/2017  . Tobacco abuse     Family History  Problem Relation Age of Onset  . Diabetes Mother   . Hypertension Mother   . Coronary artery disease Father     Past Surgical History:  Procedure Laterality Date  . APPENDECTOMY    . LIPOMA EXCISION     right shoulder  . WISDOM TOOTH EXTRACTION     Social History   Occupational History  . Not on file  Tobacco Use  . Smoking status: Current Every Day Smoker    Packs/day: 0.50    Years: 30.00    Pack years: 15.00    Types: Cigarettes  . Smokeless tobacco: Never Used  Substance and  Sexual Activity  . Alcohol use: Yes    Comment: Occasional   . Drug use: Yes    Types: Marijuana  . Sexual activity: Yes    Partners: Male

## 2018-02-08 ENCOUNTER — Telehealth: Payer: Self-pay | Admitting: Family Medicine

## 2018-02-08 NOTE — Telephone Encounter (Signed)
Called patient to check in regarding medication adherence/cost. Has not yet picked up metoprolol. Reports he is meeting with Kennyth Lose (financial aid) today. Will obtain new medication after that time.   Dorris Singh, MD  Family Medicine Teaching Service

## 2018-02-13 ENCOUNTER — Inpatient Hospital Stay (INDEPENDENT_AMBULATORY_CARE_PROVIDER_SITE_OTHER): Payer: Self-pay | Admitting: Physician Assistant

## 2018-02-19 ENCOUNTER — Other Ambulatory Visit: Payer: Self-pay

## 2018-02-19 MED ORDER — DILTIAZEM HCL ER COATED BEADS 120 MG PO CP24: 120.0000 mg | ORAL_CAPSULE | Freq: Every day | ORAL | 5 refills | Status: DC

## 2018-02-19 NOTE — Telephone Encounter (Signed)
Patient should be taking Metoprolol you please call to confirm he switched?

## 2018-02-19 NOTE — Telephone Encounter (Signed)
Kevin Mora- Can you please reach out to the patient to schedule a financial counseling session?>  Dorris Singh, MD  [facility] Medicine Teaching Service

## 2018-02-19 NOTE — Telephone Encounter (Signed)
Pt stated that he is taking the cardizem until he gets his orange card to cover the metoprolol. Also he said he was given a letter to get a discount on the medication and the pharmacy said it expired. Kevin Mora, CMA

## 2018-03-09 ENCOUNTER — Ambulatory Visit: Payer: Self-pay | Admitting: Family Medicine

## 2018-03-13 ENCOUNTER — Ambulatory Visit: Payer: Self-pay | Admitting: Internal Medicine

## 2018-04-02 ENCOUNTER — Ambulatory Visit: Payer: Self-pay | Admitting: Internal Medicine

## 2018-04-03 ENCOUNTER — Ambulatory Visit (INDEPENDENT_AMBULATORY_CARE_PROVIDER_SITE_OTHER): Payer: Self-pay | Admitting: Orthopaedic Surgery

## 2018-04-09 ENCOUNTER — Ambulatory Visit: Payer: Self-pay | Admitting: Family Medicine

## 2018-09-26 ENCOUNTER — Telehealth: Payer: Self-pay

## 2018-09-26 NOTE — Telephone Encounter (Signed)
Attempted to call patient to inquire as to whether he is a patient.  There was no answer and no voice mail.  Ozella Almond, [place]

## 2018-11-14 ENCOUNTER — Other Ambulatory Visit: Payer: Self-pay

## 2018-11-14 ENCOUNTER — Emergency Department (HOSPITAL_COMMUNITY)
Admission: EM | Admit: 2018-11-14 | Discharge: 2018-11-15 | Disposition: A | Discharge status: Home / Self Care | Payer: Self-pay | Attending: Emergency Medicine | Admitting: Emergency Medicine

## 2018-11-14 ENCOUNTER — Encounter (HOSPITAL_COMMUNITY): Payer: Self-pay | Admitting: Emergency Medicine

## 2018-11-14 DIAGNOSIS — F17210 Nicotine dependence, cigarettes, uncomplicated: Secondary | ICD-10-CM | POA: Insufficient documentation

## 2018-11-14 DIAGNOSIS — M5432 Sciatica, left side: Secondary | ICD-10-CM | POA: Insufficient documentation

## 2018-11-14 DIAGNOSIS — Z7982 Long term (current) use of aspirin: Secondary | ICD-10-CM | POA: Insufficient documentation

## 2018-11-14 DIAGNOSIS — R109 Unspecified abdominal pain: Secondary | ICD-10-CM | POA: Insufficient documentation

## 2018-11-14 LAB — CBC
HCT: 43.9 % (ref 39.0–52.0)
Hemoglobin: 15.1 g/dL (ref 13.0–17.0)
MCH: 33.2 pg (ref 26.0–34.0)
MCHC: 34.4 g/dL (ref 30.0–36.0)
MCV: 96.5 fL (ref 80.0–100.0)
Platelets: 155 10*3/uL (ref 150–400)
RBC: 4.55 MIL/uL (ref 4.22–5.81)
RDW: 13.1 % (ref 11.5–15.5)
WBC: 5.4 10*3/uL (ref 4.0–10.5)
nRBC: 0 % (ref 0.0–0.2)

## 2018-11-14 LAB — URINALYSIS, ROUTINE W REFLEX MICROSCOPIC
Bilirubin Urine: NEGATIVE
Glucose, UA: NEGATIVE mg/dL
Hgb urine dipstick: NEGATIVE
Ketones, ur: NEGATIVE mg/dL
Leukocytes,Ua: NEGATIVE
Nitrite: NEGATIVE
Protein, ur: NEGATIVE mg/dL
Specific Gravity, Urine: 1.024 (ref 1.005–1.030)
pH: 5 (ref 5.0–8.0)

## 2018-11-14 LAB — BASIC METABOLIC PANEL
Anion gap: 10 (ref 5–15)
BUN: 10 mg/dL (ref 6–20)
CO2: 20 mmol/L — ABNORMAL LOW (ref 22–32)
Calcium: 9.3 mg/dL (ref 8.9–10.3)
Chloride: 105 mmol/L (ref 98–111)
Creatinine, Ser: 0.94 mg/dL (ref 0.61–1.24)
GFR calc Af Amer: >60 mL/min (ref >60)
GFR calc non Af Amer: >60 mL/min (ref >60)
Glucose, Bld: 122 mg/dL — ABNORMAL HIGH (ref 70–99)
Potassium: 3.8 mmol/L (ref 3.5–5.1)
Sodium: 135 mmol/L (ref 135–145)

## 2018-11-14 NOTE — ED Triage Notes (Signed)
Pt in POV reporting L flank pain X1 day, also reports N/V. States he also has a "burning" sensation in his stomach.

## 2018-11-15 ENCOUNTER — Emergency Department (HOSPITAL_COMMUNITY): Payer: Self-pay

## 2018-11-15 MED ORDER — METHOCARBAMOL 500 MG PO TABS
500.0000 mg | ORAL_TABLET | Freq: Once | ORAL | Status: AC
  Administered 2018-11-15: 500 mg via ORAL
  Filled 2018-11-15: qty 1

## 2018-11-15 MED ORDER — PREDNISONE 20 MG PO TABS: ORAL_TABLET | ORAL | 0 refills | Status: DC

## 2018-11-15 MED ORDER — METHOCARBAMOL 500 MG PO TABS: 500.0000 mg | ORAL_TABLET | Freq: Three times a day (TID) | ORAL | 0 refills | Status: DC | PRN

## 2018-11-15 MED ORDER — OXYCODONE-ACETAMINOPHEN 5-325 MG PO TABS
1.0000 | ORAL_TABLET | Freq: Once | ORAL | Status: AC
  Administered 2018-11-15: 1 via ORAL
  Filled 2018-11-15: qty 1

## 2018-11-15 MED ORDER — METHYLPREDNISOLONE SODIUM SUCC 125 MG IJ SOLR
125.0000 mg | Freq: Once | INTRAMUSCULAR | Status: AC
  Administered 2018-11-15: 125 mg via INTRAMUSCULAR
  Filled 2018-11-15: qty 2

## 2018-11-15 MED ORDER — OXYCODONE-ACETAMINOPHEN 5-325 MG PO TABS: 1.0000 | ORAL_TABLET | Freq: Four times a day (QID) | ORAL | 0 refills | Status: DC | PRN

## 2018-11-15 NOTE — ED Notes (Signed)
Pt discharged from ED; instructions provided and scripts given; Pt encouraged to return to ED if symptoms worsen and to f/u with PCP; Pt verbalized understanding of all instructions 

## 2018-11-15 NOTE — ED Provider Notes (Signed)
[facility] EMERGENCY DEPARTMENT Provider Note   CSN: [id number] Arrival date & time: 11/14/18  2044     History   Chief Complaint Chief Complaint  Patient presents with   Flank Pain    HPI Kevin Mora is a 53 y.o. male.      Flank Pain This is a recurrent problem. The problem occurs constantly. The problem has been gradually improving. Pertinent negatives include no chest pain. Nothing aggravates the symptoms. Nothing relieves the symptoms. He has tried nothing for the symptoms.    Past Medical History:  Diagnosis Date   Atrial flutter ([place]) 12/2017   Tobacco abuse     Patient Active Problem List   Diagnosis Date Noted   Chronic low back pain 01/23/2018   Atrial flutter with rapid ventricular response ([place]) 01/09/2018   Bilateral headaches 01/09/2018   TOBACCO USER 03/07/2009    Past Surgical History:  Procedure Laterality Date   APPENDECTOMY     LIPOMA EXCISION     right shoulder   WISDOM TOOTH EXTRACTION          Home Medications    Prior to Admission medications   Medication Sig Start Date End Date Taking? Authorizing Provider  acetaminophen (TYLENOL) 500 MG tablet Take 1,000 mg by mouth every 6 (six) hours as needed for mild pain.   Yes [provider]  aspirin EC 81 MG tablet Take 81 mg by mouth daily.   Yes [provider]  methocarbamol (ROBAXIN) 500 MG tablet Take 1 tablet (500 mg total) by mouth every 8 (eight) hours as needed for muscle spasms. 11/15/18   Emmaly Leech, Corene Cornea, MD  oxyCODONE-acetaminophen (PERCOCET) 5-325 MG tablet Take 1 tablet by mouth every 6 (six) hours as needed for severe pain. 11/15/18   Kimberlynn Lumbra, Corene Cornea, MD  predniSONE (DELTASONE) 20 MG tablet 3 tabs po daily x 3 days, then 2 tabs x 3 days, then 1.5 tabs x 3 days, then 1 tab x 3 days, then 0.5 tabs x 3 days 11/15/18   Kentrell Guettler, Corene Cornea, MD  diltiazem (CARDIZEM CD) 120 MG 24 hr capsule Take 1 capsule (120 mg total) by mouth daily. Patient not  taking: Reported on 11/15/2018 02/19/18 11/15/18  Martyn Malay, MD  metoprolol succinate (TOPROL-XL) 25 MG 24 hr tablet Take 1 tablet (25 mg total) by mouth daily. Patient not taking: Reported on 02/06/2018 01/31/18 11/15/18  Martyn Malay, MD    Family History Family History  Problem Relation Age of Onset   Diabetes Mother    Hypertension Mother    Coronary artery disease Father     Social History Social History   Tobacco Use   Smoking status: Current Every Day Smoker    Packs/day: 0.50    Years: 30.00    Pack years: 15.00    Types: Cigarettes   Smokeless tobacco: Never Used  Substance Use Topics   Alcohol use: Yes    Comment: Socially    Drug use: Yes    Types: Marijuana     Allergies   Patient has no known allergies.   Review of Systems Review of Systems  Cardiovascular: Negative for chest pain.  Genitourinary: Positive for flank pain.  All other systems reviewed and are negative.    Physical Exam Updated Vital Signs BP (!) 161/120    Pulse (!) 121    Temp 97.8 F (36.6 C) (Oral)    Resp 16    Ht 5\' 10"  (1.778 m)    Wt 90.7  kg    SpO2 96%    BMI 28.70 kg/m   Physical Exam Vitals signs and nursing note reviewed.  Constitutional:      Appearance: He is well-developed.  HENT:     Head: Normocephalic and atraumatic.     Mouth/Throat:     Mouth: Mucous membranes are dry.     Pharynx: Oropharynx is clear.  Eyes:     Extraocular Movements: Extraocular movements intact.     Conjunctiva/sclera: Conjunctivae normal.  Neck:     Musculoskeletal: Normal range of motion.  Cardiovascular:     Rate and Rhythm: Normal rate.  Pulmonary:     Effort: Pulmonary effort is normal. No respiratory distress.  Abdominal:     General: There is no distension.  Musculoskeletal: Normal range of motion.        General: No swelling or tenderness.  Skin:    General: Skin is warm and dry.  Neurological:     General: No focal deficit present.     Mental Status: He is  alert.     Cranial Nerves: No cranial nerve deficit.     Motor: No weakness.      ED Treatments / Results  Labs (all labs ordered are listed, but only abnormal results are displayed) Labs Reviewed  BASIC METABOLIC PANEL - Abnormal; Notable for the following components:      Result Value   CO2 20 (*)    Glucose, Bld 122 (*)    All other components within normal limits  URINALYSIS, ROUTINE W REFLEX MICROSCOPIC  CBC    EKG    Radiology Ct Renal Stone Study  Result Date: 11/15/2018 CLINICAL DATA:  Left flank pain. Nausea and vomiting. EXAM: CT ABDOMEN AND PELVIS WITHOUT CONTRAST TECHNIQUE: Multidetector CT imaging of the abdomen and pelvis was performed following the standard protocol without IV contrast. COMPARISON:  None. FINDINGS: Lower chest: Bilateral lower lobe opacities have a linear configuration on coronal and sagittal reformats and are most consistent with atelectasis. Trace pericardial effusion. Hepatobiliary: Layering hyperdensity in the gallbladder. No pericholecystic inflammation. No focal hepatic abnormality on noncontrast exam. No biliary dilatation. Pancreas: No ductal dilatation or inflammation. Spleen: Normal in size without focal abnormality. Adrenals/Urinary Tract: Normal adrenal glands. No renal stones or hydronephrosis. No significant perinephric edema. Both ureters are decompressed without stone along the course. Urinary bladder is partially distended. No bladder stone. No bladder wall thickening. Stomach/Bowel: Bowel evaluation is limited in the absence of enteric contrast. Fluid within the stomach. No obvious gastric wall thickening. Normal positioning of the ligament of Treitz. No bowel obstruction or inflammation. Appendix is not visualized, prior appendectomy per history. Small volume of colonic stool. No colonic wall thickening or inflammation. Vascular/Lymphatic: Mild aorta bi-iliac atherosclerosis. No aneurysm. No enlarged lymph nodes in the abdomen or pelvis.  Reproductive: Prostate is unremarkable. Other: No free air or free fluid. Tiny fat containing umbilical hernia. Musculoskeletal: Lower lumbar facet degeneration. There are no acute or suspicious osseous abnormalities. IMPRESSION: 1. No renal stones or obstructive uropathy. No acute abnormality in the abdomen/pelvis. 2. Layering hyperdensity in the gallbladder, likely combination of sludge and stones. No pericholecystic inflammation. 3.  Aortic Atherosclerosis (ICD10-I70.0). Electronically Signed   By: Keith Rake M.D.   On: 11/15/2018 03:13    Procedures Procedures (including critical care time)  Medications Ordered in ED Medications  methocarbamol (ROBAXIN) tablet 500 mg (500 mg Oral Given 11/15/18 0310)  methylPREDNISolone sodium succinate (SOLU-MEDROL) 125 mg/2 mL injection 125 mg (125 mg Intramuscular Given  11/15/18 0310)  oxyCODONE-acetaminophen (PERCOCET/ROXICET) 5-325 MG per tablet 1 tablet (1 tablet Oral Given 11/15/18 0310)     Initial Impression / Assessment and Plan / ED Course  I have reviewed the triage vital signs and the nursing notes.  Pertinent labs & imaging results that were available during my care of the patient were reviewed by me and considered in my medical decision making (see chart for details).        Patient with an exacerbation of his sciatica which he is used to but also has left-sided flank pain which he feels is new.  Little hypertensive but this did improve with pain medication.  Considered aortic dissection but unlikely at this time without neurologic complaints and normal CT scan.  No evidence of kidney stone either.  No evidence of new back injuries.  Treated for sciatica with significant improvement.  Will follow with PCP if not continuing to improve appropriately.  Apparently at time of discharge they documented a heart rate of 121.  My last evaluation his heart rate was below 100.  Not sure if this was an error or not.  Final Clinical Impressions(s)  / ED Diagnoses   Final diagnoses:  Left flank pain  Sciatica of left side    ED Discharge Orders         Ordered    predniSONE (DELTASONE) 20 MG tablet     11/15/18 0411    methocarbamol (ROBAXIN) 500 MG tablet  Every 8 hours PRN,   Status:  Discontinued     11/15/18 0411    methocarbamol (ROBAXIN) 500 MG tablet  Every 8 hours PRN     11/15/18 0416    oxyCODONE-acetaminophen (PERCOCET) 5-325 MG tablet  Every 6 hours PRN     11/15/18 0417           Aasia Peavler, Corene Cornea, MD 11/15/18 [phone]

## 2019-06-23 IMAGING — CT CT RENAL STONE PROTOCOL
2 of 4 series · 16 of 46 positions shown, 18 images · non-contrast
Comparison: None.

CLINICAL DATA: Left flank pain. Nausea and vomiting.

EXAM:
CT ABDOMEN AND PELVIS WITHOUT CONTRAST
TECHNIQUE: Multidetector CT imaging of the abdomen and pelvis was performed
following the standard protocol without IV contrast.

[Series 3: ap without · axial · non-contrast · 0.80mm/px · z∈[+868,+1318]mm · 13 of 102 slices shown, 15 images]
[im 6/102  soft-tissue]
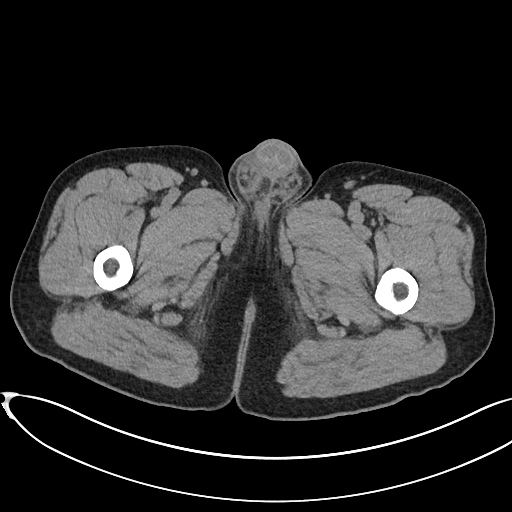
[im 6/102  bone]
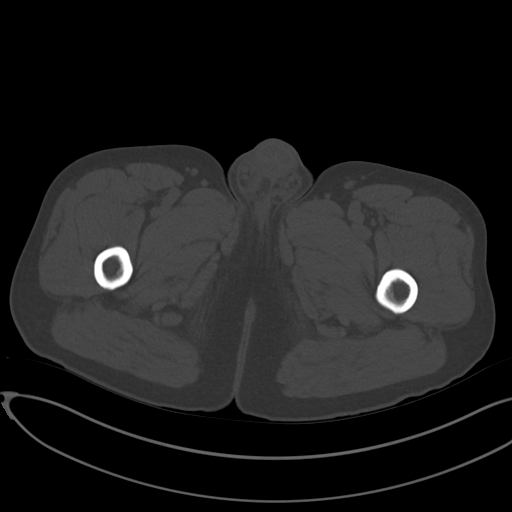
[im 16/102  soft-tissue]
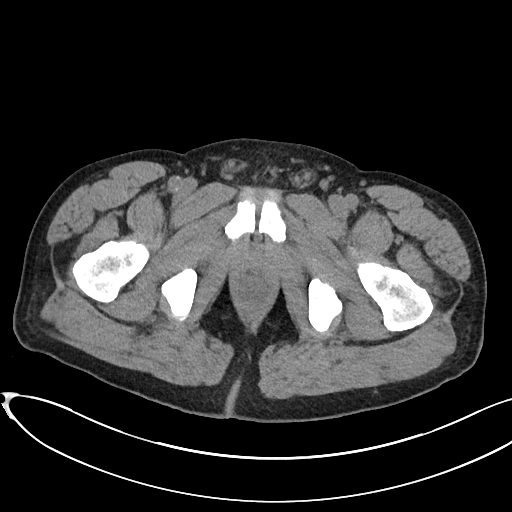
[im 21/102  soft-tissue]
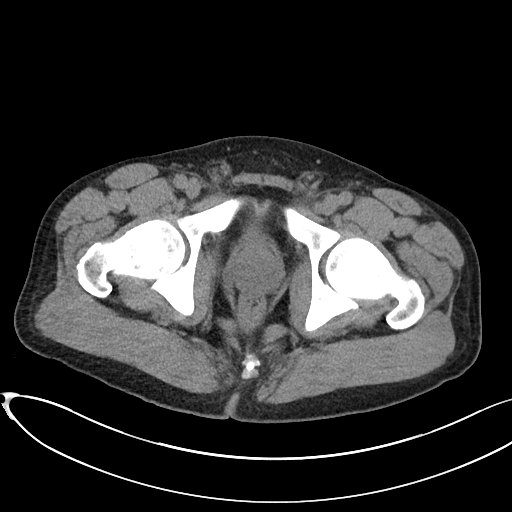
[im 31/102  soft-tissue]
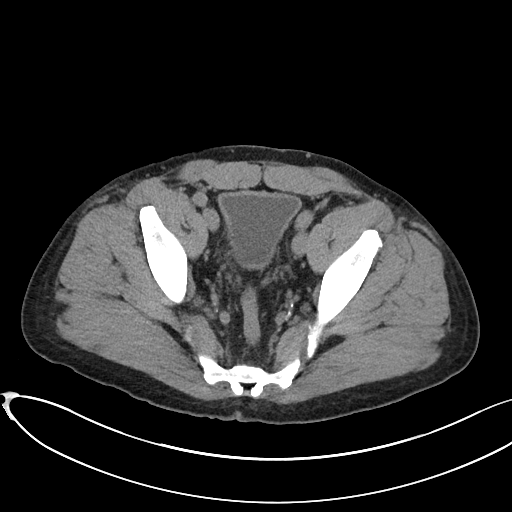
[im 36/102  soft-tissue]
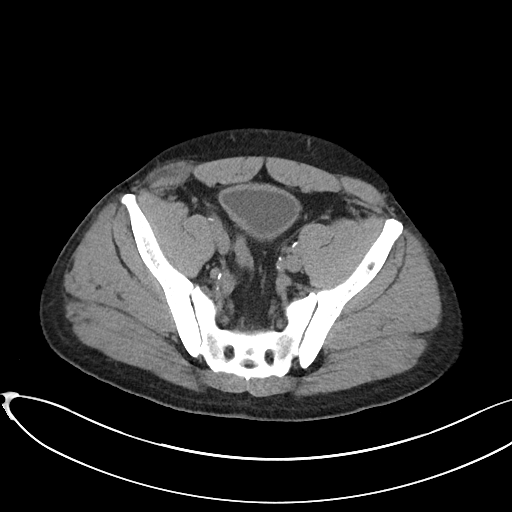
[im 46/102  soft-tissue]
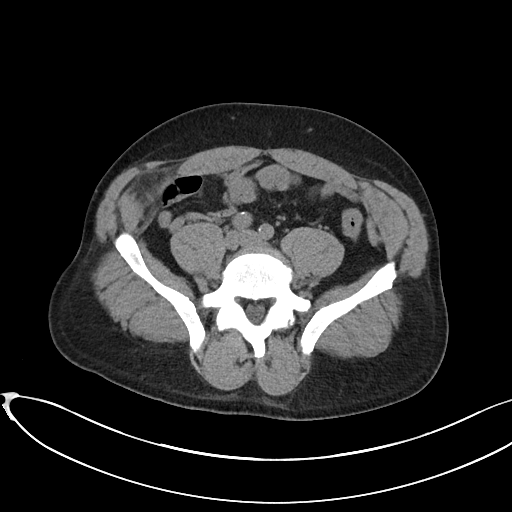
[im 51/102  soft-tissue]
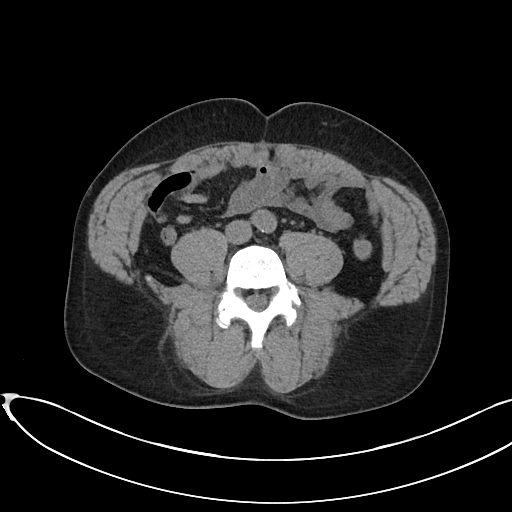
[im 56/102  soft-tissue]
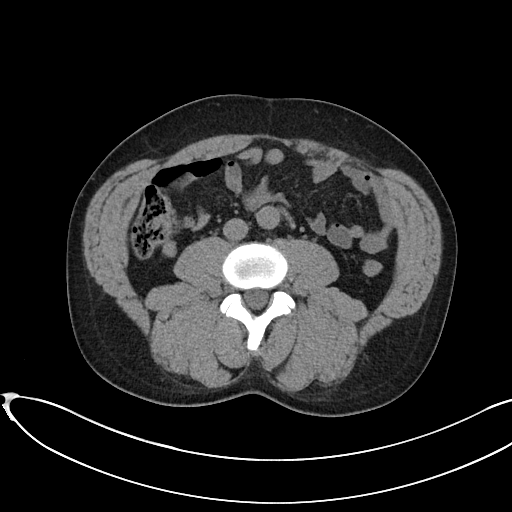
[im 66/102  soft-tissue]
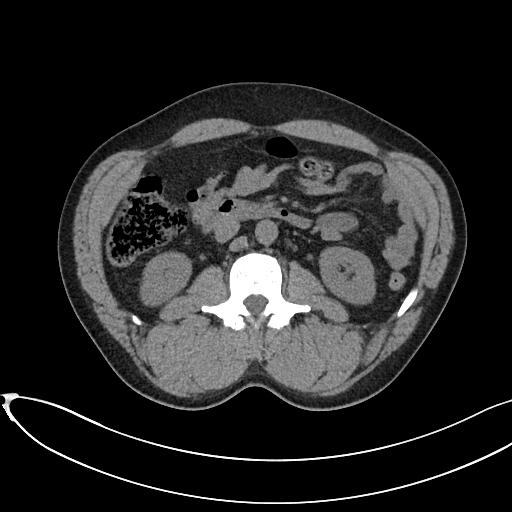
[im 66/102  bone]
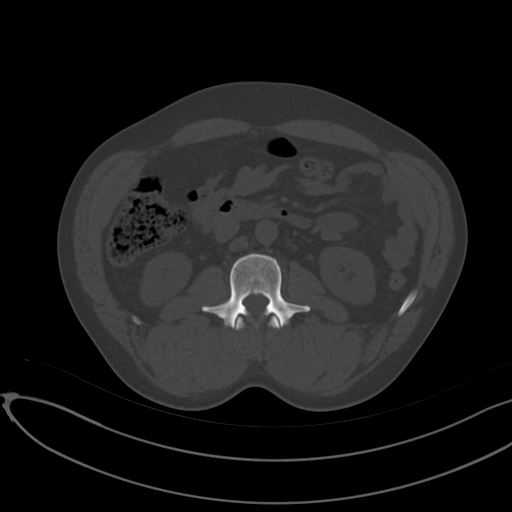
[im 71/102  soft-tissue]
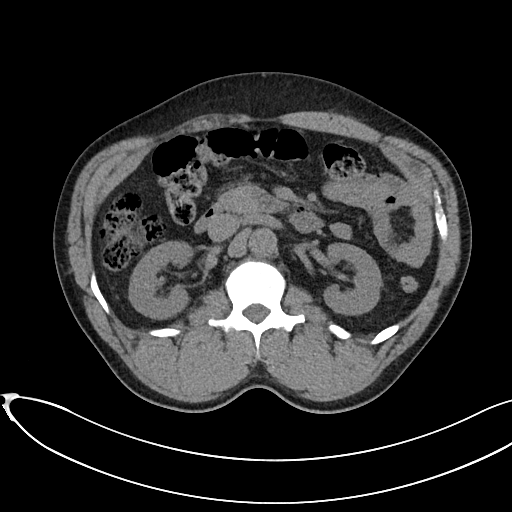
[im 81/102  soft-tissue]
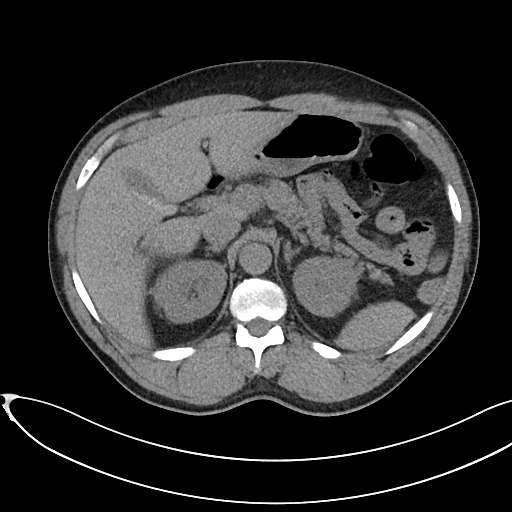
[im 86/102  soft-tissue]
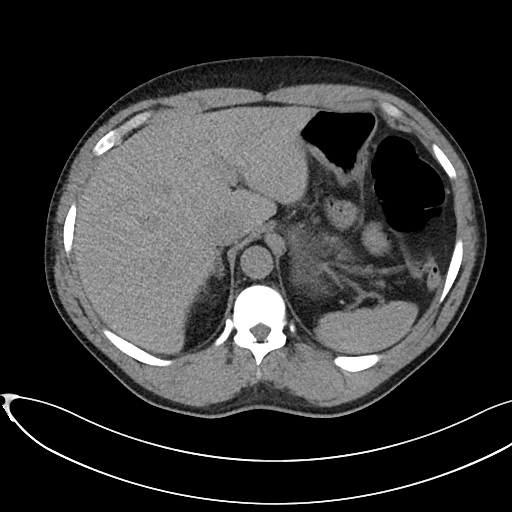
[im 96/102  soft-tissue]
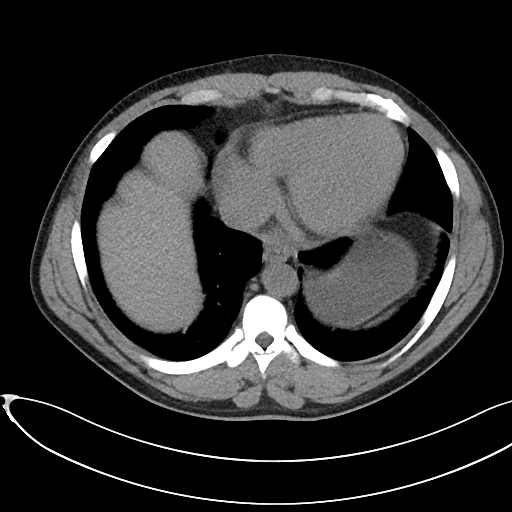

[Series 6: cor · coronal · 0.84mm/px · 3 of 108 slices shown]
[im 36/108  soft-tissue]
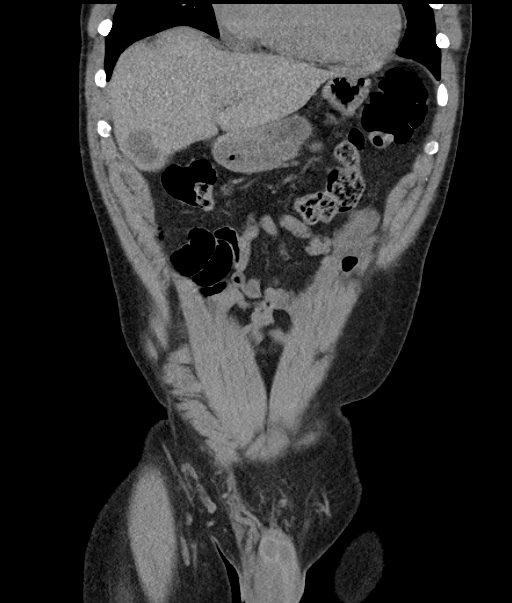
[im 48/108  soft-tissue]
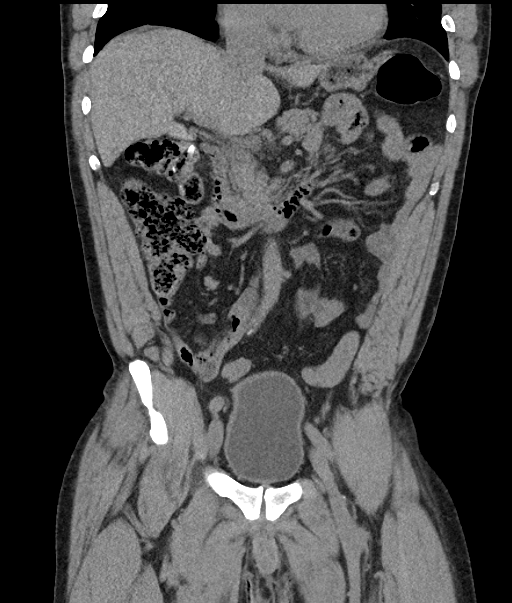
[im 60/108  soft-tissue]
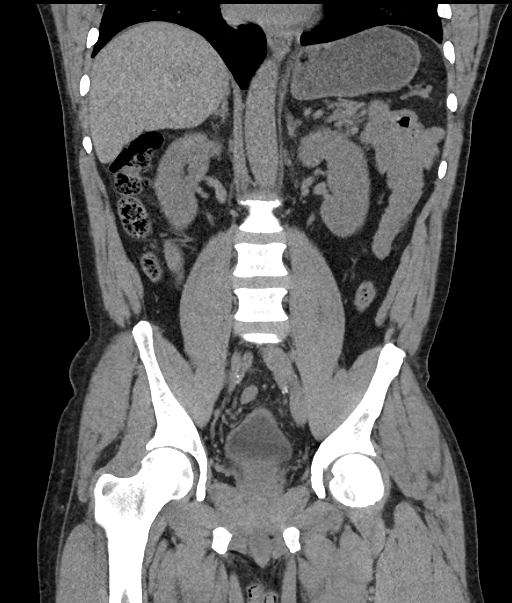

[16 of 46 positions shown; findings below may reference images not displayed]

FINDINGS: Lower chest: Bilateral lower lobe opacities have a linear
configuration on coronal and sagittal reformats and are most
consistent with atelectasis. Trace pericardial effusion.

Hepatobiliary: Layering hyperdensity in the gallbladder. No
pericholecystic inflammation. No focal hepatic abnormality on
noncontrast exam. No biliary dilatation.

Pancreas: No ductal dilatation or inflammation.

Spleen: Normal in size without focal abnormality.

Adrenals/Urinary Tract: Normal adrenal glands. No renal stones or
hydronephrosis. No significant perinephric edema. Both ureters are
decompressed without stone along the course. Urinary bladder is
partially distended. No bladder stone. No bladder wall thickening.

Stomach/Bowel: Bowel evaluation is limited in the absence of enteric
contrast. Fluid within the stomach. No obvious gastric wall
thickening. Normal positioning of the ligament of Treitz. No bowel
obstruction or inflammation. Appendix is not visualized, prior
appendectomy per history. Small volume of colonic stool. No colonic
wall thickening or inflammation.

Vascular/Lymphatic: Mild aorta bi-iliac atherosclerosis. No
aneurysm. No enlarged lymph nodes in the abdomen or pelvis.

Reproductive: Prostate is unremarkable.

Other: No free air or free fluid. Tiny fat containing umbilical
hernia.

Musculoskeletal: Lower lumbar facet degeneration. There are no acute
or suspicious osseous abnormalities.
IMPRESSION: 1. No renal stones or obstructive uropathy. No acute abnormality in
the abdomen/pelvis.
2. Layering hyperdensity in the gallbladder, likely combination of
sludge and stones. No pericholecystic inflammation.
3.  Aortic Atherosclerosis ([id number]).

## 2020-03-19 ENCOUNTER — Telehealth: Payer: Self-pay

## 2020-03-19 ENCOUNTER — Ambulatory Visit (HOSPITAL_COMMUNITY)
Admission: EM | Admit: 2020-03-19 | Discharge: 2020-03-19 | Payer: Self-pay | Attending: Family Medicine | Admitting: Family Medicine

## 2020-03-19 ENCOUNTER — Encounter (HOSPITAL_COMMUNITY): Payer: Self-pay | Admitting: *Deleted

## 2020-03-19 ENCOUNTER — Emergency Department (HOSPITAL_COMMUNITY): Payer: Self-pay

## 2020-03-19 ENCOUNTER — Ambulatory Visit: Payer: Self-pay | Admitting: Family Medicine

## 2020-03-19 ENCOUNTER — Other Ambulatory Visit: Payer: Self-pay

## 2020-03-19 ENCOUNTER — Inpatient Hospital Stay (HOSPITAL_COMMUNITY)
Admission: EM | Admit: 2020-03-19 | Discharge: 2020-03-21 | DRG: 309 | Disposition: A | Discharge status: Home / Self Care | Payer: Self-pay | Attending: Family Medicine | Admitting: Family Medicine

## 2020-03-19 ENCOUNTER — Encounter (HOSPITAL_COMMUNITY): Payer: Self-pay

## 2020-03-19 DIAGNOSIS — I5189 Other ill-defined heart diseases: Secondary | ICD-10-CM

## 2020-03-19 DIAGNOSIS — E860 Dehydration: Secondary | ICD-10-CM | POA: Diagnosis not present

## 2020-03-19 DIAGNOSIS — Z91120 Patient's intentional underdosing of medication regimen due to financial hardship: Secondary | ICD-10-CM

## 2020-03-19 DIAGNOSIS — Z79899 Other long term (current) drug therapy: Secondary | ICD-10-CM

## 2020-03-19 DIAGNOSIS — I10 Essential (primary) hypertension: Secondary | ICD-10-CM | POA: Diagnosis present

## 2020-03-19 DIAGNOSIS — Z20822 Contact with and (suspected) exposure to covid-19: Secondary | ICD-10-CM | POA: Diagnosis present

## 2020-03-19 DIAGNOSIS — E7800 Pure hypercholesterolemia, unspecified: Secondary | ICD-10-CM | POA: Diagnosis present

## 2020-03-19 DIAGNOSIS — I483 Typical atrial flutter: Principal | ICD-10-CM | POA: Diagnosis present

## 2020-03-19 DIAGNOSIS — N179 Acute kidney failure, unspecified: Secondary | ICD-10-CM | POA: Diagnosis not present

## 2020-03-19 DIAGNOSIS — R000 Tachycardia, unspecified: Secondary | ICD-10-CM

## 2020-03-19 DIAGNOSIS — I110 Hypertensive heart disease with heart failure: Secondary | ICD-10-CM | POA: Diagnosis present

## 2020-03-19 DIAGNOSIS — F17200 Nicotine dependence, unspecified, uncomplicated: Secondary | ICD-10-CM | POA: Diagnosis present

## 2020-03-19 DIAGNOSIS — Z7901 Long term (current) use of anticoagulants: Secondary | ICD-10-CM

## 2020-03-19 DIAGNOSIS — I4892 Unspecified atrial flutter: Secondary | ICD-10-CM

## 2020-03-19 DIAGNOSIS — R17 Unspecified jaundice: Secondary | ICD-10-CM | POA: Diagnosis not present

## 2020-03-19 DIAGNOSIS — R0602 Shortness of breath: Secondary | ICD-10-CM

## 2020-03-19 DIAGNOSIS — F17210 Nicotine dependence, cigarettes, uncomplicated: Secondary | ICD-10-CM | POA: Diagnosis present

## 2020-03-19 DIAGNOSIS — I5022 Chronic systolic (congestive) heart failure: Secondary | ICD-10-CM | POA: Diagnosis present

## 2020-03-19 DIAGNOSIS — G8929 Other chronic pain: Secondary | ICD-10-CM | POA: Diagnosis present

## 2020-03-19 DIAGNOSIS — E785 Hyperlipidemia, unspecified: Secondary | ICD-10-CM | POA: Diagnosis present

## 2020-03-19 DIAGNOSIS — Z8249 Family history of ischemic heart disease and other diseases of the circulatory system: Secondary | ICD-10-CM

## 2020-03-19 DIAGNOSIS — M5440 Lumbago with sciatica, unspecified side: Secondary | ICD-10-CM | POA: Diagnosis present

## 2020-03-19 DIAGNOSIS — Z9114 Patient's other noncompliance with medication regimen: Secondary | ICD-10-CM

## 2020-03-19 DIAGNOSIS — T50906A Underdosing of unspecified drugs, medicaments and biological substances, initial encounter: Secondary | ICD-10-CM | POA: Diagnosis present

## 2020-03-19 DIAGNOSIS — Z7982 Long term (current) use of aspirin: Secondary | ICD-10-CM

## 2020-03-19 LAB — CBC
HCT: 46.4 % (ref 39.0–52.0)
Hemoglobin: 15.3 g/dL (ref 13.0–17.0)
MCH: 32.3 pg (ref 26.0–34.0)
MCHC: 33 g/dL (ref 30.0–36.0)
MCV: 98.1 fL (ref 80.0–100.0)
Platelets: 155 10*3/uL (ref 150–400)
RBC: 4.73 MIL/uL (ref 4.22–5.81)
RDW: 12.8 % (ref 11.5–15.5)
WBC: 4.6 10*3/uL (ref 4.0–10.5)
nRBC: 0 % (ref 0.0–0.2)

## 2020-03-19 LAB — BASIC METABOLIC PANEL
Anion gap: 7 (ref 5–15)
BUN: 9 mg/dL (ref 6–20)
CO2: 24 mmol/L (ref 22–32)
Calcium: 10.1 mg/dL (ref 8.9–10.3)
Chloride: 107 mmol/L (ref 98–111)
Creatinine, Ser: 0.96 mg/dL (ref 0.61–1.24)
GFR, Estimated: >60 mL/min (ref >60)
Glucose, Bld: 99 mg/dL (ref 70–99)
Potassium: 4.5 mmol/L (ref 3.5–5.1)
Sodium: 138 mmol/L (ref 135–145)

## 2020-03-19 LAB — TROPONIN I (HIGH SENSITIVITY)
Troponin I (High Sensitivity): 24 ng/L — ABNORMAL HIGH (ref <18)
Troponin I (High Sensitivity): 32 ng/L — ABNORMAL HIGH (ref <18)

## 2020-03-19 LAB — BRAIN NATRIURETIC PEPTIDE: B Natriuretic Peptide: 172 pg/mL — ABNORMAL HIGH (ref 0.0–100.0)

## 2020-03-19 MED ORDER — NICOTINE 14 MG/24HR TD PT24
14.0000 mg | MEDICATED_PATCH | Freq: Once | TRANSDERMAL | Status: AC
  Administered 2020-03-19: 14 mg via TRANSDERMAL
  Filled 2020-03-19: qty 1

## 2020-03-19 MED ORDER — METOPROLOL SUCCINATE ER 25 MG PO TB24
50.0000 mg | ORAL_TABLET | Freq: Every day | ORAL | Status: DC
  Administered 2020-03-19: 50 mg via ORAL
  Filled 2020-03-19: qty 2

## 2020-03-19 MED ORDER — METOPROLOL SUCCINATE ER 25 MG PO TB24: 25.0000 mg | ORAL_TABLET | Freq: Every day | ORAL | 0 refills | Status: DC

## 2020-03-19 MED ORDER — DILTIAZEM HCL-DEXTROSE 125-5 MG/125ML-% IV SOLN (PREMIX)
5.0000 mg/h | INTRAVENOUS | Status: DC
  Administered 2020-03-19: 5 mg/h via INTRAVENOUS
  Administered 2020-03-19: 10 mg/h via INTRAVENOUS
  Administered 2020-03-19 – 2020-03-20 (×2): 12.5 mg/h via INTRAVENOUS
  Filled 2020-03-19 (×2): qty 125

## 2020-03-19 MED ORDER — DILTIAZEM HCL 60 MG PO TABS
120.0000 mg | ORAL_TABLET | Freq: Once | ORAL | Status: DC
  Filled 2020-03-19: qty 2

## 2020-03-19 MED ORDER — RIVAROXABAN 20 MG PO TABS
20.0000 mg | ORAL_TABLET | Freq: Once | ORAL | Status: AC
  Administered 2020-03-19: 20 mg via ORAL
  Filled 2020-03-19: qty 1

## 2020-03-19 MED ORDER — RIVAROXABAN 15 MG PO TABS: 15.0000 mg | ORAL_TABLET | Freq: Every day | ORAL | 0 refills | Status: DC

## 2020-03-19 NOTE — Consult Note (Signed)
Cardiology Consultation:   Patient ID: Kevin Mora MRN: [id number]; DOB: May 26, 1966  Admit date: 03/19/2020 Date of Consult: 03/19/2020  Primary Care Provider: Martyn Malay, MD [place] Cardiologist: No primary care provider on file.  [facility] Electrophysiologist:  None    Patient Profile:   Kevin Mora is a 54 y.o. male with a hx of paroxysmal atrial flutter who is being seen today for the evaluation of atrial flutter/RVR at the request of Dr. Nancy Fetter (Fam Med).  History of Present Illness:   Kevin Mora has h/o paroxysmal atrial flutter diagnosed in 2019, and was started on several medications that he never took consistently due to inability to afford them. He has never followed up with cardiology on an outpatient basis. It appears that a TTE done at the time showed EF 40-45%.  Pt ended up at the ED tonight after going to Urgent Care center due to elevated BP and HR.  He told UC today that his BP has been as high as 240/186mmHg. He told me he has noticed that his HR was higher than usual for the past 2-3 days.  He says he has been a little more fatigued and SOB than usual for the past several days and is not able to exert himself w/o having to stop and catch his breath. He says he is aware of his heart beating fast and has some mild discomfort as a result of that.  HR has been difficult to control in the ED. Low 100s at the time of my exam on cardizem gtt.    Past Medical History:  Diagnosis Date  . Atrial flutter ([place]) 12/2017  . Tobacco abuse     Past Surgical History:  Procedure Laterality Date  . APPENDECTOMY    . LIPOMA EXCISION     right shoulder  . WISDOM TOOTH EXTRACTION       Home Medications:  Prior to Admission medications   Medication Sig Start Date End Date Taking? Authorizing Provider  aspirin EC 81 MG tablet Take 81 mg by mouth daily.   Yes [provider]  methocarbamol (ROBAXIN) 500 MG tablet Take 1 tablet (500 mg total) by mouth  every 8 (eight) hours as needed for muscle spasms. Patient not taking: Reported on 03/19/2020 11/15/18   Mesner, Corene Cornea, MD  metoprolol succinate (TOPROL-XL) 25 MG 24 hr tablet Take 1 tablet (25 mg total) by mouth daily. 03/19/20   Roosevelt Locks, MD  oxyCODONE-acetaminophen (PERCOCET) 5-325 MG tablet Take 1 tablet by mouth every 6 (six) hours as needed for severe pain. Patient not taking: Reported on 03/19/2020 11/15/18   Mesner, Corene Cornea, MD  predniSONE (DELTASONE) 20 MG tablet 3 tabs po daily x 3 days, then 2 tabs x 3 days, then 1.5 tabs x 3 days, then 1 tab x 3 days, then 0.5 tabs x 3 days Patient not taking: Reported on 03/19/2020 11/15/18   Mesner, Corene Cornea, MD  Rivaroxaban (XARELTO) 15 MG TABS tablet Take 1 tablet (15 mg total) by mouth daily with supper. 03/19/20   Roosevelt Locks, MD  diltiazem (CARDIZEM CD) 120 MG 24 hr capsule Take 1 capsule (120 mg total) by mouth daily. Patient not taking: Reported on 11/15/2018 02/19/18 11/15/18  Martyn Malay, MD    Inpatient Medications: Scheduled Meds: . metoprolol succinate  50 mg Oral Daily  . nicotine  14 mg Transdermal Once   Continuous Infusions: . diltiazem (CARDIZEM) infusion 10 mg/hr (03/19/20 2300)   PRN Meds:   Allergies:   No  Known Allergies  Social History:   Social History   Socioeconomic History  . Marital status: Single    Spouse name: Not on file  . Number of children: Not on file  . Years of education: Not on file  . Highest education level: Not on file  Occupational History  . Not on file  Tobacco Use  . Smoking status: Current Every Day Smoker    Packs/day: 0.50    Years: 30.00    Pack years: 15.00    Types: Cigarettes  . Smokeless tobacco: Never Used  Vaping Use  . Vaping Use: Never used  Substance and Sexual Activity  . Alcohol use: Yes    Comment: Socially   . Drug use: Yes    Types: Marijuana  . Sexual activity: Yes    Partners: Male  Other Topics Concern  . Not on file  Social History Narrative    Previously worked in Psychologist, educational now just working intermittently.  He lives with his mother.  He reports his other 2 siblings have passed away.   Social Determinants of Health   Financial Resource Strain:   . Difficulty of Paying Living Expenses: Not on file  Food Insecurity:   . Worried About Charity fundraiser in the Last Year: Not on file  . Ran Out of Food in the Last Year: Not on file  Transportation Needs:   . Lack of Transportation (Medical): Not on file  . Lack of Transportation (Non-Medical): Not on file  Physical Activity:   . Days of Exercise per Week: Not on file  . Minutes of Exercise per Session: Not on file  Stress:   . Feeling of Stress : Not on file  Social Connections:   . Frequency of Communication with Friends and Family: Not on file  . Frequency of Social Gatherings with Friends and Family: Not on file  . Attends Religious Services: Not on file  . Active Member of Clubs or Organizations: Not on file  . Attends Archivist Meetings: Not on file  . Marital Status: Not on file  Intimate Partner Violence:   . Fear of Current or Ex-Partner: Not on file  . Emotionally Abused: Not on file  . Physically Abused: Not on file  . Sexually Abused: Not on file    Family History:    Family History  Problem Relation Age of Onset  . Diabetes Mother   . Hypertension Mother   . Coronary artery disease Father      ROS:  Please see the history of present illness.   All other ROS reviewed and negative.     Physical Exam/Data:   Vitals:   03/19/20 2115 03/19/20 2130 03/19/20 2230 03/19/20 2300  BP:   (!) 168/104 (!) 163/137  Pulse:   (!) 127 (!) 128  Resp: (!) 26 (!) 23 (!) 27 19  Temp:      TempSrc:      SpO2: 100% 100% 96% 99%  Weight:      Height:        Intake/Output Summary (Last 24 hours) at 03/19/2020 2311 Last data filed at 03/19/2020 1957 Gross per 24 hour  Intake --  Output 475 ml  Net -475 ml   Last 3 Weights 03/19/2020 11/14/2018  02/06/2018  Weight (lbs) 186 lb 200 lb 205 lb  Weight (kg) 84.369 kg 90.719 kg 92.987 kg     Body mass index is 26.69 kg/m.  General:  Well nourished, well developed, in no  acute distress HEENT: normal Lymph: no adenopathy Neck: no JVD Endocrine:  No thryomegaly Vascular: No carotid bruits; DP pulses 2+ bilaterally   Cardiac:  normal S1, S2; irregular; no murmur  Lungs:  clear to auscultation bilaterally, no wheezing, rhonchi or rales  Abd: soft, nontender, no hepatomegaly  Ext: no edema Musculoskeletal:  No deformities Skin: warm and dry  Neuro:   no focal abnormalities noted Psych:  Normal affect   EKG:  The EKG was personally reviewed and demonstrates:  Typical atrial flutter w/ 2:1 AV conduction, HR 131 Telemetry:  Telemetry was personally reviewed and demonstrates:  Atrial flutter w/ HR in low 100s  Relevant CV Studies: TTE 01-30-18 Left ventricle: The cavity size was normal. Wall thickness was  normal. Systolic function was mildly to moderately reduced. The  estimated ejection fraction was in the range of 40% to 45%. Mild  diffuse hypokinesis with no identifiable regional variations.  - Mitral valve: There was mild to moderate regurgitation directed  centrally.  - Left atrium: The atrium was mildly dilated.  - Right ventricle: The cavity size was moderately dilated. Systolic  function was mildly reduced.  - Right atrium: The atrium was mildly dilated.  - Pericardium, extracardiac: A trivial pericardial effusion was  identified.   Laboratory Data:  High Sensitivity Troponin:   Recent Labs  Lab 03/19/20 1739 03/19/20 1939  TROPONINIHS 24* 32*     Chemistry Recent Labs  Lab 03/19/20 1609  NA 138  K 4.5  CL 107  CO2 24  GLUCOSE 99  BUN 9  CREATININE 0.96  CALCIUM 10.1  GFRNONAA >60  ANIONGAP 7    No results for input(s): PROT, ALBUMIN, AST, ALT, ALKPHOS, BILITOT in the last 168 hours. Hematology Recent Labs  Lab 03/19/20 1609  WBC 4.6    RBC 4.73  HGB 15.3  HCT 46.4  MCV 98.1  MCH 32.3  MCHC 33.0  RDW 12.8  PLT 155   BNP Recent Labs  Lab 03/19/20 1739  BNP 172.0*    DDimer No results for input(s): DDIMER in the last 168 hours.   Radiology/Studies:  DG Chest 2 View  Result Date: 03/19/2020 CLINICAL DATA:  Hypertension EXAM: CHEST - 2 VIEW COMPARISON:  01/04/2018 FINDINGS: The heart size is upper limits of normal, unchanged. Both lungs are clear. The visualized skeletal structures are unremarkable. IMPRESSION: No active cardiopulmonary disease. Electronically Signed   By: Davina Poke D.O.   On: 03/19/2020 16:32     Assessment and Plan:   1. Atrial flutter: typical atrial flutter. Would just cont dilt gtt for now (may be difficult to rate-control as this is flutter as opposed to fib), but ideal treatment will likely be ablation. Will get EP involved in the AM. Will need TEE prior as he has not been on [facility] as outpatient. Given xarelto 20mg  x 1 dose in the ED tonight. CHA2DS2-VASc Score = 2 [CHF History: 1, HTN History: 1, Diabetes History: 0, Stroke History: 0, Vascular Disease History: 0, Age Score: 0, Gender Score: 0].  Therefore, the patient's annual risk of stroke is 2.2 %.    2. H/o LV dysfunction: EF 40-45% in 2019. Will get repeat TTE. If EF reduced, likely to be a tachycardia-mediated cardiomyopathy but he will still ultimately need LHC to r/o obstructive CAD. Will also need medical optimization. TTE pending for AM. Overall he is fairly well-compensated clinically at this time. 3. HTN: on cardizem gtt which can be continued for the short-term. Defer BP mgmt to  primary team        New York Heart Association (NYHA) Functional Class NYHA Class I   CHA2DS2-VASc Score = 2  This indicates a 2.2% annual risk of stroke. The patient's score is based upon:          For questions or updates, please contact [place] Please consult [email or web address] for contact info under    Signed, Rudean Curt, MD, [facility] 03/19/2020 11:11 PM

## 2020-03-19 NOTE — Care Management (Signed)
ED CM spoke with the patient at the bedside. Patient states he has not taken his medication because he cannot afford the cost. He has been out of work due to [employer]. 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 Patient uses the bus for transportation. His nephew will pick him up  and has agreed to take him to the pharmacy at discharge. ED CM spoke with EDP about the cost of the patient's medication. EDP will prescribe a medication available at [facility] for $4. Patient has $4 to pay for his medication at discharge. Patient  States he has an appointment with Dr. Owens Shark on 04/08/2020 at [facility] Patient would also like the contact information for [place].

## 2020-03-19 NOTE — H&P (Addendum)
[facility] Admission History and Physical Service Pager: [phone]  Patient name: Kevin Mora Medical record number: [id number] Date of birth: 1966/01/10 Age: 54 y.o. Gender: male  Primary Care Provider: Martyn Malay, MD Consultants: Cardiology Code Status: FULL   Chief Complaint:  Severe HTN, atrial flutter   Assessment and Plan: Kevin Mora is a 54 y.o. male presenting with palpitations and severe HTN found to be in A Flutter. PMH is significant for paroxysmal A Flutter, HTN, tobacco use, chronic back pain.  A Flutter Kevin Mora is a 54 yr old male with history of paroxysmal atrial flutter diagnosed in 2019. He was prescribed Diltiazem and Xarelto previously but non-complaint due to cost of medications. He presents with 2 day history of palpitations and severe HTN. Reporting BP as high as 220/130 at home yesterday. Seen in urgent care today for HTN and HR >130, found to be in atrial flutter and was then sent to the ED. EKG consistent with atrial flutter with 2:1 AV conduction with HR 131, BP 168/104, sats 98% on room air. Labs: CBC wnl, CMP wnl, trop 24>32. CXR: no acute infiltrates, no pulmonary edema. In the ED pt was started on Diltiazem gtt, metoprolol tartrate and Xarelto 20mg . Currently asymptomatic when sitting up but does report symptoms such as palpitations and chest pressure when laying flat. There was some improvement in HR with current diltiazem rate 12.5mg /hr at time of evaluation. No evidence of precipitating factors of Atrial flutter such as infection:afebrile WBC wnl and heart failure: no evidence of volume overload on exam. CXR neg and BNP 172. Cardiology consulted in the ED who recommended continuing diltiazem gtt but will likely need ablation. EP will see patient in the am. CHADS-VASc 2, annual stroke risk 2.2% -Admit to FPTS, Dr McDiarmid, progressive floor -Continuous telemetry  -Cardiology following, appreciate recs -Continue  Diltiazem gtt until HR stabilizes, then switch to PO Diltiazem   -Continue Xarelto per pharmacy -Up with assistance  - F/u TEE -Repeat EKG - labs: am CBC, BMP, TSH, HbA1c, lipid panel, PT/INR, aPTT, Mg, UDS - vitals per unit routine - TOC c/s for medication assistance  Severe HTN 146/119 on admission. However, home readings with SBP > 200. No signs of end organ damage. Did have a mild frontal headache this morning but now asymptomatic. Currently on diltiazem drip and started on metoprolol succinate 50 mg in the ED. -Can consider starting other antihypertensives such as HCTZ or amlodipine tomorrow if remains hypertensive -Encourage follow up as a outpatient   Elevated troponin Mild elevation in troponin 24 > 35. EKG without ST changes. Suspect demand ischemia in the setting of A Flutter. - continue trending troponin  HLD    Lipid panel obtained on admission: LDL 162 on admission. 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 Not on any diuretics at home. BNP 172 on admission. No evidence of volume overload on exam. 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 - f/u TTE - strict I/O - daily weights  Tobacco misuse disorder  Smokes 1/2 pack a day since age 37. -Encourage smoking cessation -Nicotine patch 14mg  daily   Substance drugs [place] for back pain but stopped 45 days ago. He has been getting it from his niece has who was has a brain tumor and uses it in setting of her medical condition. -Encourage cessation of recreational drugs -Follow up UDS  Chronic low back pain,  sciatica -Tylenol 650mg  Q6PRN -Monitor pain  FEN/GI: Heart healthy  Prophylaxis: rivaroxaban  Disposition: progressive  History of Present Illness:  Kevin Mora is a 54 y.o. male presenting with palpitations and hypertension.  Patient reports palpitations starting Tuesday, 2 days ago  and reports feeling a sensation of someone squeezing the left side of his chest when laying down.  He has also noticed increased blood pressure readings, as high as 637 systolic.  He had called the [facility] nurse line today regarding his elevated BP readings, and was instructed to come to the urgent care.  At urgent care, patient was noted to be in atrial flutter and was sent to the ED.  Patient has a history of paroxysmal a flutter diagnosed in 2019.  He was discharged on rivaroxaban, diltiazem, and blood pressure medications, but these were never started due to cost.  He states that his PCP, Dr. Owens Shark, put him on baby ASA since he is unable to afford anticoagulation.  He has not been hospitalized for atrial flutter since 2019.  Review Of Systems: Per HPI with the following additions:   Review of Systems  Constitutional: Negative for chills and fever.  Eyes: Negative for blurred vision and double vision.  Respiratory: Negative for cough, shortness of breath and wheezing.   Cardiovascular: Positive for palpitations. Negative for chest pain.  Gastrointestinal: Negative for abdominal pain, blood in stool, constipation, diarrhea, melena, nausea and vomiting.  Genitourinary: Negative for dysuria, frequency, hematuria and urgency.  Musculoskeletal: Negative for falls.  Neurological: Positive for headaches. Negative for dizziness, sensory change, speech change, focal weakness, seizures, loss of consciousness and weakness.    Patient Active Problem List   Diagnosis Date Noted  . Chronic low back pain 01/23/2018  . Atrial flutter with rapid ventricular response ([place]) 01/09/2018  . Bilateral headaches 01/09/2018  . TOBACCO USER 03/07/2009    Past Medical History: Past Medical History:  Diagnosis Date  . Atrial flutter ([place]) 12/2017  . Tobacco abuse     Past Surgical History: Past Surgical History:  Procedure Laterality Date  . APPENDECTOMY    . LIPOMA EXCISION     right shoulder  . WISDOM  TOOTH EXTRACTION      Social History: Social History   Tobacco Use  . Smoking status: Current Every Day Smoker    Packs/day: 0.50    Years: 30.00    Pack years: 15.00    Types: Cigarettes  . Smokeless tobacco: Never Used  Vaping Use  . Vaping Use: Never used  Substance Use Topics  . Alcohol use: Yes    Comment: Socially   . Drug use: Yes    Types: Marijuana   Additional social history: Lives with mom, great niece and niece. Smokes 1/2 pack a day since age 40. Drinks on average 2 40-oz beers on weekends.  Former Editor, commissioning. Now unemployed due to Covid. Patient states he has been eating healthier and has lost weight to help with his blood pressure. Please also refer to relevant sections of EMR.  Family History: Family History  Problem Relation Age of Onset  . Diabetes Mother   . Hypertension Mother   . Coronary artery disease Father     Allergies and Medications: No Known Allergies No current facility-administered medications on file prior to encounter.   Current Outpatient Medications on File Prior to Encounter  Medication Sig Dispense Refill  . aspirin EC 81 MG tablet Take 81 mg by mouth daily.    . methocarbamol (ROBAXIN)  500 MG tablet Take 1 tablet (500 mg total) by mouth every 8 (eight) hours as needed for muscle spasms. (Patient not taking: Reported on 03/19/2020) 20 tablet 0  . oxyCODONE-acetaminophen (PERCOCET) 5-325 MG tablet Take 1 tablet by mouth every 6 (six) hours as needed for severe pain. (Patient not taking: Reported on 03/19/2020) 10 tablet 0  . predniSONE (DELTASONE) 20 MG tablet 3 tabs po daily x 3 days, then 2 tabs x 3 days, then 1.5 tabs x 3 days, then 1 tab x 3 days, then 0.5 tabs x 3 days (Patient not taking: Reported on 03/19/2020) 27 tablet 0  . [DISCONTINUED] diltiazem (CARDIZEM CD) 120 MG 24 hr capsule Take 1 capsule (120 mg total) by mouth daily. (Patient not taking: Reported on 11/15/2018) 30 capsule 5    Objective: BP (!)  168/104   Pulse (!) 127   Temp 98.8 F (37.1 C) (Oral)   Resp (!) 27   Ht 5\' 10"  (1.778 m)   Wt 84.4 kg   SpO2 96%   BMI 26.69 kg/m  Exam: General: Well-appearing male, resting comfortably in bed, NAD Eyes: EOMI, PERRL ENTM: MMM, posterior oropharynx clear, no palpable masses or thyromegaly  Neck: supple, no LAD Cardiovascular: Tachycardic, regular rhythm, no murmurs, 2+ radial pulses, no edema Respiratory: CTAB Gastrointestinal: soft, non-tender, +BS MSK: moving all extremities Derm: warm and dry, no rash Neuro: alert, conversant, 5/5 strength upper and lower extremities Psych: affect appropriate  Labs and Imaging: CBC BMET  Recent Labs  Lab 03/19/20 1609  WBC 4.6  HGB 15.3  HCT 46.4  PLT 155   Recent Labs  Lab 03/19/20 1609  NA 138  K 4.5  CL 107  CO2 24  BUN 9  CREATININE 0.96  GLUCOSE 99  CALCIUM 10.1     EKG - atrial flutter with 2:1 AV conduction  Zola Button, MD 03/19/2020, 10:39 PM PGY-1, [place] Intern pager: [phone], text pages welcome  FPTS Upper-Level Resident Addendum   I have independently interviewed and examined the patient. I have discussed the above with the original author and agree with their documentation. My edits for correction/addition/clarification are in blue. Please see also any attending notes.   Lattie Haw MD PGY-2, [place] [facility] 03/20/2020 12:35 AM  FPTS Service pager: [phone] (text pages welcome through [facility])

## 2020-03-19 NOTE — ED Provider Notes (Signed)
[facility]    CSN: [id number] Arrival date & time: 03/19/20  1313      History   Chief Complaint Chief Complaint  Patient presents with  . Hypertension    since tuesday    HPI Kevin Mora is a 54 y.o. male.   Patient is a 54 year old male with past medical history of a flutter, hypertension.  He presents today with concerns of elevated heart rate and blood pressure readings.  Reporting this is been worse over the last couple days with heart rate in the 120s and blood pressure readings in the 315Q systolic.  Report has been 240/125 consistently.  He has been having shortness of breath more with exertion and unable to sleep at night or lay down.  No significant leg swelling but unable to walk only short distances.  Was hospitalized in 2019 for a flutter with rate in the 140s.  Was discharged with medications to include Xarelto, diltiazem and blood pressure medications which he never started due to cost.  He has been try to get financial aid for multiple resources and unable to get any help.  He is only taking 1 Bayer aspirin daily.  Having some right-sided chest discomfort and funny sensations in his chest.      Past Medical History:  Diagnosis Date  . Atrial flutter ([place]) 12/2017  . Tobacco abuse     Patient Active Problem List   Diagnosis Date Noted  . Chronic low back pain 01/23/2018  . Atrial flutter with rapid ventricular response ([place]) 01/09/2018  . Bilateral headaches 01/09/2018  . TOBACCO USER 03/07/2009    Past Surgical History:  Procedure Laterality Date  . APPENDECTOMY    . LIPOMA EXCISION     right shoulder  . WISDOM TOOTH EXTRACTION         Home Medications    Prior to Admission medications   Medication Sig Start Date End Date Taking? Authorizing Provider  acetaminophen (TYLENOL) 500 MG tablet Take 1,000 mg by mouth every 6 (six) hours as needed for mild pain.    [provider]  aspirin EC 81 MG tablet Take 81 mg by mouth  daily.    [provider]  methocarbamol (ROBAXIN) 500 MG tablet Take 1 tablet (500 mg total) by mouth every 8 (eight) hours as needed for muscle spasms. 11/15/18   Mesner, Corene Cornea, MD  oxyCODONE-acetaminophen (PERCOCET) 5-325 MG tablet Take 1 tablet by mouth every 6 (six) hours as needed for severe pain. 11/15/18   Mesner, Corene Cornea, MD  predniSONE (DELTASONE) 20 MG tablet 3 tabs po daily x 3 days, then 2 tabs x 3 days, then 1.5 tabs x 3 days, then 1 tab x 3 days, then 0.5 tabs x 3 days 11/15/18   Mesner, Corene Cornea, MD  diltiazem (CARDIZEM CD) 120 MG 24 hr capsule Take 1 capsule (120 mg total) by mouth daily. Patient not taking: Reported on 11/15/2018 02/19/18 11/15/18  Martyn Malay, MD  metoprolol succinate (TOPROL-XL) 25 MG 24 hr tablet Take 1 tablet (25 mg total) by mouth daily. Patient not taking: Reported on 02/06/2018 01/31/18 11/15/18  Martyn Malay, MD    Family History Family History  Problem Relation Age of Onset  . Diabetes Mother   . Hypertension Mother   . Coronary artery disease Father     Social History Social History   Tobacco Use  . Smoking status: Current Every Day Smoker    Packs/day: 0.50    Years: 30.00  Pack years: 15.00    Types: Cigarettes  . Smokeless tobacco: Never Used  Vaping Use  . Vaping Use: Never used  Substance Use Topics  . Alcohol use: Yes    Comment: Socially   . Drug use: Yes    Types: Marijuana     Allergies   Patient has no known allergies.   Review of Systems Review of Systems   Physical Exam Triage Vital Signs ED Triage Vitals  Enc Vitals Group     BP 03/19/20 1504 (!) 146/119     Pulse Rate 03/19/20 1504 (!) 129     Resp 03/19/20 1504 18     Temp 03/19/20 1504 98.4 F (36.9 C)     Temp Source 03/19/20 1504 Oral     SpO2 03/19/20 1504 100 %     Weight --      Height --      Head Circumference --      Peak Flow --      Pain Score 03/19/20 1507 3     Pain Loc --      Pain Edu? --      Excl. in [place]? --    No data  found.  Updated Vital Signs BP (!) 146/119 (BP Location: Right Arm)   Pulse (!) 129   Temp 98.4 F (36.9 C) (Oral)   Resp 18   SpO2 100%   Visual Acuity Right Eye Distance:   Left Eye Distance:   Bilateral Distance:    Right Eye Near:   Left Eye Near:    Bilateral Near:     Physical Exam Vitals and nursing note reviewed.  Constitutional:      Appearance: Normal appearance.     Comments: Tearful   HENT:     Head: Normocephalic and atraumatic.     Nose: Nose normal.  Eyes:     Conjunctiva/sclera: Conjunctivae normal.  Cardiovascular:     Rate and Rhythm: Tachycardia present.  Pulmonary:     Effort: Pulmonary effort is normal.     Breath sounds: Normal breath sounds.  Musculoskeletal:        General: Normal range of motion.     Cervical back: Normal range of motion.  Skin:    General: Skin is warm and dry.  Neurological:     Mental Status: He is alert.  Psychiatric:        Mood and Affect: Mood normal.      UC Treatments / Results  Labs (all labs ordered are listed, but only abnormal results are displayed) Labs Reviewed - No data to display  EKG   Radiology No results found.  Procedures Procedures (including critical care time)  Medications Ordered in UC Medications - No data to display  Initial Impression / Assessment and Plan / UC Course  I have reviewed the triage vital signs and the nursing notes.  Pertinent labs & imaging results that were available during my care of the patient were reviewed by me and considered in my medical decision making (see chart for details).     A flutter Sending to the ER for further evaluation, management and treatment.  Concern for heart failure also at this time. Patient will most likely need to be hospitalized  Offered EMS  Patient otherwise stable and will take himself to the ER to check in Final Clinical Impressions(s) / UC Diagnoses   Final diagnoses:  Atrial flutter, unspecified type (HCC)  SOB  (shortness of breath)  Discharge Instructions     To ER     ED Prescriptions    None     PDMP not reviewed this encounter.   Orvan July, NP 03/19/20 1559

## 2020-03-19 NOTE — ED Notes (Signed)
Patient is being discharged from the Urgent Care and sent to the Emergency Department via personal veicle . Per provider Loura Halt, patient is in need of higher level of care due to EKG changes. Patient is aware and verbalizes understanding of plan of care.  Vitals:   03/19/20 1504  BP: (!) 146/119  Pulse: (!) 129  Resp: 18  Temp: 98.4 F (36.9 C)  SpO2: 100%

## 2020-03-19 NOTE — Telephone Encounter (Signed)
Patient calls nurse line reporting high blood pressures and heart rate for the last week. Patient reports his BP has been as high as 220/130 and today 160/110. Patient reports HR staying in 120s. Patient does endorse "crushing chest pain at night," however no headaches or blurry vision. Patient advised to seek evaluation ASAP. Patient going to urgent care now. Patient scheduled for 04/10/2020 with PCP.

## 2020-03-19 NOTE — ED Provider Notes (Signed)
[facility] EMERGENCY DEPARTMENT Provider Note   CSN: [id number] Arrival date & time: 03/19/20  1550     History Chief Complaint  Patient presents with  . Tachycardia    a flutter 131  . Hypertension    Raymon Schlarb is a 54 y.o. male.  The history is provided by the patient and medical records.  Shortness of Breath Severity:  Moderate Timing:  Intermittent Progression:  Waxing and waning Chronicity:  Chronic Context comment:  Laying flat at night Relieved by:  Sitting up Ineffective treatments:  None tried Associated symptoms: chest pain   Associated symptoms: no abdominal pain, no cough, no fever, no headaches, no neck pain, no rash, no sore throat and no vomiting   Risk factors comment:  A flutter, HTN -untreated         Past Medical History:  Diagnosis Date  . Atrial flutter ([place]) 12/2017  . Tobacco abuse     Patient Active Problem List   Diagnosis Date Noted  . Chronic low back pain 01/23/2018  . Atrial flutter with rapid ventricular response ([place]) 01/09/2018  . Bilateral headaches 01/09/2018  . TOBACCO USER 03/07/2009    Past Surgical History:  Procedure Laterality Date  . APPENDECTOMY    . LIPOMA EXCISION     right shoulder  . WISDOM TOOTH EXTRACTION         Family History  Problem Relation Age of Onset  . Diabetes Mother   . Hypertension Mother   . Coronary artery disease Father     Social History   Tobacco Use  . Smoking status: Current Every Day Smoker    Packs/day: 0.50    Years: 30.00    Pack years: 15.00    Types: Cigarettes  . Smokeless tobacco: Never Used  Vaping Use  . Vaping Use: Never used  Substance Use Topics  . Alcohol use: Yes    Comment: Socially   . Drug use: Yes    Types: Marijuana    Home Medications Prior to Admission medications   Medication Sig Start Date End Date Taking? Authorizing Provider  aspirin EC 81 MG tablet Take 81 mg by mouth daily.   Yes [provider]    methocarbamol (ROBAXIN) 500 MG tablet Take 1 tablet (500 mg total) by mouth every 8 (eight) hours as needed for muscle spasms. Patient not taking: Reported on 03/19/2020 11/15/18   Mesner, Corene Cornea, MD  metoprolol succinate (TOPROL-XL) 25 MG 24 hr tablet Take 1 tablet (25 mg total) by mouth daily. 03/19/20   Roosevelt Locks, MD  oxyCODONE-acetaminophen (PERCOCET) 5-325 MG tablet Take 1 tablet by mouth every 6 (six) hours as needed for severe pain. Patient not taking: Reported on 03/19/2020 11/15/18   Mesner, Corene Cornea, MD  predniSONE (DELTASONE) 20 MG tablet 3 tabs po daily x 3 days, then 2 tabs x 3 days, then 1.5 tabs x 3 days, then 1 tab x 3 days, then 0.5 tabs x 3 days Patient not taking: Reported on 03/19/2020 11/15/18   Mesner, Corene Cornea, MD  Rivaroxaban (XARELTO) 15 MG TABS tablet Take 1 tablet (15 mg total) by mouth daily with supper. 03/19/20   Roosevelt Locks, MD  diltiazem (CARDIZEM CD) 120 MG 24 hr capsule Take 1 capsule (120 mg total) by mouth daily. Patient not taking: Reported on 11/15/2018 02/19/18 11/15/18  Martyn Malay, MD    Allergies    Patient has no known allergies.  Review of Systems   Review of Systems  Constitutional: Negative  for activity change, appetite change, chills and fever.  HENT: Positive for rhinorrhea. Negative for congestion and sore throat.   Eyes: Negative for visual disturbance.  Respiratory: Positive for shortness of breath. Negative for cough.   Cardiovascular: Positive for chest pain. Negative for leg swelling.  Gastrointestinal: Negative for abdominal pain, diarrhea, nausea and vomiting.  Genitourinary: Negative for difficulty urinating.  Musculoskeletal: Negative for gait problem and neck pain.  Skin: Negative for color change and rash.  Neurological: Negative for weakness and headaches.  All other systems reviewed and are negative.   Physical Exam Updated Vital Signs BP (!) 168/104   Pulse (!) 127   Temp 98.8 F (37.1 C) (Oral)   Resp (!) 27   Ht 5'  10" (1.778 m)   Wt 84.4 kg   SpO2 96%   BMI 26.69 kg/m   Physical Exam Vitals and nursing note reviewed.  Constitutional:      General: He is not in acute distress.    Appearance: He is well-developed.  HENT:     Head: Normocephalic and atraumatic.     Nose: Nose normal.     Mouth/Throat:     Mouth: Mucous membranes are moist.     Pharynx: Oropharynx is clear.  Eyes:     Conjunctiva/sclera: Conjunctivae normal.  Cardiovascular:     Rate and Rhythm: Regular rhythm. Tachycardia present.     Heart sounds: No murmur heard.   Pulmonary:     Effort: Pulmonary effort is normal. No respiratory distress.     Breath sounds: Normal breath sounds. No wheezing, rhonchi or rales.  Abdominal:     General: Abdomen is flat.     Palpations: Abdomen is soft.     Tenderness: There is no abdominal tenderness.  Musculoskeletal:        General: No swelling or tenderness.     Cervical back: Neck supple.     Right lower leg: No edema.     Left lower leg: No edema.  Skin:    General: Skin is warm and dry.  Neurological:     Mental Status: He is alert.     Cranial Nerves: No cranial nerve deficit.  Psychiatric:        Mood and Affect: Mood normal.        Behavior: Behavior normal.     ED Results / Procedures / Treatments   Labs (all labs ordered are listed, but only abnormal results are displayed) Labs Reviewed  BRAIN NATRIURETIC PEPTIDE - Abnormal; Notable for the following components:      Result Value   B Natriuretic Peptide 172.0 (*)    All other components within normal limits  TROPONIN I (HIGH SENSITIVITY) - Abnormal; Notable for the following components:   Troponin I (High Sensitivity) 24 (*)    All other components within normal limits  TROPONIN I (HIGH SENSITIVITY) - Abnormal; Notable for the following components:   Troponin I (High Sensitivity) 32 (*)    All other components within normal limits  RESPIRATORY PANEL BY RT PCR (FLU A&B, COVID)  BASIC METABOLIC PANEL  CBC     EKG EKG Interpretation  Date/Time:  Thursday March 19 2020 15:57:06 EDT Ventricular Rate:  131 PR Interval:    QRS Duration: 174 QT Interval:  328 QTC Calculation: 484 R Axis:   33 Text Interpretation: Atrial flutter with 2:1 A-V conduction Right bundle branch block Cannot rule out Inferior infarct , age undetermined Abnormal ECG Confirmed by Carmin Muskrat [phone]) on 03/19/2020  6:17:06 PM   Radiology DG Chest 2 View  Result Date: 03/19/2020 CLINICAL DATA:  Hypertension EXAM: CHEST - 2 VIEW COMPARISON:  01/04/2018 FINDINGS: The heart size is upper limits of normal, unchanged. Both lungs are clear. The visualized skeletal structures are unremarkable. IMPRESSION: No active cardiopulmonary disease. Electronically Signed   By: Davina Poke D.O.   On: 03/19/2020 16:32    Procedures Procedures (including critical care time)  Medications Ordered in ED Medications  metoprolol succinate (TOPROL-XL) 24 hr tablet 50 mg (50 mg Oral Given 03/19/20 1944)  diltiazem (CARDIZEM) 125 mg in dextrose 5% 125 mL (1 mg/mL) infusion (5 mg/hr Intravenous New Bag/Given 03/19/20 2225)  nicotine (NICODERM CQ - dosed in mg/24 hours) patch 14 mg (14 mg Transdermal Patch Applied 03/19/20 2256)  rivaroxaban (XARELTO) tablet 20 mg (20 mg Oral Given 03/19/20 1944)    ED Course  I have reviewed the triage vital signs and the nursing notes.  Pertinent labs & imaging results that were available during my care of the patient were reviewed by me and considered in my medical decision making (see chart for details).    MDM Rules/Calculators/A&P                           Medical Decision Making: Newt Levingston is a 54 y.o. male who presented to the ED today from urgent care for a flutter. Pt reports he went to urgent care today because of elevated BP at home Pt reporting BP 220/130 at home yesterday. Pt has been having ongoing SOB and chest pressure at night when laying flat, pt reports this has been  going on for some time. Pt reports he was admitted in 2019 with aflutter and dc with rx for diltiazem and xarelto however he could not afford them and never started them.     Past medical history significant for atrial fibrillation, hypertension, chronic L lower back pain with sciatica, and tobacco abuse Reviewed and confirmed nursing documentation for past medical history, family history, social history.  On my initial exam, the pt was in NAD, normal WOB, lungs CTAB, no LE edema.   All radiology and laboratory studies reviewed independently and with my attending physician, agree with reading provided by radiologist unless otherwise noted.  Will give xarelto med starter pack to hold him over until his apt. Given metoprolol, as pt cannot afford diltiazem.  Upon reassessing patient, patient was in NAD, persistently tachycardic, HR 120s.  EKG with atrial flutter, 106 bpm.  Trop 24 to 32, delta 8. Pt not reporting CP, do not suspect ACS at this time. However, pt HR has not responded to oral medicines.  Will place on cardizem drip and admit. Will contd to trend trops.  Cardiology consulted.   Plan:  Admit to family medicine  Patient scheduled for follow up with PCP on 04/10/2020. Advised to keep this apt.    The above care was discussed with and agreed upon by my attending physician. Emergency Department Medication Summary:  Medications  metoprolol succinate (TOPROL-XL) 24 hr tablet 50 mg (50 mg Oral Given 03/19/20 1944)  diltiazem (CARDIZEM) 125 mg in dextrose 5% 125 mL (1 mg/mL) infusion (5 mg/hr Intravenous New Bag/Given 03/19/20 2225)  nicotine (NICODERM CQ - dosed in mg/24 hours) patch 14 mg (14 mg Transdermal Patch Applied 03/19/20 2256)  rivaroxaban (XARELTO) tablet 20 mg (20 mg Oral Given 03/19/20 1944)        Final Clinical Impression(s) / ED Diagnoses  Final diagnoses:  Tachycardia  Atrial flutter by electrocardiogram [facility])    Rx / DC Orders ED Discharge Orders          Ordered    metoprolol succinate (TOPROL-XL) 25 MG 24 hr tablet  Daily        03/19/20 1916    Rivaroxaban (XARELTO) 15 MG TABS tablet  Daily with supper        03/19/20 1916           Roosevelt Locks, MD 03/19/20 2258    Carmin Muskrat, MD 03/19/20 2321

## 2020-03-19 NOTE — ED Notes (Signed)
Lab to add on BNP and troponin to previous blood sent down.

## 2020-03-19 NOTE — ED Triage Notes (Signed)
Pt has been out of his medication due to financial constraints and is here due to heart racing.  Aflutter 131.  No CP or sob with this.

## 2020-03-19 NOTE — Discharge Instructions (Addendum)
To ER

## 2020-03-19 NOTE — ED Triage Notes (Signed)
Pt states he has been having a BP of approx 240/125 consistently. Pt is aox4 and ambulatory.

## 2020-03-19 NOTE — Telephone Encounter (Signed)
Agree with immediate evaluation.  Dorris Singh, MD  Family Medicine Teaching Service

## 2020-03-20 ENCOUNTER — Inpatient Hospital Stay (HOSPITAL_COMMUNITY): Payer: Self-pay

## 2020-03-20 DIAGNOSIS — I4892 Unspecified atrial flutter: Secondary | ICD-10-CM

## 2020-03-20 DIAGNOSIS — E7800 Pure hypercholesterolemia, unspecified: Secondary | ICD-10-CM | POA: Diagnosis present

## 2020-03-20 DIAGNOSIS — I340 Nonrheumatic mitral (valve) insufficiency: Secondary | ICD-10-CM

## 2020-03-20 LAB — ECHOCARDIOGRAM COMPLETE
Area-P 1/2: 4.39 cm2
Height: 70 in
MV M vel: 3.93 m/s
MV Peak grad: 61.8 mmHg
Radius: 0.7 cm
S' Lateral: 4.2 cm
Weight: 2976 oz

## 2020-03-20 LAB — LIPID PANEL
Cholesterol: 230 mg/dL — ABNORMAL HIGH (ref 0–200)
HDL: 53 mg/dL (ref >40)
LDL Cholesterol: 162 mg/dL — ABNORMAL HIGH (ref 0–99)
Total CHOL/HDL Ratio: 4.3 RATIO
Triglycerides: 77 mg/dL (ref <150)
VLDL: 15 mg/dL (ref 0–40)

## 2020-03-20 LAB — CBC
HCT: 46.5 % (ref 39.0–52.0)
Hemoglobin: 15 g/dL (ref 13.0–17.0)
MCH: 32.7 pg (ref 26.0–34.0)
MCHC: 32.3 g/dL (ref 30.0–36.0)
MCV: 101.3 fL — ABNORMAL HIGH (ref 80.0–100.0)
Platelets: 131 10*3/uL — ABNORMAL LOW (ref 150–400)
RBC: 4.59 MIL/uL (ref 4.22–5.81)
RDW: 13 % (ref 11.5–15.5)
WBC: 4.6 10*3/uL (ref 4.0–10.5)
nRBC: 0 % (ref 0.0–0.2)

## 2020-03-20 LAB — BASIC METABOLIC PANEL
Anion gap: 9 (ref 5–15)
BUN: 9 mg/dL (ref 6–20)
CO2: 22 mmol/L (ref 22–32)
Calcium: 9.9 mg/dL (ref 8.9–10.3)
Chloride: 107 mmol/L (ref 98–111)
Creatinine, Ser: 1 mg/dL (ref 0.61–1.24)
GFR, Estimated: >60 mL/min (ref >60)
Glucose, Bld: 125 mg/dL — ABNORMAL HIGH (ref 70–99)
Potassium: 4 mmol/L (ref 3.5–5.1)
Sodium: 138 mmol/L (ref 135–145)

## 2020-03-20 LAB — PROTIME-INR
INR: 1.6 — ABNORMAL HIGH (ref 0.8–1.2)
Prothrombin Time: 18.2 seconds — ABNORMAL HIGH (ref 11.4–15.2)

## 2020-03-20 LAB — RAPID URINE DRUG SCREEN, HOSP PERFORMED
Amphetamines: NOT DETECTED
Barbiturates: NOT DETECTED
Benzodiazepines: NOT DETECTED
Cocaine: NOT DETECTED
Opiates: NOT DETECTED
Tetrahydrocannabinol: POSITIVE — AB

## 2020-03-20 LAB — MAGNESIUM: Magnesium: 1.8 mg/dL (ref 1.7–2.4)

## 2020-03-20 LAB — TROPONIN I (HIGH SENSITIVITY)
Troponin I (High Sensitivity): 29 ng/L — ABNORMAL HIGH (ref <18)
Troponin I (High Sensitivity): 29 ng/L — ABNORMAL HIGH (ref <18)
Troponin I (High Sensitivity): 23 ng/L — ABNORMAL HIGH (ref <18)

## 2020-03-20 LAB — RESPIRATORY PANEL BY RT PCR (FLU A&B, COVID)
Influenza A by PCR: NEGATIVE
Influenza B by PCR: NEGATIVE
SARS Coronavirus 2 by RT PCR: NEGATIVE

## 2020-03-20 LAB — TSH: TSH: 1.782 u[IU]/mL (ref 0.350–4.500)

## 2020-03-20 LAB — APTT: aPTT: 44 seconds — ABNORMAL HIGH (ref 24–36)

## 2020-03-20 MED ORDER — METOPROLOL TARTRATE 50 MG PO TABS
50.0000 mg | ORAL_TABLET | Freq: Two times a day (BID) | ORAL | Status: DC
  Administered 2020-03-20 – 2020-03-21 (×3): 50 mg via ORAL
  Filled 2020-03-20 (×2): qty 1
  Filled 2020-03-20: qty 2

## 2020-03-20 MED ORDER — ATORVASTATIN CALCIUM 40 MG PO TABS
40.0000 mg | ORAL_TABLET | Freq: Every day | ORAL | Status: DC
  Administered 2020-03-20 – 2020-03-21 (×2): 40 mg via ORAL
  Filled 2020-03-20 (×2): qty 1

## 2020-03-20 MED ORDER — NICOTINE 14 MG/24HR TD PT24
14.0000 mg | MEDICATED_PATCH | Freq: Every day | TRANSDERMAL | Status: DC
  Administered 2020-03-20 – 2020-03-21 (×2): 14 mg via TRANSDERMAL
  Filled 2020-03-20 (×2): qty 1

## 2020-03-20 MED ORDER — RIVAROXABAN 20 MG PO TABS
20.0000 mg | ORAL_TABLET | Freq: Every day | ORAL | Status: DC
  Administered 2020-03-20: 20 mg via ORAL
  Filled 2020-03-20 (×2): qty 1

## 2020-03-20 MED ORDER — ONDANSETRON HCL 4 MG PO TABS
4.0000 mg | ORAL_TABLET | Freq: Once | ORAL | Status: AC
  Administered 2020-03-20: 4 mg via ORAL
  Filled 2020-03-20: qty 1

## 2020-03-20 MED ORDER — ACETAMINOPHEN 325 MG PO TABS: 650.0000 mg | ORAL_TABLET | Freq: Four times a day (QID) | ORAL | Status: DC | PRN

## 2020-03-20 NOTE — Progress Notes (Signed)
ANTICOAGULATION CONSULT NOTE - Initial Consult  Pharmacy Consult for Xarelto  Indication: atrial flutter  No Known Allergies  Patient Measurements: Height: 5\' 10"  (177.8 cm) Weight: 84.4 kg (186 lb) IBW/kg (Calculated) : 73  Vital Signs: Temp: 98.8 F (37.1 C) (10/21 1905) Temp Source: Oral (10/21 1905) BP: 154/110 (10/21 2335) Pulse Rate: 108 (10/21 2335)  Labs: Recent Labs    03/19/20 1609 03/19/20 1739 03/19/20 1939  HGB 15.3  --   --   HCT 46.4  --   --   PLT 155  --   --   CREATININE 0.96  --   --   TROPONINIHS  --  24* 32*    Estimated Creatinine Clearance: 92.9 mL/min (by C-G formula based on SCr of 0.96 mg/dL).   Medical History: Past Medical History:  Diagnosis Date  . Atrial flutter ([place]) 12/2017  . Tobacco abuse     Assessment: 54 y/o M with atrial flutter to begin anti-coagulation with Xarelto. He does have a history of atrial flutter but has not been on any anti-coagulation. CBC/renal function good. Getting EP consult. Already got one dose of Xarelto 20 mg in the ED earlier in the evening.   Goal of Therapy:  Monitor platelets by anticoagulation protocol: Yes   Plan:  Xarelto 20 mg daily with supper Daily CBC while inpatient  Monitor for bleeding  Narda Bonds, PharmD, BCPS Clinical Pharmacist Phone: [phone]

## 2020-03-20 NOTE — Progress Notes (Addendum)
Family Medicine Teaching Service Daily Progress Note Intern Pager: [phone]  Patient name: Kevin Mora Medical record number: [id number] Date of birth: Dec 23, 1965 Age: 54 y.o. Gender: male  Primary Care Provider: Martyn Malay, MD Consultants: Cardiology Code Status: Full  Pt Overview and Major Events to Date:  Admitted 03/19/20  Assessment and Plan: Pt is a 54 year old male with a history of a flutter and hypertension for which he was hospitalized in 2019.  He was discharged on diltiazem, xarelto and BP medications but he did not start them due to financial troubles. He is willing to start these medications if they are more affordable. He has been taking Aspirin 81 mg daily. He presented to the ED last night because his blood pressure and heart rate have increased over the past few days and he notes right sided chest discomfort.   A Flutter, treating  HR 49-108. A flutter on telemetry. Denies chest pain or dizziness. It does catch his breath sometimes. Echo on 10/22: EF 60-65%. Diastolic function was unable to be determined due to atrial flutter. S/p diliiazem gtt, now on PO meds for rate control  -Continuous telemetry  -Cardiology and EP consulted, appreciate recommendations: pt declined ablation yesterday so will follow up in cards clinic to discuss further. May need TEE/CV next week if unable to achieve rate control. -Continue medical management of Aflutter: Metoprolol 50mg  BID  -Continue Xarelto 20mg  once daily -TOC pharmacy for Xarelto   AKI, treating Cr 1.37 today, 1 yesterday. Likely pre-renal 2/2 dehydration -Encourage PO hydration -Avoid nephrotoxic agents -Monitor with BMP  Hyperbilirubinemia  Bili 1.6 today. Denies abdominal pain, no scleral icterus on exam.  -Monitor with CMP   HTN SBP 126-157  DBP 88-111  -Monitor BP for now. -Consider starting antihypertensive  -F/u as an outpatient with Dr. Owens Mora on 11/10.  HLD LDL 162 on admission, Total cholesterol is  230. Started on statin on 10/22 -Continue Atorvastatin 40mg  daily  Tobacco misuse disorder Has smoked 1/2 pack of cigarettes for the past 30 years. Still currently smokes. -nicotine patch 14 mg QD  Chronic low back pain, sciatica -Tylenol 650 mg Q6PRN   FEN/GI: Heart Healthy PPx: Rivaroxaban  Disposition: D/c 23rd or 24th   Subjective:  Feels well this morning. Denies chest pain or dizziness. He catches he breath sometimes. Reports one of the medications started yesterday caused nausea which improved with ginger ale.    Objective: Temp:  [98 F (36.7 C)-98.7 F (37.1 C)] 98.7 F (37.1 C) (10/23 0348) Pulse Rate:  [42-104] 78 (10/23 0348) Resp:  [13-39] 18 (10/23 0348) BP: (95-168)/(43-157) 157/111 (10/23 0348) SpO2:  [91 %-100 %] 99 % (10/23 0348) Weight:  [83.2 kg] 83.2 kg (10/23 0348)   Physical Exam: General: 54 yr old male, appears stated age, no acute distress Cardio: Irregular rhythm, No murmurs or rubs.   Pulm: CTAB, normal respiratory effort Abdomen: Bowel sounds normal. Abdomen soft and non-tender.  Extremities: No peripheral edema. Warm/ well perfused.  Neuro: Cranial nerves grossly intact  Laboratory: Recent Labs  Lab 03/19/20 1609 03/20/20 1221 03/21/20 0237  WBC 4.6 4.6 5.4  HGB 15.3 15.0 16.4  HCT 46.4 46.5 48.6  PLT 155 131* 168   Recent Labs  Lab 03/19/20 1609 03/20/20 0506 03/21/20 0237  NA 138 138 138  K 4.5 4.0 4.2  CL 107 107 105  CO2 24 22 24   BUN 9 9 12   CREATININE 0.96 1.00 1.37*  CALCIUM 10.1 9.9 10.1  PROT  --   --  6.8  BILITOT  --   --  1.6*  ALKPHOS  --   --  56  ALT  --   --  22  AST  --   --  28  GLUCOSE 99 125* 107*      Imaging/Diagnostic Tests: ECHOCARDIOGRAM COMPLETE  Result Date: 03/20/2020    ECHOCARDIOGRAM REPORT   Patient Name:   Kevin Mora Date of Exam: 03/20/2020 Medical Rec #:  [id number]      Height:       70.0 in Accession #:    [id number]     Weight:       186.0 lb Date of Birth:  1965-11-16        BSA:          2.024 m Patient Age:    50 years       BP:           111/79 mmHg Patient Gender: M              HR:           64 bpm. Exam Location:  Inpatient Procedure: 2D Echo Indications:    427.32 atrial flutter  History:        Patient has prior history of Echocardiogram examinations, most                 recent 01/30/2018. Arrythmias:Atrial Flutter; Risk Factors:Current                 Smoker. Tobacco abuse.  Sonographer:    Jannett Celestine RDCS (AE) Referring Phys: [id number] STEPHANIE [place]  1. Left ventricular ejection fraction, by estimation, is 60 to 65%. The left ventricle has normal function. The left ventricle has no regional wall motion abnormalities. Left ventricular diastolic function could not be evaluated due to underlying atrial  flutter.  2. Right ventricular systolic function is normal. The right ventricular size is mildly enlarged. Tricuspid regurgitation signal is inadequate for assessing PA pressure.  3. The mitral valve is normal in structure. Moderate mitral valve regurgitation. No evidence of mitral stenosis. Regurgitant volume 28cc, MR ERO 0.22cm2  4. The aortic valve is normal in structure. Aortic valve regurgitation is not visualized. No aortic stenosis is present.  5. The inferior vena cava is dilated in size with >50% respiratory variability, suggesting right atrial pressure of 8 mmHg. FINDINGS  Left Ventricle: Left ventricular ejection fraction, by estimation, is 45 to 50%. The left ventricle has mildly decreased function. The left ventricle demonstrates global hypokinesis. The left ventricular internal cavity size was normal in size. There is  mild asymmetric left ventricular hypertrophy of the septal segment. Left ventricular diastolic function could not be evaluated due to atrial flutter. Left ventricular diastolic function could not be evaluated. Right Ventricle: The right ventricular size is mildly enlarged. No increase in right ventricular wall thickness. Right  ventricular systolic function is normal. Tricuspid regurgitation signal is inadequate for assessing PA pressure. Left Atrium: Left atrial size was normal in size. Right Atrium: Right atrial size was normal in size. Pericardium: There is no evidence of pericardial effusion. Mitral Valve: The mitral valve is normal in structure. Moderate mitral valve regurgitation. No evidence of mitral valve stenosis. Tricuspid Valve: The tricuspid valve is normal in structure. Tricuspid valve regurgitation is trivial. No evidence of tricuspid stenosis. Aortic Valve: The aortic valve is normal in structure. Aortic valve regurgitation is not visualized. No aortic stenosis is present. Pulmonic Valve: The pulmonic valve was normal in  structure. Pulmonic valve regurgitation is not visualized. No evidence of pulmonic stenosis. Aorta: The aortic root is normal in size and structure. Venous: The inferior vena cava is dilated in size with greater than 50% respiratory variability, suggesting right atrial pressure of 8 mmHg. IAS/Shunts: No atrial level shunt detected by color flow Doppler.  LEFT VENTRICLE PLAX 2D LVIDd:         5.60 cm LVIDs:         4.20 cm LV PW:         1.10 cm LV IVS:        1.30 cm LVOT diam:     2.00 cm LV SV:         34 LV SV Index:   17 LVOT Area:     3.14 cm  RIGHT VENTRICLE TAPSE (M-mode): 1.4 cm LEFT ATRIUM             Index       RIGHT ATRIUM           Index LA diam:        4.80 cm 2.37 cm/m  RA Area:     19.20 cm LA Vol (A2C):   87.6 ml 43.28 ml/m RA Volume:   52.70 ml  26.04 ml/m LA Vol (A4C):   56.6 ml 27.97 ml/m LA Biplane Vol: 70.7 ml 34.93 ml/m  AORTIC VALVE LVOT Vmax:   67.10 cm/s LVOT Vmean:  54.700 cm/s LVOT VTI:    0.109 m  AORTA Ao Root diam: 2.50 cm MITRAL VALVE MV Area (PHT): 4.39 cm      SHUNTS MV Decel Time: 173 msec      Systemic VTI:  0.11 m MR Peak grad:    61.8 mmHg   Systemic Diam: 2.00 cm MR Mean grad:    42.0 mmHg MR Vmax:         393.00 cm/s MR Vmean:        306.0 cm/s MR PISA:          3.08 cm MR PISA Eff ROA: 22 mm MR PISA Radius:  0.70 cm MV E velocity: 135.00 cm/s Fransico Him MD Electronically signed by Fransico Him MD Signature Date/Time: 03/20/2020/10:57:42 AM    Final    Lattie Haw, MD 03/21/2020, 5:59 AM OMS-IV AI, [place] Intern pager: [phone], text pages welcome

## 2020-03-20 NOTE — ED Notes (Signed)
Ordered breakfast--Kevin Mora 

## 2020-03-20 NOTE — Consult Note (Addendum)
ELECTROPHYSIOLOGY CONSULT NOTE    Patient ID: Kevin Mora MRN: [id number], DOB/AGE: Jul 16, 1965 54 y.o.  Admit date: 03/19/2020 Date of Consult: 03/20/2020  Primary Physician: Martyn Malay, MD Primary Cardiologist: No primary care provider on file.  Electrophysiologist: New  Referring Provider: Dr. Hassell Done  Patient Profile: Kevin Mora is a 54 y.o. male with a history of mild chronic systolic CHF (EF 25-42% in 2019) paroxysmal atrial flutter, inability to afford medications due to no insurance, HTN, tobacco abuse who is being seen today for the evaluation of atrial flutter at the request of Dr. Hassell Done.  HPI:  Kevin Mora is a 54 y.o. male with medical history above.   He has carried a diagnosis of atrial flutter since 12/2017 but has had issues with med compliance due to cost.  Seen in ED for flank pain 10/2018 and HR recorded as 120 but no EKG (MD note mentions on his exam HRs were <100 and possible error).  Pt called PCP office 10/21 with c/o high HR and BP with "crushing chest pain at night" and advised to seek care ASAP. He went to UC, found to be in [place] with RVR and was sent to [facility] for further evaluation and treatment.   Pertinent labs include:  UDS + for THC HS troponin 24 -> 32 -> 29 Mg 1.8, Cr 0.96, K 4.5, WBC 4.6, Hgb 15.3. BNP 172. COVID negative.  TSH pending   He is feeling OK currently with HRs in 90-100s. Just "tired" from not sleeping well in ED. He states he has not stayed on medications in the past due to cost. He feels like he has had issues with his HRs since he was a "young man playing sports" but can't clarify further. He is not sure if he has been in flutter persistently since 2019, as his current symptoms started within the last couple of days.    Past Medical History:  Diagnosis Date  . Atrial flutter ([place]) 12/2017  . Tobacco abuse      Surgical History:  Past Surgical History:  Procedure Laterality Date  . APPENDECTOMY    . LIPOMA  EXCISION     right shoulder  . WISDOM TOOTH EXTRACTION       (Not in a hospital admission)   Inpatient Medications:  . nicotine  14 mg Transdermal Once  . rivaroxaban  20 mg Oral Q supper    Allergies: No Known Allergies  Social History   Socioeconomic History  . Marital status: Single    Spouse name: Not on file  . Number of children: Not on file  . Years of education: Not on file  . Highest education level: Not on file  Occupational History  . Not on file  Tobacco Use  . Smoking status: Current Every Day Smoker    Packs/day: 0.50    Years: 30.00    Pack years: 15.00    Types: Cigarettes  . Smokeless tobacco: Never Used  Vaping Use  . Vaping Use: Never used  Substance and Sexual Activity  . Alcohol use: Yes    Comment: Socially   . Drug use: Yes    Types: Marijuana  . Sexual activity: Yes    Partners: Male  Other Topics Concern  . Not on file  Social History Narrative   Previously worked in [employer], educational now just working intermittently.  He lives with his mother.  He reports his other 2 siblings have passed away.   Social Determinants of Health   Financial  Resource Strain:   . Difficulty of Paying Living Expenses: Not on file  Food Insecurity:   . Worried About Charity fundraiser in the Last Year: Not on file  . Ran Out of Food in the Last Year: Not on file  Transportation Needs:   . Lack of Transportation (Medical): Not on file  . Lack of Transportation (Non-Medical): Not on file  Physical Activity:   . Days of Exercise per Week: Not on file  . Minutes of Exercise per Session: Not on file  Stress:   . Feeling of Stress : Not on file  Social Connections:   . Frequency of Communication with Friends and Family: Not on file  . Frequency of Social Gatherings with Friends and Family: Not on file  . Attends Religious Services: Not on file  . Active Member of Clubs or Organizations: Not on file  . Attends Archivist Meetings: Not on file  .  Marital Status: Not on file  Intimate Partner Violence:   . Fear of Current or Ex-Partner: Not on file  . Emotionally Abused: Not on file  . Physically Abused: Not on file  . Sexually Abused: Not on file     Family History  Problem Relation Age of Onset  . Diabetes Mother   . Hypertension Mother   . Coronary artery disease Father      Review of Systems: All other systems reviewed and are otherwise negative except as noted above.  Physical Exam: Vitals:   03/20/20 0645 03/20/20 0715 03/20/20 0730 03/20/20 0800  BP: (!) 124/96 (!) 142/106 111/79 (!) 95/43  Pulse:  64 64 64  Resp: (!) 33 (!) 22 (!) 24 18  Temp:      TempSrc:      SpO2: 96% 97% 97% 95%  Weight:      Height:        GEN- The patient is well appearing, alert and oriented x 3 today.   HEENT: normocephalic, atraumatic; sclera clear, conjunctiva pink; hearing intact; oropharynx clear; neck supple Lungs- Clear to ausculation bilaterally, normal work of breathing.  No wheezes, rales, rhonchi Heart- Regular rate and rhythm, no murmurs, rubs or gallops GI- soft, non-tender, non-distended, bowel sounds present Extremities- no clubbing, cyanosis, or edema; DP/PT/radial pulses 2+ bilaterally MS- no significant deformity or atrophy Skin- warm and dry, no rash or lesion Psych- euthymic mood, full affect Neuro- strength and sensation are intact  Labs:   Lab Results  Component Value Date   WBC 4.6 03/19/2020   HGB 15.3 03/19/2020   HCT 46.4 03/19/2020   MCV 98.1 03/19/2020   PLT 155 03/19/2020    Recent Labs  Lab 03/20/20 0506  NA 138  K 4.0  CL 107  CO2 22  BUN 9  CREATININE 1.00  CALCIUM 9.9  GLUCOSE 125*      Radiology/Studies: DG Chest 2 View  Result Date: 03/19/2020 CLINICAL DATA:  Hypertension EXAM: CHEST - 2 VIEW COMPARISON:  01/04/2018 FINDINGS: The heart size is upper limits of normal, unchanged. Both lungs are clear. The visualized skeletal structures are unremarkable. IMPRESSION: No active  cardiopulmonary disease. Electronically Signed   By: Davina Poke D.O.   On: 03/19/2020 16:32    EKG: on arrival shows typical appearing atrial flutter at 106 bpm (personally reviewed) EKG 12/2017 shows similar.  No EKGs in the interim. Seen in ED 10/2018 with flank pain (thought to be sciatica. HRs in 120s. No EKG.   TELEMETRY: Atrial flutter with rates  in 70-120s (personally reviewed)  Assessment/Plan: 1.  Typical appearing atrial flutter He has not been on [place]. CHA2DS2VASC of at least 2 for h/o CHF and HTN.   Previously prescribed Xarelto and diltiazem but didn't fill due to cost.    On initial discussion pt is not excited about the prospect of a surgery/ablation.  Marlet Korte discuss with MD. May be reasonable to treat medically and consider ablation as outpatient based on symptoms and follow up. Some of this may also dependent on if he has tachy mediated CMP.  Neftali Abair ask case management to see if we are able to get him xarelto.   2. HTN Had frontal headache this am. Yola Paradiso need medical therapy   3. Chronic systolic CHF Echo 07/8451 with LVEF 40-45% Echo pending. ? Tachy medicated component.  Echo today suprisingly shows EF 60-65%. Hopefully Kaleesi Guyton remain stable if can get rate under control and AFL treated.   ADDENDUM Dr. Curt Bears has seen patient.   Clarified with patient that he had been NPO since midnight, but is not interested in TEE/DCCV at this time. He would like to try the medications (with case management assistance for [facility]) and follow up as an outpatient to discuss ablation.  Lateya Dauria need social work as well to help with Medicaid vs [employer].   Discussed above with primary team. If we are able to rate control patient on oral medications and arrange [facility], he can go home today with close EP follow up to discuss ablation.   For questions or updates, please contact [place] Please consult [email or web address] for contact info under Cardiology/STEMI.  Signed, Shirley Friar, PA-C   03/20/2020 8:18 AM   I have seen and examined this patient with Oda Kilts.  Agree with above, note added to reflect my findings.  On exam, irregular, no murmurs.  Presented in atiral flutter with rapid rates. Controlled with diltiazem. Shoshannah Faubert start metoprolol 50 mg BID for rate control and xarelto for anticoagulation. If rate controlled, Morris Longenecker arrange follow up in clinic to further discuss ablation. If not controlled Brooklynne Pereida need to stay in the hospital for likely TEE/CV next week.  Sofia Jaquith M. Massimo Hartland MD 03/20/2020 3:45 PM

## 2020-03-20 NOTE — ED Notes (Signed)
Assumed care of patient. Patient continues on dilt drip. Patient resting with eyes closed. Awaiting admission.

## 2020-03-20 NOTE — Progress Notes (Addendum)
Family Medicine Teaching Service Daily Progress Note Intern Pager: [phone]  Patient name: Kevin Mora Medical record number: [id number] Date of birth: [birth year] Age: 54 y.o. Gender: male  Primary Care Provider: Martyn Malay, MD Consultants: Cardiology Code Status: Full  Pt Overview and Major Events to Date:  Admitted 03/19/20  Assessment and Plan: Pt is a 54 year old male with a history of a flutter and hypertension for which he was hospitalized in 2019.  He was discharged on diltiazem, xarelto and BP medications but he did not start them due to financial troubles. He is willing to start these medications if they are more affordable. He has been taking Aspirin 81 mg daily. He presented to the ED last night because his blood pressure and heart rate have increased over the past few days and he notes right sided chest discomfort.   A Flutter Pt was diagnosed with paroxysmal a flutter in 2019 and was non compliant with medications.  He noted palpitations and right sided chest discomfort over the past couple days, went to urgent care was found to have a HR of >130, was in a flutter, and was sent to the ED where he had a HR of 131, and atrial flutter with 2:1 AV conduction. CHADS-VASc score of 2 and annual stroke risk of 2.2%  Today, he is in atrial flutter on the monitor with a 4:1 then 3:1 AV conduction. -Cardiology consulted -Diltiazem drip started, switch to Diltiazem PO once HR stabilizes -Xarelto started - Cardiology states he may need an ablation -TTE ordered -EKG repeated this morning -Troponin of 29 today  HFmrEF TTE in 2019 showed an EF of 40-45%, mildly dilated left and right atrium and right ventricle.  BNP on admission was 172. Has a difficult time sleeping at night and usually ends up in the recliner. -TTE ordered by cardiology  Chest discomfort Pt notes right sided chest discomfort over the past 2 days -Cardiology states he may need LHC to r/o obstructive CAD  HTN Pt  notes BP as high as 220/130 at home on 10/20.  On admission BP was 146/119.  He admits to headaches in the recent  past but does not currently have a headache today. -Diltiazem drip -Has an appointment with Dr. Owens Shark on 11/10.  HLD LDL today is 162, Total cholesterol is 230.  -Waiting for pending A1c to calculate ASCVD risk  Tobacco misuse disorder Has smoked 1/2 pack of cigarettes for the past 30 years. Still currently smokes. -nicotine patch 14 mg QD  Chronic low back pain, sciatica -Tylenol 650 mg Q6PRN    FEN/GI: Heart Healthy PPx: Rivaroxaban  Disposition: anticipate in patient work up for the next few days  Subjective:  Pt was resting comfortably in bed this morning and in no acute distress. He denies headache or current chest pain.  He states he will be compliant with medications ordered and will take medications upon discharge as long as they are affordable for him.   Objective: Temp:  [98.4 F (36.9 C)-98.8 F (37.1 C)] 98.8 F (37.1 C) (10/21 1905) Pulse Rate:  [38-129] 64 (10/22 0800) Resp:  [0-33] 18 (10/22 0800) BP: (95-169)/(43-146) 95/43 (10/22 0800) SpO2:  [44 %-100 %] 95 % (10/22 0800) Weight:  [84.4 kg] 84.4 kg (10/21 1607) Physical Exam: General: Tired but alert, in no acute distress Cardiovascular: currently in a flutter, irregular rhythm, no murmur Respiratory: CTA bilaterally Abdomen: non distended Extremities: no edema, no gross deformities  Laboratory: Recent Labs  Lab 03/19/20 1609  WBC 4.6  HGB 15.3  HCT 46.4  PLT 155   Recent Labs  Lab 03/19/20 1609 03/20/20 0506  NA 138 138  K 4.5 4.0  CL 107 107  CO2 24 22  BUN 9 9  CREATININE 0.96 1.00  CALCIUM 10.1 9.9  GLUCOSE 99 125*      Imaging/Diagnostic Tests: DG Chest 2 View  Result Date: 03/19/2020 CLINICAL DATA:  Hypertension EXAM: CHEST - 2 VIEW COMPARISON:  01/04/2018 FINDINGS: The heart size is upper limits of normal, unchanged. Both lungs are clear. The visualized  skeletal structures are unremarkable. IMPRESSION: No active cardiopulmonary disease. Electronically Signed   By: Davina Poke D.O.   On: 03/19/2020 16:32   Micheline Rough, Medical Student 03/20/2020, 8:28 AM OMS-IV AI, [place] Intern pager: [phone], text pages welcome     [place] NOTE   I personally evaluated this patient along with the student, and verified all aspects of the history, physical exam, and medical decision making as documented by the student. I agree with the student's documentation and have made all necessary edits.  Lyndee Hensen, DO PGY-1, [facility] 03/20/2020 5:23 PM

## 2020-03-20 NOTE — Progress Notes (Signed)
  Echocardiogram 2D Echocardiogram has been performed.  Kevin Mora 03/20/2020, 10:17 AM

## 2020-03-21 DIAGNOSIS — R000 Tachycardia, unspecified: Secondary | ICD-10-CM

## 2020-03-21 LAB — COMPREHENSIVE METABOLIC PANEL
ALT: 22 U/L (ref 0–44)
AST: 28 U/L (ref 15–41)
Albumin: 3.7 g/dL (ref 3.5–5.0)
Alkaline Phosphatase: 56 U/L (ref 38–126)
Anion gap: 9 (ref 5–15)
BUN: 12 mg/dL (ref 6–20)
CO2: 24 mmol/L (ref 22–32)
Calcium: 10.1 mg/dL (ref 8.9–10.3)
Chloride: 105 mmol/L (ref 98–111)
Creatinine, Ser: 1.37 mg/dL — ABNORMAL HIGH (ref 0.61–1.24)
GFR, Estimated: >60 mL/min (ref >60)
Glucose, Bld: 107 mg/dL — ABNORMAL HIGH (ref 70–99)
Potassium: 4.2 mmol/L (ref 3.5–5.1)
Sodium: 138 mmol/L (ref 135–145)
Total Bilirubin: 1.6 mg/dL — ABNORMAL HIGH (ref 0.3–1.2)
Total Protein: 6.8 g/dL (ref 6.5–8.1)

## 2020-03-21 LAB — CBC
HCT: 48.6 % (ref 39.0–52.0)
Hemoglobin: 16.4 g/dL (ref 13.0–17.0)
MCH: 32.9 pg (ref 26.0–34.0)
MCHC: 33.7 g/dL (ref 30.0–36.0)
MCV: 97.6 fL (ref 80.0–100.0)
Platelets: 168 10*3/uL (ref 150–400)
RBC: 4.98 MIL/uL (ref 4.22–5.81)
RDW: 12.9 % (ref 11.5–15.5)
WBC: 5.4 10*3/uL (ref 4.0–10.5)
nRBC: 0 % (ref 0.0–0.2)

## 2020-03-21 LAB — HEMOGLOBIN A1C
Hgb A1c MFr Bld: 5.7 % — ABNORMAL HIGH (ref 4.8–5.6)
Mean Plasma Glucose: 117 mg/dL

## 2020-03-21 MED ORDER — ATORVASTATIN CALCIUM 40 MG PO TABS
40.0000 mg | ORAL_TABLET | Freq: Every day | ORAL | 0 refills | Status: DC
Start: 2020-03-22 — End: 2020-04-10

## 2020-03-21 MED ORDER — RIVAROXABAN 20 MG PO TABS
20.0000 mg | ORAL_TABLET | Freq: Every day | ORAL | Status: DC
Start: 2020-03-21 — End: 2020-04-10

## 2020-03-21 MED ORDER — METOPROLOL TARTRATE 50 MG PO TABS
50.0000 mg | ORAL_TABLET | Freq: Two times a day (BID) | ORAL | 0 refills | Status: DC
Start: 2020-03-21 — End: 2020-03-30

## 2020-03-21 MED ORDER — INFLUENZA VAC SPLIT QUAD 0.5 ML IM SUSY: 0.5000 mL | PREFILLED_SYRINGE | INTRAMUSCULAR | Status: DC

## 2020-03-21 NOTE — Discharge Summary (Signed)
[facility] Discharge Summary  Patient name: Kevin Mora Medical record number: [id number] Date of birth: 05-21-1966 Age: 53 y.o. Gender: male Date of Admission: 03/19/2020  Date of Discharge:  Admitting Physician: Blane Ohara McDiarmid, MD  Primary Care Provider: Martyn Malay, MD Consultants: Cardiology, EP   Indication for Hospitalization: Atrial flutter   Discharge Diagnoses/Problem List:  Atrial flutter HTN  Tobacco misuse disorder  Disposition: Home   Discharge Condition: medically stable for discharge   Discharge Exam:  BP (!) 159/134 (BP Location: Left Arm)   Pulse 92   Temp 98.2 F (36.8 C) (Oral)   Resp 20   Ht 5\' 10"  (1.778 m)   Wt 83.2 kg   SpO2 99%   BMI 26.32 kg/m   General: 54 yr old male, appears stated age, no acute distress Cardio: Irregular rhythm, No murmurs or rubs.   Pulm: CTAB, normal respiratory effort Abdomen: Bowel sounds normal. Abdomen soft and non-tender.  Extremities: No peripheral edema. Warm/ well perfused.  Neuro: Cranial nerves grossly intact   Brief Hospital Course:   Atrial flutter Pt presented with Atrial flutter with HR 130s. EKG 2:1 conduction block. Pt received metoprolol in the ED without adequate rate control.  CHA2DS2-VASc 2. He was started on diltiazem drip which she continued for the first day of admission.  He was then transitioned to oral metoprolol 50 mg twice daily Xarelto 20 mg.  Echo showed ejection fraction of 60 to 65%. Cardiology and EP were consulted who recommended ablation however patient declined.  He will be monitored as an outpatient in their clinic.  Patient discharged on a Saturday so [place] unable to pride pt with xarelto to go home with.  Pt was given a months' worth of xarelto samples from [facility] clinic.  Pt stated he would be able to afford metoprolol and atorvastatin from [employer].    Hypertension Blood pressures remained elevated in the hospital to 631S to 970Y systolic.   Patient was started on metoprolol tartrate.  Will need further HTN management with pcp.    Hyperlipidemia LDL 162 on admission.  Patient was started on atorvastatin 40 mg.   Issues for Follow Up:  1. Ensure follow up with cardiology for outpatient ablation  2. Pt will need help with paying for DOAC. Currently uninsured. 3. CMP to check Creatinine and bilirubin, which was slightly increased from baseline on discharge 4. Ensure compliance with metoprolol, statin, and xarelto  5. Monitor blood pressure as persistently hypertensive in hospital, started on metoprolol 6. Encourage smoking cessation 7. A1c 5.7, monitor  Significant Procedures: none  Significant Labs and Imaging:  Recent Labs  Lab 03/19/20 1609 03/20/20 1221 03/21/20 0237  WBC 4.6 4.6 5.4  HGB 15.3 15.0 16.4  HCT 46.4 46.5 48.6  PLT 155 131* 168   Recent Labs  Lab 03/19/20 1609 03/19/20 1609 03/20/20 0025 03/20/20 0506 03/21/20 0237  NA 138  --   --  138 138  K 4.5   < >  --  4.0 4.2  CL 107  --   --  107 105  CO2 24  --   --  22 24  GLUCOSE 99  --   --  125* 107*  BUN 9  --   --  9 12  CREATININE 0.96  --   --  1.00 1.37*  CALCIUM 10.1  --   --  9.9 10.1  MG  --   --  1.8  --   --  ALKPHOS  --   --   --   --  56  AST  --   --   --   --  28  ALT  --   --   --   --  22  ALBUMIN  --   --   --   --  3.7   < > = values in this interval not displayed.    Results/Tests Pending at Time of Discharge: none  Discharge Medications:  Allergies as of 03/21/2020   No Known Allergies     Medication List    STOP taking these medications   aspirin EC 81 MG tablet   methocarbamol 500 MG tablet Commonly known as: ROBAXIN   oxyCODONE-acetaminophen 5-325 MG tablet Commonly known as: Percocet   predniSONE 20 MG tablet Commonly known as: DELTASONE     TAKE these medications   atorvastatin 40 MG tablet Commonly known as: LIPITOR Take 1 tablet (40 mg total) by mouth daily.   metoprolol tartrate 50 MG  tablet Commonly known as: LOPRESSOR Take 1 tablet (50 mg total) by mouth 2 (two) times daily.   rivaroxaban 20 MG Tabs tablet Commonly known as: XARELTO Take 1 tablet (20 mg total) by mouth daily with supper.       Discharge Instructions: Please refer to Patient Instructions section of EMR for full details.  Patient was counseled important signs and symptoms that should prompt return to medical care, changes in medications, dietary instructions, activity restrictions, and follow up appointments.   Follow-Up Appointments:  Follow-up Information    Martyn Malay, MD Follow up.   Specialty: Family Medicine Why: Appt at 10:30a on 04/10/2020 Contact information: [place] [address] [phone]        Constance Haw, MD Follow up on 04/16/2020.   Specialty: Cardiology Why: at 245 pm to discuss possible cardiac procedure Contact information: [address] [place] [address] [place] Follow up on 03/26/2020.   Specialty: Cardiology Why: at 1100 am for post hospital follow up. Patient entrance C. Underground parking on your right. Code for October 3009.  Contact information: [address] [id number] mc [address] [place] [phone]              Benay Pike, MD 03/22/2020, 5:35 PM PGY-3, [place]

## 2020-03-21 NOTE — Progress Notes (Signed)
   Progress Note   Subjective   Doing well today, the patient denies CP or SOB.  No new concerns  Inpatient Medications    Scheduled Meds: . atorvastatin  40 mg Oral Daily  . metoprolol tartrate  50 mg Oral BID  . nicotine  14 mg Transdermal Daily  . rivaroxaban  20 mg Oral Q supper   Continuous Infusions:  PRN Meds: acetaminophen   Vital Signs    Vitals:   03/20/20 2339 03/21/20 0348 03/21/20 0611 03/21/20 0612  BP: (!) 143/87 (!) 157/111 (!) 151/100 138/60  Pulse: 66 78    Resp: 20 18    Temp: 98 F (36.7 C) 98.7 F (37.1 C)    TempSrc: Oral Oral    SpO2:  99%    Weight:  83.2 kg    Height:        Intake/Output Summary (Last 24 hours) at 03/21/2020 0849 Last data filed at 03/20/2020 1710 Gross per 24 hour  Intake 265.89 ml  Output --  Net 265.89 ml   Filed Weights   03/19/20 1607 03/21/20 0348  Weight: 84.4 kg 83.2 kg    Telemetry    Rate controlled atrial flutter - Personally Reviewed  Physical Exam   GEN- The patient is well appearing, sleeping but rouses Head- normocephalic, atraumatic Eyes-  Sclera clear, conjunctiva pink Ears- hearing intact Oropharynx- clear Neck- supple, Lungs-  normal work of breathing Heart- irregular rate and rhythm  GI- soft  Extremities- no clubbing, cyanosis, or edema  MS- no significant deformity or atrophy Skin- no rash or lesion Psych- euthymic mood, full affect Neuro- strength and sensation are intact   Labs    Chemistry Recent Labs  Lab 03/19/20 1609 03/20/20 0506 03/21/20 0237  NA 138 138 138  K 4.5 4.0 4.2  CL 107 107 105  CO2 24 22 24   GLUCOSE 99 125* 107*  BUN 9 9 12   CREATININE 0.96 1.00 1.37*  CALCIUM 10.1 9.9 10.1  PROT  --   --  6.8  ALBUMIN  --   --  3.7  AST  --   --  28  ALT  --   --  22  ALKPHOS  --   --  56  BILITOT  --   --  1.6*  GFRNONAA >60 >60 >60  ANIONGAP 7 9 9      Hematology Recent Labs  Lab 03/19/20 1609 03/20/20 1221 03/21/20 0237  WBC 4.6 4.6 5.4  RBC  4.73 4.59 4.98  HGB 15.3 15.0 16.4  HCT 46.4 46.5 48.6  MCV 98.1 101.3* 97.6  MCH 32.3 32.7 32.9  MCHC 33.0 32.3 33.7  RDW 12.8 13.0 12.9  PLT 155 131* 168     Patient ID  Kevin Mora is a 54 y.o. male with a history of mild chronic systolic CHF (EF 95-62% in 2019) paroxysmal atrial flutter, inability to afford medications due to no insurance, HTN, tobacco abuse who is being seen today for the evaluation of atrial flutter at the request of Dr. Hassell Done.  Assessment & Plan    1.  Typical atrial flutter Reasonably rate controlled He is anticoagulated with xarelto. No changes advised Ok to discharge from EP standpoint. He already has follow-up arranged in the AF clinic  2. HTN Consider increasing metoprolol +/- adding norvasc.  Will defer to primary team.  Electrophysiology team to see as needed while here. Please call with questions.  Thompson Grayer MD, [facility] 03/21/2020 8:49 AM

## 2020-03-21 NOTE — TOC Transition Note (Signed)
Transition of Care [facility]) - CM/SW Discharge Note   Patient Details  Name: Kevin Mora MRN: [id number] Date of Birth: Dec 27, 1965  Transition of Care [facility]) CM/SW Contact:  Sharin Mons, RN Phone Number:  03/21/2020, 4:46 PM   Clinical Narrative:     Patient will DC to: home Anticipated DC date: 03/21/2020 Family notified:mom Transport by: car  Admitted a. Flutter. From home with mother. Per MD patient ready for DC today. RN, patient, and  patient's family notified of DC. Xarelto patient assistance form provided to pt and explained per NCM. Pt to complete his section and have MD @ A. FIB clinic appointment complete MD section and request office to fax to manufacturer. Pt stated has access to obtaining all other medications from [facility].  RNCM will sign off for now as intervention is no longer needed. Please consult [place] again if new needs arise.   Final next level of care: Home/Self Care Barriers to Discharge: No Barriers Identified   Patient Goals and CMS Choice        Discharge Placement                       Discharge Plan and Services                                     Social Determinants of Health (SDOH) Interventions     Readmission Risk Interventions No flowsheet data found.

## 2020-03-21 NOTE — Discharge Instructions (Signed)
You will need to go to your Cardiology appointment on Thursday October 28th.   You have an appointment with the family medicine center on November 1st with Dr. Jeannine Kitten.   We made the following medication changes:  - stop taking your aspirin until you speak with your cardiologist.  - start taking xarelto once a day with a meal - start taking lipitor daily for your cholesterol - start taking metoprolol twice a day for your heart rate.    If you develop chest pain or trouble breathing, or if you have uncontrolled bleeding, please seek medical attention.

## 2020-03-26 ENCOUNTER — Encounter (HOSPITAL_COMMUNITY): Payer: Self-pay | Admitting: Physician Assistant

## 2020-03-26 ENCOUNTER — Other Ambulatory Visit: Payer: Self-pay

## 2020-03-26 ENCOUNTER — Ambulatory Visit (HOSPITAL_COMMUNITY)
Admit: 2020-03-26 | Discharge: 2020-03-26 | Disposition: A | Discharge status: Home / Self Care | Payer: Self-pay | Attending: Physician Assistant | Admitting: Physician Assistant

## 2020-03-26 VITALS — BP 144/88 | HR 119 | Ht 70.0 in | Wt 187.0 lb

## 2020-03-26 DIAGNOSIS — I483 Typical atrial flutter: Secondary | ICD-10-CM

## 2020-03-26 DIAGNOSIS — Z8249 Family history of ischemic heart disease and other diseases of the circulatory system: Secondary | ICD-10-CM | POA: Insufficient documentation

## 2020-03-26 DIAGNOSIS — Z7901 Long term (current) use of anticoagulants: Secondary | ICD-10-CM | POA: Insufficient documentation

## 2020-03-26 DIAGNOSIS — I110 Hypertensive heart disease with heart failure: Secondary | ICD-10-CM | POA: Insufficient documentation

## 2020-03-26 DIAGNOSIS — I5022 Chronic systolic (congestive) heart failure: Secondary | ICD-10-CM | POA: Insufficient documentation

## 2020-03-26 DIAGNOSIS — F17210 Nicotine dependence, cigarettes, uncomplicated: Secondary | ICD-10-CM | POA: Insufficient documentation

## 2020-03-26 DIAGNOSIS — Z79899 Other long term (current) drug therapy: Secondary | ICD-10-CM | POA: Insufficient documentation

## 2020-03-26 DIAGNOSIS — D6869 Other thrombophilia: Secondary | ICD-10-CM

## 2020-03-26 NOTE — Progress Notes (Signed)
Primary Care Physician: Martyn Malay, MD Primary Electrophysiologist: Dr Curt Bears Referring Physician: Dr Rosilyn Mings Kevin Mora is a 54 y.o. male with a history of chronic systolic CHF, atrial flutter, HTN, and tobacco abuse who presents for follow up in the [facility].  The patient was initially diagnosed with atrial flutter in 2019 but has not followed up with cardiology since. Patient is on Xarelto for a CHADS2VASC score of 2. Patient called PCP office 10/21 with c/o high HR and BP with "crushing chest pain at night" and advised to seek care ASAP. He went to UC, found to be in [place] with RVR and was sent to [facility] for further evaluation and treatment. Patient was seen by EP but he declined TEE/DCCV or ablation at this time and opted for medical management. He was started on metoprolol for rate control. Since leaving the hospital, he does have symptoms of heart racing while laying down at night. He is unaware if his arrhythmia during the day.   Today, he denies symptoms of chest pain, shortness of breath, orthopnea, PND, lower extremity edema, dizziness, presyncope, syncope, snoring, daytime somnolence, bleeding, or neurologic sequela. The patient is tolerating medications without difficulties and is otherwise without complaint today.    Atrial Fibrillation Risk Factors:  he does not have symptoms or diagnosis of sleep apnea. he does not have a history of rheumatic fever.   he has a BMI of Body mass index is 26.83 kg/m.Marland Kitchen Filed Weights   03/26/20 1044  Weight: 84.8 kg    Family History  Problem Relation Age of Onset   Diabetes Mother    Hypertension Mother    Coronary artery disease Father      Atrial Fibrillation Management history:  Previous antiarrhythmic drugs: none Previous cardioversions: none Previous ablations: none CHADS2VASC score: 2 Anticoagulation history: Xarelto    Past Medical History:  Diagnosis Date   Atrial  flutter ([place]) 12/2017   Tobacco abuse    Past Surgical History:  Procedure Laterality Date   APPENDECTOMY     LIPOMA EXCISION     right shoulder   WISDOM TOOTH EXTRACTION      Current Outpatient Medications  Medication Sig Dispense Refill   atorvastatin (LIPITOR) 40 MG tablet Take 1 tablet (40 mg total) by mouth daily. 30 tablet 0   metoprolol tartrate (LOPRESSOR) 50 MG tablet Take 1 tablet (50 mg total) by mouth 2 (two) times daily. 60 tablet 0   rivaroxaban (XARELTO) 20 MG TABS tablet Take 1 tablet (20 mg total) by mouth daily with supper. 30 tablet    No current facility-administered medications for this encounter.    No Known Allergies  Social History   Socioeconomic History   Marital status: Single    Spouse name: Not on file   Number of children: Not on file   Years of education: Not on file   Highest education level: Not on file  Occupational History   Not on file  Tobacco Use   Smoking status: Current Every Day Smoker    Packs/day: 0.50    Years: 30.00    Pack years: 15.00    Types: Cigarettes   Smokeless tobacco: Never Used  Vaping Use   Vaping Use: Never used  Substance and Sexual Activity   Alcohol use: Yes    Comment: Socially    Drug use: Yes    Types: Marijuana   Sexual activity: Yes    Partners: Male  Other Topics Concern  Not on file  Social History Narrative   Previously worked in Psychologist, educational now just working intermittently.  He lives with his mother.  He reports his other 2 siblings have passed away.   Social Determinants of Health   Financial Resource Strain:    Difficulty of Paying Living Expenses: Not on file  Food Insecurity:    Worried About Charity fundraiser in the Last Year: Not on file   [employer] of Food in the Last Year: Not on file  Transportation Needs:    Lack of Transportation (Medical): Not on file   Lack of Transportation (Non-Medical): Not on file  Physical Activity:    Days of Exercise per  Week: Not on file   Minutes of Exercise per Session: Not on file  Stress:    Feeling of Stress : Not on file  Social Connections:    Frequency of Communication with Friends and Family: Not on file   Frequency of Social Gatherings with Friends and Family: Not on file   Attends Religious Services: Not on file   Active Member of Clubs or Organizations: Not on file   Attends Archivist Meetings: Not on file   Marital Status: Not on file  Intimate Partner Violence:    Fear of Current or Ex-Partner: Not on file   Emotionally Abused: Not on file   Physically Abused: Not on file   Sexually Abused: Not on file     ROS- All systems are reviewed and negative except as per the HPI above.  Physical Exam: Vitals:   03/26/20 1044  BP: (!) 144/88  Pulse: (!) 119  Weight: 84.8 kg  Height: 5\' 10"  (1.778 m)    GEN- The patient is well appearing, alert and oriented x 3 today.   Head- normocephalic, atraumatic Eyes-  Sclera clear, conjunctiva pink Ears- hearing intact Oropharynx- clear Neck- supple  Lungs- Clear to ausculation bilaterally, normal work of breathing Heart- irregular rate and rhythm, no murmurs, rubs or gallops  GI- soft, NT, ND, + BS Extremities- no clubbing, cyanosis, or edema MS- no significant deformity or atrophy Skin- no rash or lesion Psych- euthymic mood, full affect Neuro- strength and sensation are intact  Wt Readings from Last 3 Encounters:  03/26/20 84.8 kg  03/21/20 83.2 kg  11/14/18 90.7 kg    EKG today demonstrates typical atrial flutter with variable block HR 99, QRS 88, QTc 382  Echo 03/20/20 demonstrated  1. Left ventricular ejection fraction, by estimation, is 60 to 65%. The  left ventricle has normal function. The left ventricle has no regional  wall motion abnormalities. Left ventricular diastolic function could not  be evaluated due to underlying atrial  flutter.  2. Right ventricular systolic function is normal. The  right ventricular  size is mildly enlarged. Tricuspid regurgitation signal is inadequate for  assessing PA pressure.  3. The mitral valve is normal in structure. Moderate mitral valve  regurgitation. No evidence of mitral stenosis. Regurgitant volume 28cc, MR  ERO 0.22cm2  4. The aortic valve is normal in structure. Aortic valve regurgitation is  not visualized. No aortic stenosis is present.  5. The inferior vena cava is dilated in size with >50% respiratory  variability, suggesting right atrial pressure of 8 mmHg.   Epic records are reviewed at length today  CHA2DS2-VASc Score = 2  The patient's score is based upon: CHF History: 1 HTN History: 1 Diabetes History: 0 Stroke History: 0 Vascular Disease History: 0 Age Score: 0 Gender  Score: 0      ASSESSMENT AND PLAN: 1. Typical atrial flutter The patient's CHA2DS2-VASc score is 2, indicating a 2.2% annual risk of stroke.   We discussed therapeutic options today including DCCV vs ablation. Patient is very clear that he would like to pursue ablation. He has been compliant with his Xarelto. More samples given today.   Continue Lopressor 50 mg BID. He has not taken his morning dose yet. Continue Xarelto 20 mg daily  2. Secondary Hypercoagulable State (ICD10:  D68.69) The patient is at significant risk for stroke/thromboembolism based upon his CHA2DS2-VASc Score of 2.  Continue Rivaroxaban (Xarelto).   3. HTN Stable, no changes today.  4. Chronic systolic CHF Recent echo showed recovered EF 60-65%   Follow up with Dr Curt Bears as scheduled.    [facility] [address] [place], [place] [address] [phone] 03/26/2020 11:33 AM

## 2020-03-30 ENCOUNTER — Encounter: Payer: Self-pay | Admitting: Family Medicine

## 2020-03-30 ENCOUNTER — Other Ambulatory Visit: Payer: Self-pay

## 2020-03-30 ENCOUNTER — Ambulatory Visit (INDEPENDENT_AMBULATORY_CARE_PROVIDER_SITE_OTHER): Payer: Self-pay | Admitting: Family Medicine

## 2020-03-30 VITALS — BP 150/90 | HR 126 | Ht 70.0 in | Wt 183.5 lb

## 2020-03-30 DIAGNOSIS — F17200 Nicotine dependence, unspecified, uncomplicated: Secondary | ICD-10-CM

## 2020-03-30 DIAGNOSIS — I483 Typical atrial flutter: Secondary | ICD-10-CM

## 2020-03-30 DIAGNOSIS — E7800 Pure hypercholesterolemia, unspecified: Secondary | ICD-10-CM

## 2020-03-30 MED ORDER — METOPROLOL TARTRATE 100 MG PO TABS
100.0000 mg | ORAL_TABLET | Freq: Two times a day (BID) | ORAL | 1 refills | Status: DC
Start: 2020-03-30 — End: 2020-04-10

## 2020-03-30 NOTE — Patient Instructions (Signed)
It was nice to see you again today,  I have increased your metoprolol to 100 mg twice a day because your heart rate and blood pressure was still elevated.  You can take 2 of your 50 mg tablets twice a day until you get your next prescription.  At that time the your next prescription should be 100 mg tablets.  When you follow-up with Dr. Owens Shark on the 12th, she can adjust your medication further if necessary.  Have a great day,  Clemetine Marker, MD

## 2020-03-30 NOTE — Assessment & Plan Note (Signed)
Discussed smoking cessation with the patient.  He is not interested in using nicotine replacement or medications at this time.  Slowly tapering down on his own.  Encourage patient that ideally he would quit smoking entirely, but the less cigarettes he smokes the better for his heart.

## 2020-03-30 NOTE — Assessment & Plan Note (Signed)
Taking his Lipitor.  30-day supply was $14 at [facility].  Discussed alternative pharmacies with the patient, but cheapest would be $9 at [facility] and this would not make much of a difference when cost of transportation doctor then.  Patient will continue getting medication at [facility].

## 2020-03-30 NOTE — Progress Notes (Signed)
° ° °  SUBJECTIVE:   CHIEF COMPLAINT / HPI:   Atrial flutter: Patient has seen the cardiologist and has chosen to go through with the ablation.  He has a follow-up with the cardiologist coming up within the next month.  The cardiologist gave him some paper which you turned into community health and wellness that will help him afford his Xarelto.  He is able to afford his metoprolol from [facility]..    Smoking cessation: Patient states he has cut back on his smoking.  He now smokes 1 pack every 3 days.  He had a cigarette on the way over here and thinks this contributed to his elevated heart rate.  He says he is trying to quit but thinks it is unlikely that he will ever quit them 100%.  He does not want to quit with the help of patches or medications.  He is slowly tapering down on his own and he has done this before.  Cholesterol: Patient states the Lipitor at [facility] was $14 and he was almost unable to afford it, but someone behind him in line offered to pay for his medication.  He is concerned about being able to pay for this medication in the future.   PERTINENT  PMH / PSH: Atrial flutter, tobacco use  OBJECTIVE:   BP (!) 150/90    Pulse (!) 126    Ht 5\' 10"  (1.778 m)    Wt 183 lb 8 oz (83.2 kg)    SpO2 100%    BMI 26.33 kg/m   General: Alert and oriented.  No acute distress CV: Irregularly irregular rhythm, tachycardic rate.  No murmurs Pulmonary: Lungs clear to auscultation bilaterally   ASSESSMENT/PLAN:   Typical atrial flutter ([place]) Patient endorses compliance with metoprolol.  Willing to proceed with ablation going forward.  Has medication assistance for Xarelto and is now able to afford it.  On exam today he was tachycardic and irregularly irregular.  Blood pressure was also slightly elevated at 150/90.  Increase patient's metoprolol to 100 mg twice daily.  Can titrate further at next visit with Dr. Owens Shark on the 12th.  TOBACCO USER Discussed smoking cessation with the patient.  He  is not interested in using nicotine replacement or medications at this time.  Slowly tapering down on his own.  Encourage patient that ideally he would quit smoking entirely, but the less cigarettes he smokes the better for his heart.  Pure hypercholesterolemia Taking his Lipitor.  30-day supply was $14 at [facility].  Discussed alternative pharmacies with the patient, but cheapest would be $9 at [facility] and this would not make much of a difference when cost of transportation doctor then.  Patient will continue getting medication at [facility].     Benay Pike, MD [place]

## 2020-03-30 NOTE — Assessment & Plan Note (Signed)
Patient endorses compliance with metoprolol.  Willing to proceed with ablation going forward.  Has medication assistance for Xarelto and is now able to afford it.  On exam today he was tachycardic and irregularly irregular.  Blood pressure was also slightly elevated at 150/90.  Increase patient's metoprolol to 100 mg twice daily.  Can titrate further at next visit with Dr. Owens Shark on the 12th.

## 2020-03-31 LAB — COMPREHENSIVE METABOLIC PANEL
ALT: 31 IU/L (ref 0–44)
AST: 50 IU/L — ABNORMAL HIGH (ref 0–40)
Albumin/Globulin Ratio: 1.4 (ref 1.2–2.2)
Albumin: 4 g/dL (ref 3.8–4.9)
Alkaline Phosphatase: 144 IU/L — ABNORMAL HIGH (ref 44–121)
BUN/Creatinine Ratio: 11 (ref 9–20)
BUN: 12 mg/dL (ref 6–24)
Bilirubin Total: 1 mg/dL (ref 0.0–1.2)
CO2: 23 mmol/L (ref 20–29)
Calcium: 9.8 mg/dL (ref 8.7–10.2)
Chloride: 107 mmol/L — ABNORMAL HIGH (ref 96–106)
Creatinine, Ser: 1.08 mg/dL (ref 0.76–1.27)
GFR calc Af Amer: 91 mL/min/{1.73_m2} (ref >59)
GFR calc non Af Amer: 78 mL/min/{1.73_m2} (ref >59)
Globulin, Total: 2.9 g/dL (ref 1.5–4.5)
Glucose: 82 mg/dL (ref 65–99)
Potassium: 4.6 mmol/L (ref 3.5–5.2)
Sodium: 142 mmol/L (ref 134–144)
Total Protein: 6.9 g/dL (ref 6.0–8.5)

## 2020-04-10 ENCOUNTER — Other Ambulatory Visit: Payer: Self-pay | Admitting: Family Medicine

## 2020-04-10 ENCOUNTER — Other Ambulatory Visit: Payer: Self-pay

## 2020-04-10 ENCOUNTER — Encounter: Payer: Self-pay | Admitting: Family Medicine

## 2020-04-10 ENCOUNTER — Ambulatory Visit (INDEPENDENT_AMBULATORY_CARE_PROVIDER_SITE_OTHER): Payer: Self-pay | Admitting: Family Medicine

## 2020-04-10 VITALS — BP 120/80 | HR 83 | Wt 190.6 lb

## 2020-04-10 DIAGNOSIS — I10 Essential (primary) hypertension: Secondary | ICD-10-CM

## 2020-04-10 DIAGNOSIS — R7401 Elevation of levels of liver transaminase levels: Secondary | ICD-10-CM

## 2020-04-10 DIAGNOSIS — Z1211 Encounter for screening for malignant neoplasm of colon: Secondary | ICD-10-CM

## 2020-04-10 DIAGNOSIS — G4700 Insomnia, unspecified: Secondary | ICD-10-CM | POA: Insufficient documentation

## 2020-04-10 DIAGNOSIS — F17200 Nicotine dependence, unspecified, uncomplicated: Secondary | ICD-10-CM

## 2020-04-10 DIAGNOSIS — Z1159 Encounter for screening for other viral diseases: Secondary | ICD-10-CM

## 2020-04-10 DIAGNOSIS — I483 Typical atrial flutter: Secondary | ICD-10-CM

## 2020-04-10 DIAGNOSIS — Z1212 Encounter for screening for malignant neoplasm of rectum: Secondary | ICD-10-CM

## 2020-04-10 MED ORDER — RIVAROXABAN 20 MG PO TABS
20.0000 mg | ORAL_TABLET | Freq: Every day | ORAL | 3 refills | Status: DC
Start: 2020-04-10 — End: 2020-05-01

## 2020-04-10 MED ORDER — METOPROLOL TARTRATE 100 MG PO TABS
100.0000 mg | ORAL_TABLET | Freq: Two times a day (BID) | ORAL | 3 refills | Status: DC
Start: 2020-04-10 — End: 2021-04-29

## 2020-04-10 MED ORDER — ATORVASTATIN CALCIUM 40 MG PO TABS
40.0000 mg | ORAL_TABLET | Freq: Every day | ORAL | 3 refills | Status: DC
Start: 2020-04-10 — End: 2021-04-29

## 2020-04-10 MED FILL — XARELTO 20 MG TABLET: 20 | 30 days supply | Qty: 30 | Fill #0

## 2020-04-10 NOTE — Progress Notes (Signed)
SUBJECTIVE:   CHIEF COMPLAINT / HPI:   Kevin Mora is a very pleasant 54 year old gentleman with history significant for atrial flutter, tobacco abuse, hypertension and dyslipidemia presenting today for follow-up.  The patient reports he has no specific concerns today other than ongoing nocturnal what he describes as panic symptoms.  The patient reports that he has had difficulty in affording his Xarelto and requests this be sent to community health and wellness.  He reports during the day he feels fine.  He lays down to go to bed in the middle the night sometimes wakes up feeling his heart racing.  He has no associated chest pain but does have some dyspnea.  This is not happened for the past few nights.  He reports he thinks this is due to panic symptoms.  This resolves after a few minutes once he sits up on the side of the bed.  He denies dyspnea or chest pain during the day.  He reports his heart rate will become elevated if he walks too rapidly with his atrial flutter.  He is very much looking forward to his potential ablation that is upcoming.  The patient reports compliance with his metoprolol as well as Xarelto.  He is nearly out of his Xarelto and has had trouble affording this in the past.  He denies any specific symptoms today does report.  In terms of triggers he does report that he drinks several 40s on the weekends.  He continues to smoke.  He is not sure he is quite ready to quit yet.  The patient reports intermittent abdominal upset.  He reports over the last 5 to 10 years he has noted worsening constipation at times.  He takes magnesium citrate about once a week.  He denies melena or hematochezia.  He endorses some upper intestinal sensation of what he describes as burning.  He denies nausea vomiting or evidence of GI bleeding including melena hematochezia as above.  PERTINENT  PMH / PSH/Family/Social History : Reviewed and updated.  He has never had a colonoscopy.  He does have  a 30-pack-year history and would meet criteria for low-dose CT OBJECTIVE:   BP 120/80   Pulse 83   Wt 190 lb 9.6 oz (86.5 kg)   SpO2 94%   BMI 27.35 kg/m   Today's weight:  Last Weight  Most recent update: 04/10/2020 10:30 AM   Weight  86.5 kg (190 lb 9.6 oz)           Review of prior weights: Filed Weights   04/10/20 1030  Weight: 190 lb 9.6 oz (86.5 kg)     Cardiac: irregular rate and rhythm. Normal S1/S2. No murmurs, rubs, or gallops appreciated. Lungs: Clear bilaterally to ascultation.  Abdomen: Normoactive bowel sounds. No tenderness to deep or light palpation. No rebound or guarding.  No masses Psych: Pleasant and appropriate    Echo 10/21 1. Left ventricular ejection fraction, by estimation, is 60 to 65%. The  left ventricle has normal function. The left ventricle has no regional  wall motion abnormalities. Left ventricular diastolic function could not  be evaluated due to underlying atrial  flutter.  2. Right ventricular systolic function is normal. The right ventricular  size is mildly enlarged. Tricuspid regurgitation signal is inadequate for  assessing PA pressure.  3. The mitral valve is normal in structure. Moderate mitral valve  regurgitation. No evidence of mitral stenosis. Regurgitant volume 28cc, MR  ERO 0.22cm2  4. The aortic valve is normal  in structure. Aortic valve regurgitation is  not visualized. No aortic stenosis is present.  5. The inferior vena cava is dilated in size with >50% respiratory  variability, suggesting right atrial pressure of 8 mmHg.  ASSESSMENT/PLAN:   Essential hypertension Markedly improved today on increased dose of beta-blocker.  He denies symptoms of blood pressure.  Continue current medication.  TOBACCO USER Discussed today still precontemplative.  He would like to consider discontinuing in the future.  He has cut down over the past few months.  We discussed that this may be contributing to a number of his  symptoms.  Typical atrial flutter (HCC) Heart rate is improved today he has follow-up later this month with ultra physiology for ablation.  I suspect this may be in part may be contributing to his nocturnal symptoms.  Frequent nocturnal awakening The patient reports these are due to panic symptoms but I suspect in part these are due to his underlying atrial flutter in the evening hours. Also considered PND but most recent EF improved, euvolemic today. In terms of panic symptoms,   His mood appears to be doing well.  He reports he does not have anxiety otherwise.  He reports no new stressors.  He does use excess alcohol at times and this may also be contributing to uncontrolled rates in the middle of the night. If not improved at follow up, consider sleep study and additional evaluation for GAD.    Intermittent Constipation Along with upper abdominal pain concern for gastritis, will evaluate with colonoscopy he is due for screening colonoscopy.  If this is not improved at follow-up consider EGD given his history of alcohol use as well as Xarelto use.  HCM Continue to discuss tobacco cessation Low dose CT discussion at follow up TDAP at follow up CSY ordered   Dorris Singh, [place]

## 2020-04-10 NOTE — Assessment & Plan Note (Signed)
The patient reports these are due to panic symptoms but I suspect in part these are due to his underlying atrial flutter in the evening hours. Also considered PND but most recent EF improved, euvolemic today. In terms of panic symptoms,   His mood appears to be doing well.  He reports he does not have anxiety otherwise.  He reports no new stressors.  He does use excess alcohol at times and this may also be contributing to uncontrolled rates in the middle of the night.

## 2020-04-10 NOTE — Assessment & Plan Note (Signed)
Discussed today still precontemplative.  He would like to consider discontinuing in the future.  He has cut down over the past few months.  We discussed that this may be contributing to a number of his symptoms.

## 2020-04-10 NOTE — Assessment & Plan Note (Signed)
Markedly improved today on increased dose of beta-blocker.  He denies symptoms of blood pressure.  Continue current medication.

## 2020-04-10 NOTE — Assessment & Plan Note (Signed)
Heart rate is improved today he has follow-up later this month with ultra physiology for ablation.  I suspect this may be in part may be contributing to his nocturnal symptoms.

## 2020-04-10 NOTE — Patient Instructions (Signed)
It was wonderful to see you today.  Please bring ALL of your medications with you to every visit.   Today we talked about:   - Going to the lab- I will call you with results  - Follow up 4-6 weeks for stomach burning  - Getting a colonoscopy   --- If your panic symptoms get worse, please give me a call   Thank you for choosing [facility].   Please call [phone] with any questions about today's appointment.  Please be sure to schedule follow up at the front  desk before you leave today.   Dorris Singh, MD  Family Medicine

## 2020-04-15 LAB — HCV AB W REFLEX TO QUANT PCR: HCV Ab: <0.1 s/co ratio (ref 0.0–0.9)

## 2020-04-15 LAB — HEPATIC FUNCTION PANEL
ALT: 25 IU/L (ref 0–44)
AST: 41 IU/L — ABNORMAL HIGH (ref 0–40)
Albumin: 4.2 g/dL (ref 3.8–4.9)
Alkaline Phosphatase: 105 IU/L (ref 44–121)
Bilirubin Total: 0.6 mg/dL (ref 0.0–1.2)
Bilirubin, Direct: 0.23 mg/dL (ref 0.00–0.40)
Total Protein: 6.9 g/dL (ref 6.0–8.5)

## 2020-04-15 LAB — HCV INTERPRETATION

## 2020-04-16 ENCOUNTER — Encounter: Payer: Self-pay | Admitting: Cardiology

## 2020-04-16 ENCOUNTER — Other Ambulatory Visit: Payer: Self-pay

## 2020-04-16 ENCOUNTER — Ambulatory Visit (INDEPENDENT_AMBULATORY_CARE_PROVIDER_SITE_OTHER): Payer: Self-pay | Admitting: Cardiology

## 2020-04-16 ENCOUNTER — Telehealth: Payer: Self-pay | Admitting: Family Medicine

## 2020-04-16 VITALS — BP 178/120 | HR 93 | Ht 70.0 in | Wt 184.0 lb

## 2020-04-16 DIAGNOSIS — I483 Typical atrial flutter: Secondary | ICD-10-CM

## 2020-04-16 NOTE — Progress Notes (Signed)
Electrophysiology Office Note   Date:  04/16/2020   ID:  Kevin Mora, DOB Aug 13, 1965, MRN [id number]  PCP:  Martyn Malay, MD  Cardiologist:   Primary Electrophysiologist:  Shronda Boeh Meredith Leeds, MD    Chief Complaint: Atrial flutter   History of Present Illness: Kevin Mora is a 54 y.o. male who is being seen today for the evaluation of atrial flutter at the request of Martyn Malay, MD. Presenting today for electrophysiology evaluation.  He has a history of chronic systolic heart failure, atrial flutter, hypertension, and tobacco abuse.  He was diagnosed with atrial flutter in 2019 but was lost to follow-up.  He presented to his primary physician's office 03/19/2020 with high heart rates and high blood pressure as well as crushing pain in his chest.  He went to urgent care was found to be in rapid atrial flutter.  He was started on metoprolol for rate control.  Today, he denies symptoms of palpitations, chest pain, shortness of breath, orthopnea, PND, lower extremity edema, claudication, dizziness, presyncope, syncope, bleeding, or neurologic sequela. The patient is tolerating medications without difficulties.  He is feeling better since starting his metoprolol.  His dose has been increased to 100 mg twice daily.  He does continue to have chest discomfort at times, but he feels that these are related to his rapid heart rates.   Past Medical History:  Diagnosis Date  . Atrial flutter ([place]) 12/2017  . Tobacco abuse    Past Surgical History:  Procedure Laterality Date  . APPENDECTOMY    . LIPOMA EXCISION     right shoulder  . WISDOM TOOTH EXTRACTION       Current Outpatient Medications  Medication Sig Dispense Refill  . atorvastatin (LIPITOR) 40 MG tablet Take 1 tablet (40 mg total) by mouth daily. 90 tablet 3  . metoprolol tartrate (LOPRESSOR) 100 MG tablet Take 1 tablet (100 mg total) by mouth 2 (two) times daily. 180 tablet 3  . rivaroxaban (XARELTO) 20 MG TABS  tablet Take 1 tablet (20 mg total) by mouth daily with supper. 90 tablet 3   No current facility-administered medications for this visit.    Allergies:   Patient has no known allergies.   Social History:  The patient  reports that he has been smoking cigarettes. He has a 15.00 pack-year smoking history. He has never used smokeless tobacco. He reports current alcohol use. He reports current drug use. Drug: Marijuana.   Family History:  The patient's family history includes Coronary artery disease in his father; Diabetes in his mother; Hypertension in his mother.    ROS:  Please see the history of present illness.   Otherwise, review of systems is positive for none.   All other systems are reviewed and negative.    PHYSICAL EXAM: VS:  BP (!) 178/120   Pulse 93   Ht 5\' 10"  (1.778 m)   Wt 184 lb (83.5 kg)   SpO2 98%   BMI 26.40 kg/m  , BMI Body mass index is 26.4 kg/m. GEN: Well nourished, well developed, in no acute distress  HEENT: normal  Neck: no JVD, carotid bruits, or masses Cardiac: irregular; no murmurs, rubs, or gallops,no edema  Respiratory:  clear to auscultation bilaterally, normal work of breathing GI: soft, nontender, nondistended, + BS MS: no deformity or atrophy  Skin: warm and dry Neuro:  Strength and sensation are intact Psych: euthymic mood, full affect  EKG:  EKG is ordered today. Personal review of the ekg  ordered shows atrial flutter rate 93  Recent Labs: 03/19/2020: B Natriuretic Peptide 172.0 03/20/2020: Magnesium 1.8; TSH 1.782 03/21/2020: Hemoglobin 16.4; Platelets 168 03/30/2020: BUN 12; Creatinine, Ser 1.08; Potassium 4.6; Sodium 142 04/10/2020: ALT 25    Lipid Panel     Component Value Date/Time   CHOL 230 (H) 03/20/2020 0025   TRIG 77 03/20/2020 0025   HDL 53 03/20/2020 0025   CHOLHDL 4.3 03/20/2020 0025   VLDL 15 03/20/2020 0025   LDLCALC 162 (H) 03/20/2020 0025     Wt Readings from Last 3 Encounters:  04/16/20 184 lb (83.5 kg)    04/10/20 190 lb 9.6 oz (86.5 kg)  03/30/20 183 lb 8 oz (83.2 kg)      Other studies Reviewed: Additional studies/ records that were reviewed today include: TTE 03/20/20  Review of the above records today demonstrates:  1. Left ventricular ejection fraction, by estimation, is 60 to 65%. The  left ventricle has normal function. The left ventricle has no regional  wall motion abnormalities. Left ventricular diastolic function could not  be evaluated due to underlying atrial  flutter.  2. Right ventricular systolic function is normal. The right ventricular  size is mildly enlarged. Tricuspid regurgitation signal is inadequate for  assessing PA pressure.  3. The mitral valve is normal in structure. Moderate mitral valve  regurgitation. No evidence of mitral stenosis. Regurgitant volume 28cc, MR  ERO 0.22cm2  4. The aortic valve is normal in structure. Aortic valve regurgitation is  not visualized. No aortic stenosis is present.  5. The inferior vena cava is dilated in size with >50% respiratory  variability, suggesting right atrial pressure of 8 mmHg.   ASSESSMENT AND PLAN:  1.  Typical atrial flutter: Currently on Xarelto and metoprolol.  CHA2DS2-VASc of 2.  He would prefer ablation versus other management.  Risks and benefits of ablation were discussed include bleeding, tamponade, heart block, stroke.  The patient understands these risks and has agreed to the procedure.  2.  Hypertension: Significantly elevated today but he has had more recent blood pressure checks which have been normal.  He Kamil Mchaffie check his blood pressures at home and call us back with readings.  3.  Chronic systolic heart failure: Ejection fraction is fortunately recovered.  4.  Chest pain: Pain is somewhat atypical for coronary disease.  It is certainly possible that it is due to his rapid atrial flutter.  Despite that, I do feel that he needs an ischemic evaluation.  We Ashlin Hidalgo get a Myoview once his insurance  has been approved.  Current medicines are reviewed at length with the patient today.   The patient does not have concerns regarding his medicines.  The following changes were made today:  none  Labs/ tests ordered today include:  Orders Placed This Encounter  Procedures  . EKG 12-Lead     Disposition:   FU with Leala Bryand 3 months  Signed, Seira Cody Meredith Leeds, MD  04/16/2020 3:29 PM     [facility] [address] [place] [address] [phone] (office) [phone] (fax)

## 2020-04-16 NOTE — Patient Instructions (Addendum)
Medication Instructions:  Your physician recommends that you continue on your current medications as directed. Please refer to the Current Medication list given to you today.  *If you need a refill on your cardiac medications before your next appointment, please call your pharmacy*   Lab Work: None ordered   Testing/Procedures: None ordered   Follow-Up: At [facility], you and your health needs are our priority.  As part of our continuing mission to provide you with exceptional heart care, we have created designated Provider Care Teams.  These Care Teams include your primary Cardiologist (physician) and Advanced Practice Providers (APPs -  Physician Assistants and Nurse Practitioners) who all work together to provide you with the care you need, when you need it.  We recommend signing up for the patient portal called "MyChart".  Sign up information is provided on this After Visit Summary.  MyChart is used to connect with patients for Virtual Visits (Telemedicine).  Patients are able to view lab/test results, encounter notes, upcoming appointments, etc.  Non-urgent messages can be sent to your provider as well.   To learn more about what you can do with MyChart, go to [email or web address].    Your next appointment:    to be determined  The format for your next appointment:   In Person  Provider:   Allegra Lai, MD    Thank you for choosing [facility]!!   Trinidad Curet, RN [phone]   Other Instructions  Check you blood pressure several times daily for the next week.  The nurse will follow up with you next week to further evaluate/discuss.  Once you get Orange card/Medicaid the nurse will call you to arrange a myoview test and ablation.

## 2020-04-16 NOTE — Telephone Encounter (Signed)
Called with results. Repeat CMP at follow up. Dorris Singh, MD  Family Medicine Teaching Service

## 2020-04-21 ENCOUNTER — Telehealth: Payer: Self-pay

## 2020-04-21 NOTE — Telephone Encounter (Signed)
FAXED APPLICATION TO [employer] FOR PATIENT ASSISTANCE FOR [place].

## 2020-04-27 ENCOUNTER — Telehealth: Payer: Self-pay | Admitting: Cardiology

## 2020-04-27 NOTE — Telephone Encounter (Signed)
New Message  Pt is calling and says he was given paperwork for orange card to be filled out. He says he has been trying to reach [facility] [phone] for a week and has not gotten a response, he says the phone only rings  He is wondering if Venida Jarvis can help him reach her   Please call

## 2020-04-28 ENCOUNTER — Telehealth: Payer: Self-pay | Admitting: Licensed Clinical Social Worker

## 2020-04-28 NOTE — Telephone Encounter (Signed)
Apologized for misinformation given. Aware that there is an application he will need to complete -- pt will stop by our office tomorrow to pick up.  [facility] financial counseling number given to pt [phone]. Pt appreciates the return call and help.

## 2020-04-28 NOTE — Telephone Encounter (Signed)
CSW received referral to assist patient with information on insurance options. CSW attempted to reach patient with no answer and no ability to leave a message. CSW will attempt again tomorrow. Raquel Sarna, [place], [place]

## 2020-04-29 ENCOUNTER — Telehealth: Payer: Self-pay | Admitting: Family Medicine

## 2020-04-29 NOTE — Telephone Encounter (Signed)
Patient dropped off form needs letter supporting him trying to get help with his meds from [place] and [place].  He was seen on 04/10/20. Put in red folder.

## 2020-04-30 NOTE — Telephone Encounter (Signed)
Placed in MDs box to be filled out. Lavora Brisbon, CMA  

## 2020-05-01 MED ORDER — RIVAROXABAN 20 MG PO TABS
20.0000 mg | ORAL_TABLET | Freq: Every day | ORAL | 3 refills | Status: DC
Start: 2020-05-01 — End: 2021-04-15

## 2020-05-01 NOTE — Telephone Encounter (Signed)
Dicussed with PharmD.  This is to be faxed with packet, placed in [facility] box.  Kelly--let me know if issues.  Thanks, Dorris Singh, MD  [facility] Medicine Teaching Service

## 2020-05-04 ENCOUNTER — Telehealth: Payer: Self-pay | Admitting: Cardiology

## 2020-05-04 ENCOUNTER — Ambulatory Visit (INDEPENDENT_AMBULATORY_CARE_PROVIDER_SITE_OTHER): Payer: Self-pay | Admitting: Family Medicine

## 2020-05-04 ENCOUNTER — Telehealth: Payer: Self-pay

## 2020-05-04 ENCOUNTER — Other Ambulatory Visit: Payer: Self-pay

## 2020-05-04 VITALS — BP 152/98 | HR 80 | Wt 186.0 lb

## 2020-05-04 DIAGNOSIS — K625 Hemorrhage of anus and rectum: Secondary | ICD-10-CM | POA: Insufficient documentation

## 2020-05-04 DIAGNOSIS — K921 Melena: Secondary | ICD-10-CM

## 2020-05-04 LAB — HEMOCCULT GUIAC POC 1CARD (OFFICE): Fecal Occult Blood, POC: NEGATIVE

## 2020-05-04 LAB — POCT HEMOGLOBIN: Hemoglobin: 15.6 g/dL — AB (ref 11–14.6)

## 2020-05-04 NOTE — Assessment & Plan Note (Signed)
Most likely secondary to internal hemorrhoids based on painless nature and low quantity of bright red in stool.  The differential also includes diverticulosis although this is less likely due to quantity of blood might also be associated with GI malignancy.  I have a lower suspicion for diverticulitis or other infectious etiology at this time.  Low suspicion for upper GI bleed based on bright red blood and no evidence of melena. -Hold Xarelto until bleeding is stopped for at least 2 days.  Call me if your bleeding persists for more than 1 week. -Colonoscopy ordered -Kevin Mora is in the process of signing up for an orange card

## 2020-05-04 NOTE — Telephone Encounter (Signed)
Agree with below. Addendum:    I believe that she has limits with moving and including, toileting, bathing, feeding, dressing and grooming. I believe the power wheelchair is needed for pt to be able to perform ADL's in her home

## 2020-05-04 NOTE — Telephone Encounter (Signed)
Patient calls nurse line regarding blood in stool. Patient reports normal bowel movement with blood in stool and on tissue.   Denies dizziness, shortness of breath and abdominal pain.   Scheduled patient OV this afternoon for evaluation. Strict ED precautions given in the meantime.   FYI to PCP  Talbot Grumbling, RN

## 2020-05-04 NOTE — Patient Instructions (Signed)
Lower GI bleed: I think that your rectal bleeding is most likely caused by hemorrhoids.  I was not able to find any hemorrhoids on exam today.  I think it is 100% necessary to get a colonoscopy to make sure there is no dangerous source of bleeding.  You should get a call in the next 1-2 weeks to set up an appointment for a colonoscopy.  I recommend that you stop taking your Xarelto until your bleeding has stopped for at least 2 days.  Please give me a call if you still have bleeding after 1 week.  Continue taking your other medications.

## 2020-05-04 NOTE — Telephone Encounter (Signed)
   Pt c/o medication issue:  1. Name of Medication:   rivaroxaban (XARELTO) 20 MG TABS tablet    2. How are you currently taking this medication (dosage and times per day)? Take 1 tablet (20 mg total) by mouth daily with supper.  3. Are you having a reaction (difficulty breathing--STAT)?   4. What is your medication issue? Pt is calling he said he's been having blood in his stool, he wanted to know if he needs to go to the ED or need to stop taking his blood thinner

## 2020-05-04 NOTE — Progress Notes (Signed)
    SUBJECTIVE:   CHIEF COMPLAINT / HPI:   Bright red blood per rectum Kevin Mora reports that he has been having bright red blood per rectum for the past day.  He first noticed that he started having small amount of blood on the toilet tissue and mixed in with his stool.  He specifically denies black stools or sticky stools.  He noted a little bit more bleeding with a bowel movement this morning.  He has very mild lower abdominal pain that he associates with his bowel movements.  He does not associate any abdominal pain with eating.  He denies any history of hemorrhoids.  PERTINENT  PMH / PSH: A. fib on anticoagulation with Xarelto  OBJECTIVE:   BP (!) 152/98   Pulse 80   Wt 186 lb (84.4 kg)   SpO2 98%   BMI 26.69 kg/m    General: Alert and cooperative and appears to be in no acute distress Cardio: Normal S1 and S2, no S3 or S4. Rhythm is regular. No murmurs or rubs.   Pulm: Clear to auscultation bilaterally, no crackles, wheezing, or diminished breath sounds. Normal respiratory effort Abdomen: Bowel sounds normal. Abdomen soft and non-tender. Rectal exam: DRE attempted but was discontinued due to patient discomfort.  No significant external hemorrhoids appreciated.  Small stool sample was obtained. Extremities: No peripheral edema. Warm/ well perfused.  Strong radial pulse.   ASSESSMENT/PLAN:   Bright red blood per rectum Most likely secondary to internal hemorrhoids based on painless nature and low quantity of bright red in stool.  The differential also includes diverticulosis although this is less likely due to quantity of blood might also be associated with GI malignancy.  I have a lower suspicion for diverticulitis or other infectious etiology at this time.  Low suspicion for upper GI bleed based on bright red blood and no evidence of melena. -Hold Xarelto until bleeding is stopped for at least 2 days.  Call me if your bleeding persists for more than 1 week. -Colonoscopy  ordered -Kevin Mora is in the process of signing up for an orange card     Matilde Haymaker, MD [place] [facility]

## 2020-05-04 NOTE — Telephone Encounter (Signed)
Returned call to patient who states he is having bleeding from rectum following a bowel movement. He states it was not significant bleeding but enough blood to go through the toilet paper. He is scheduled to see his PCP today at 2:30. I advised that I will forward message to Dr. Curt Bears and his nurse for awareness. Patient thanked me for the call.

## 2020-05-19 ENCOUNTER — Encounter: Payer: Self-pay | Admitting: Physician Assistant

## 2020-05-19 MED FILL — XARELTO 20 MG TABLET: 20 | 30 days supply | Qty: 30 | Fill #1

## 2020-05-19 NOTE — Telephone Encounter (Signed)
Enrollment approved until 05/19/21, patient aware.  [employer] billing information; BIN: [id number] ID: [id number] GRP: [id number] for $0 copay.  Pt states he will continue to pick up the Xarelto from [place] at this time.

## 2020-05-20 ENCOUNTER — Telehealth: Payer: Self-pay | Admitting: Cardiology

## 2020-05-20 NOTE — Telephone Encounter (Signed)
Pt given [facility] office to call to address if application received/status. Patient verbalized understanding and agreeable to plan.

## 2020-05-20 NOTE — Telephone Encounter (Signed)
    Pt would like to speak with Kevin Mora, he said it's about his paperwork would like to get an update

## 2020-06-01 MED FILL — XARELTO 20 MG TABLET: 20 | 30 days supply | Qty: 30 | Fill #1

## 2020-06-10 ENCOUNTER — Ambulatory Visit: Payer: Self-pay | Admitting: Physician Assistant

## 2020-06-24 ENCOUNTER — Ambulatory Visit: Payer: Self-pay | Admitting: Physician Assistant

## 2020-07-30 MED FILL — XARELTO 20 MG TABLET: 20 | 30 days supply | Qty: 30 | Fill #2

## 2020-08-29 ENCOUNTER — Other Ambulatory Visit: Payer: Self-pay

## 2020-09-07 ENCOUNTER — Other Ambulatory Visit: Payer: Self-pay

## 2020-09-07 MED FILL — Rivaroxaban Tab 20 MG: ORAL | 90 days supply | Qty: 90 | Fill #0 | Status: AC

## 2020-09-10 ENCOUNTER — Other Ambulatory Visit: Payer: Self-pay

## 2020-10-25 IMAGING — DX DG CHEST 2V
2 series · 2 of 2 positions shown · non-contrast
Comparison: 01/04/2018

CLINICAL DATA: Hypertension

EXAM:
CHEST - 2 VIEW

[chest pa]
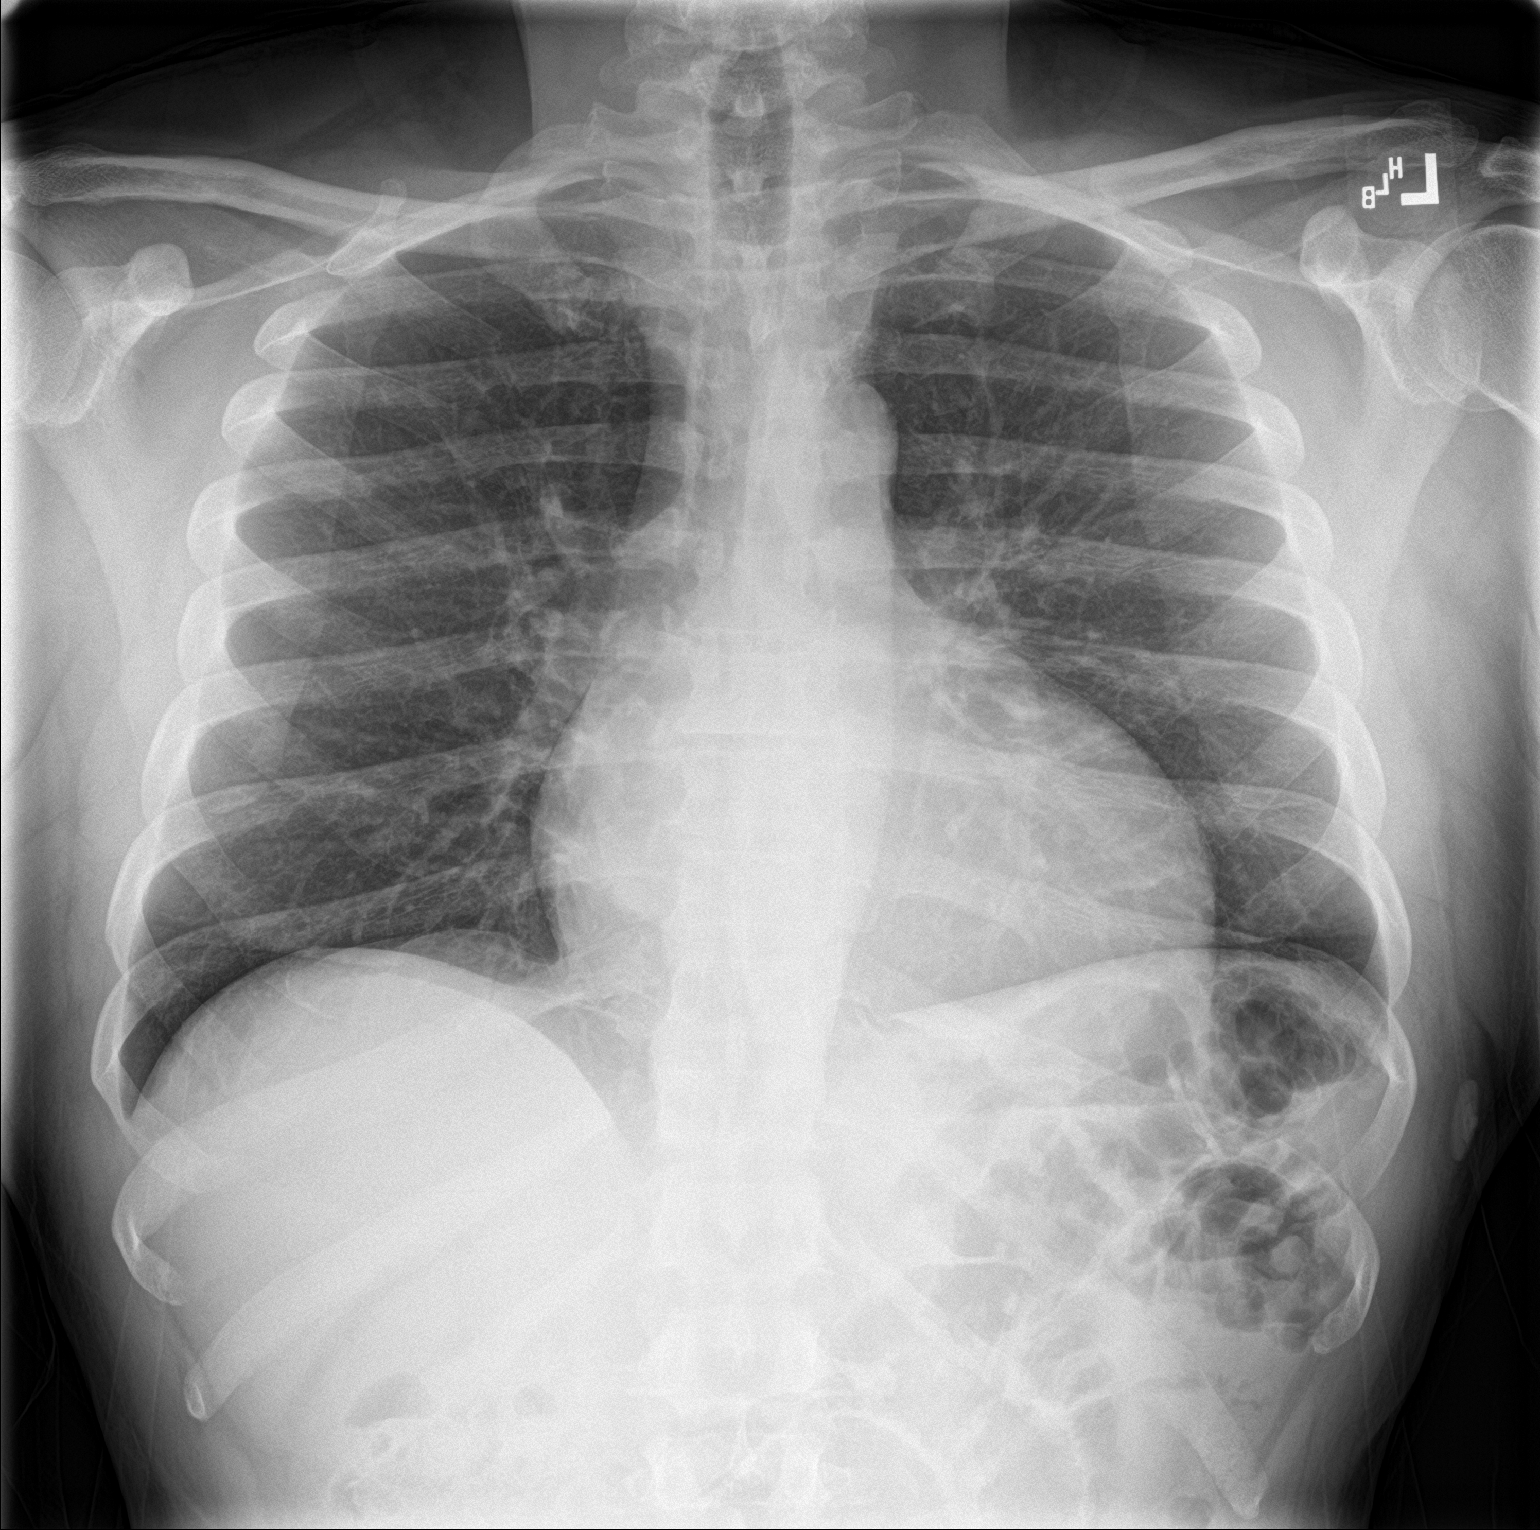

[chest lat]
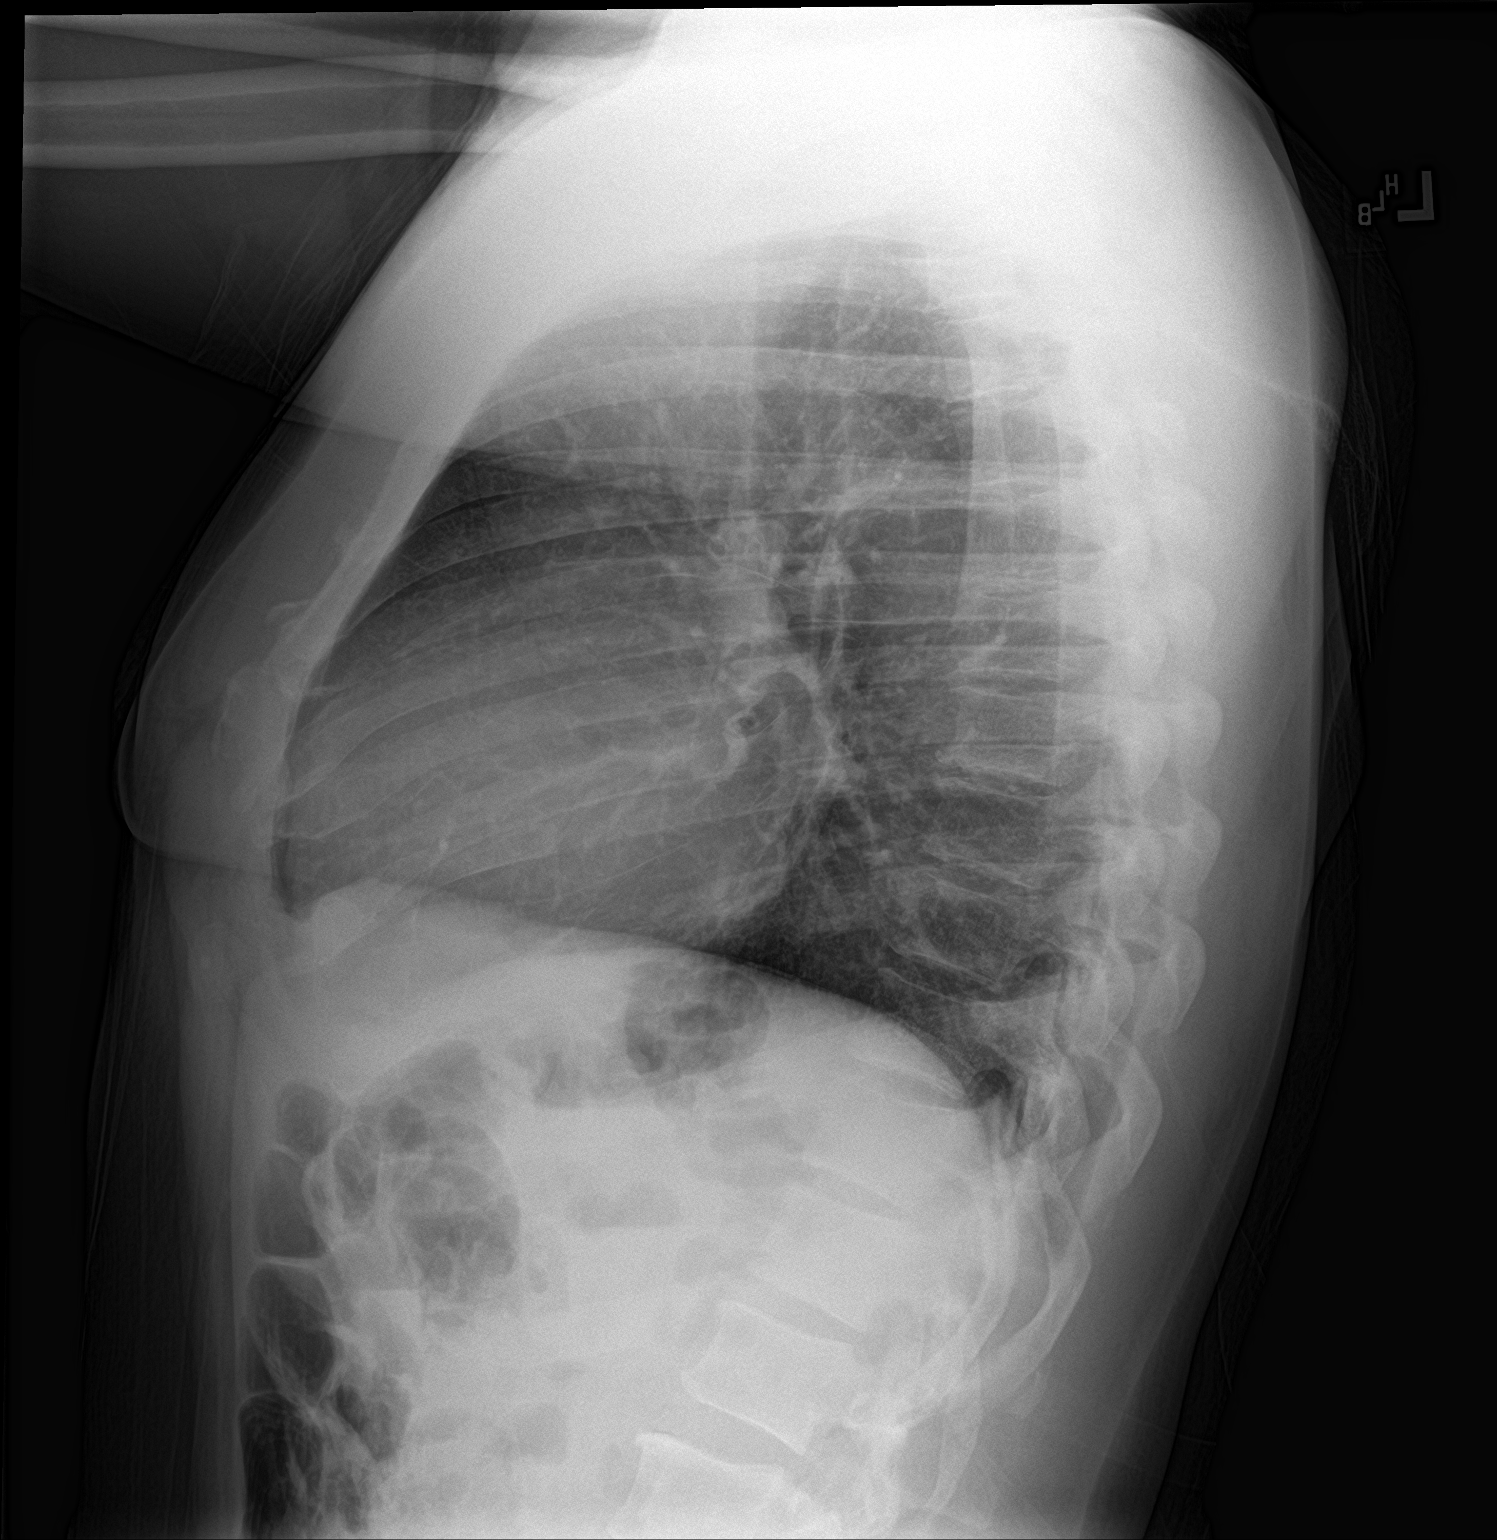

[2 of 2 positions shown; findings below may reference images not displayed]

FINDINGS: The heart size is upper limits of normal, unchanged. Both lungs are
clear. The visualized skeletal structures are unremarkable.
IMPRESSION: No active cardiopulmonary disease.

## 2020-12-15 ENCOUNTER — Other Ambulatory Visit: Payer: Self-pay

## 2020-12-15 MED FILL — Rivaroxaban Tab 20 MG: ORAL | 90 days supply | Qty: 90 | Fill #1 | Status: AC

## 2021-01-01 ENCOUNTER — Encounter (HOSPITAL_COMMUNITY): Payer: Self-pay | Admitting: *Deleted

## 2021-01-01 ENCOUNTER — Other Ambulatory Visit: Payer: Self-pay

## 2021-01-01 ENCOUNTER — Ambulatory Visit (HOSPITAL_COMMUNITY)
Admission: EM | Admit: 2021-01-01 | Discharge: 2021-01-01 | Disposition: A | Discharge status: Home / Self Care | Payer: Self-pay | Attending: Medical Oncology | Admitting: Medical Oncology

## 2021-01-01 DIAGNOSIS — K047 Periapical abscess without sinus: Secondary | ICD-10-CM

## 2021-01-01 MED ORDER — AMOXICILLIN-POT CLAVULANATE 875-125 MG PO TABS: 1.0000 | ORAL_TABLET | Freq: Two times a day (BID) | ORAL | 0 refills | Status: DC

## 2021-01-01 MED ORDER — ACETAMINOPHEN-CODEINE #3 300-30 MG PO TABS
1.0000 | ORAL_TABLET | Freq: Four times a day (QID) | ORAL | 0 refills | Status: AC | PRN
Start: 2021-01-01 — End: 2021-01-06

## 2021-01-01 NOTE — ED Triage Notes (Signed)
Pt reports Rt upper tooth abscess.

## 2021-01-01 NOTE — ED Provider Notes (Signed)
[facility]    CSN: [id number] Arrival date & time: 01/01/21  1804      History   Chief Complaint Chief Complaint  Patient presents with   Abscess    HPI Kevin Mora is a 55 y.o. male.   HPI  Dental Pain: Patient reports that for the past 2 days he has had right upper dental pain.  He states that he knows that the tooth is decayed and he has an appointment for the end of the month to see a dentist.  He has had severe pain since.  He has been taking regular Tylenol as directed on the bottle but this has not helped with his pain so he has been not sleeping well.  He states that he has been told not to take NSAIDs with his medications.  He denies any fevers, trouble breathing or trouble swallowing.  Past Medical History:  Diagnosis Date   Atrial flutter ([place]) 12/2017   Tobacco abuse     Patient Active Problem List   Diagnosis Date Noted   Bright red blood per rectum 05/04/2020   Frequent nocturnal awakening 04/10/2020   Secondary hypercoagulable state ([place]) 03/26/2020   Left ventricular systolic dysfunction, NYHA class 1 01/30/2018   Chronic low back pain 01/23/2018   Typical atrial flutter ([place]) 01/09/2018   Essential hypertension 08/07/2009   TOBACCO USER 03/07/2009    Past Surgical History:  Procedure Laterality Date   APPENDECTOMY     LIPOMA EXCISION     right shoulder   WISDOM TOOTH EXTRACTION         Home Medications    Prior to Admission medications   Medication Sig Start Date End Date Taking? Authorizing Provider  atorvastatin (LIPITOR) 40 MG tablet Take 1 tablet (40 mg total) by mouth daily. 04/10/20  Yes Martyn Malay, MD  metoprolol tartrate (LOPRESSOR) 100 MG tablet Take 1 tablet (100 mg total) by mouth 2 (two) times daily. 04/10/20  Yes Martyn Malay, MD  rivaroxaban (XARELTO) 20 MG TABS tablet Take 1 tablet (20 mg total) by mouth daily with supper. 05/01/20  Yes Martyn Malay, MD  rivaroxaban (XARELTO) 20 MG TABS tablet TAKE 1  TABLET (20 MG TOTAL) BY MOUTH DAILY WITH SUPPER. 04/10/20 04/10/21  Martyn Malay, MD  diltiazem (CARDIZEM CD) 120 MG 24 hr capsule Take 1 capsule (120 mg total) by mouth daily. Patient not taking: Reported on 11/15/2018 02/19/18 11/15/18  Martyn Malay, MD    Family History Family History  Problem Relation Age of Onset   Diabetes Mother    Hypertension Mother    Coronary artery disease Father     Social History Social History   Tobacco Use   Smoking status: Every Day    Packs/day: 0.50    Years: 30.00    Pack years: 15.00    Types: Cigarettes   Smokeless tobacco: Never  Vaping Use   Vaping Use: Never used  Substance Use Topics   Alcohol use: Yes    Comment: Socially    Drug use: Yes    Types: Marijuana     Allergies   Patient has no known allergies.   Review of Systems Review of Systems  As stated above in HPI Physical Exam Triage Vital Signs ED Triage Vitals  Enc Vitals Group     BP 01/01/21 1832 (!) 169/117     Pulse Rate 01/01/21 1832 76     Resp 01/01/21 1832 18     Temp  01/01/21 1832 98.5 F (36.9 C)     Temp src --      SpO2 01/01/21 1832 100 %     Weight --      Height --      Head Circumference --      Peak Flow --      Pain Score 01/01/21 1829 8     Pain Loc --      Pain Edu? --      Excl. in [place]? --    No data found.  Updated Vital Signs BP (!) 169/117   Pulse 76   Temp 98.5 F (36.9 C)   Resp 18   SpO2 100%   Physical Exam Vitals and nursing note reviewed.  Constitutional:      Appearance: Normal appearance.  HENT:     Head: Normocephalic and atraumatic.     Nose: Nose normal. No congestion or rhinorrhea.     Mouth/Throat:     Mouth: Mucous membranes are moist.     Dentition: Dental tenderness, gingival swelling, dental abscesses and gum lesions present.     Tongue: No lesions. Tongue does not deviate from midline.     Palate: No mass and lesions.     Pharynx: Oropharynx is clear. No uvula swelling.     Tonsils: No  tonsillar exudate or tonsillar abscesses.      Comments: No bogginess, edema or tenderness of inferior palate  Eyes:     Extraocular Movements: Extraocular movements intact.     Pupils: Pupils are equal, round, and reactive to light.  Cardiovascular:     Rate and Rhythm: Normal rate and regular rhythm.     Heart sounds: Normal heart sounds.  Pulmonary:     Effort: Pulmonary effort is normal.     Breath sounds: Normal breath sounds.  Lymphadenopathy:     Cervical: Cervical adenopathy present.  Neurological:     Mental Status: He is alert.     UC Treatments / Results  Labs (all labs ordered are listed, but only abnormal results are displayed) Labs Reviewed - No data to display  EKG   Radiology No results found.  Procedures Procedures (including critical care time)  Medications Ordered in UC Medications - No data to display  Initial Impression / Assessment and Plan / UC Course  I have reviewed the triage vital signs and the nursing notes.  Pertinent labs & imaging results that were available during my care of the patient were reviewed by me and considered in my medical decision making (see chart for details).     New.  Treating with Augmentin to help prevent further systemic complications.  Discussed red flag signs and symptoms as well as common potential side effects and precautions.  In addition his over-the-counter medication is not helping with his pain and he has an obvious abscess of the tooth.  I am going to call in Tylenol 3 for him to use for pain relief.  We discussed how to safely use this medication as needed for severe pain.  Follow-up as needed.  Continue follow-up with his dentist. Final Clinical Impressions(s) / UC Diagnoses   Final diagnoses:  None   Discharge Instructions   None    ED Prescriptions   None    PDMP not reviewed this encounter.   Hughie Closs, [place] 01/01/21 1902

## 2021-03-15 ENCOUNTER — Other Ambulatory Visit: Payer: Self-pay

## 2021-03-15 MED FILL — Rivaroxaban Tab 20 MG: ORAL | 90 days supply | Qty: 90 | Fill #2 | Status: AC

## 2021-04-15 ENCOUNTER — Other Ambulatory Visit: Payer: Self-pay

## 2021-04-15 ENCOUNTER — Other Ambulatory Visit: Payer: Self-pay | Admitting: Family Medicine

## 2021-04-15 MED ORDER — RIVAROXABAN 20 MG PO TABS
20.0000 mg | ORAL_TABLET | Freq: Every day | ORAL | 1 refills | Status: DC
  Filled 2021-04-15 – 2021-10-05 (×5): qty 30, 30d supply, fill #0
  Filled 2021-11-02: qty 30, 30d supply, fill #1

## 2021-04-27 ENCOUNTER — Other Ambulatory Visit: Payer: Self-pay

## 2021-04-28 ENCOUNTER — Telehealth: Payer: Self-pay | Admitting: Family Medicine

## 2021-04-28 NOTE — Telephone Encounter (Signed)
Patient is dropping off application for patient assistance for his medication. He has completed his portion and would like for Dr. Owens Shark to complete provider section.   Please call patient when completed. [phone]

## 2021-04-29 ENCOUNTER — Other Ambulatory Visit: Payer: Self-pay

## 2021-04-29 MED ORDER — ATORVASTATIN CALCIUM 40 MG PO TABS
40.0000 mg | ORAL_TABLET | Freq: Every day | ORAL | 3 refills | Status: DC
Start: 2021-04-29 — End: 2022-03-22

## 2021-04-29 MED ORDER — METOPROLOL TARTRATE 100 MG PO TABS
100.0000 mg | ORAL_TABLET | Freq: Two times a day (BID) | ORAL | 3 refills | Status: DC
Start: 2021-04-29 — End: 2022-03-01

## 2021-04-29 NOTE — Telephone Encounter (Signed)
Reviewed form and placed in PCP's box for completion.  .German Manke R Shamaine Mulkern, CMA  

## 2021-04-29 NOTE — Telephone Encounter (Signed)
Form completed,routing to [facility] for final review.  Dorris Singh, MD  Family Medicine Teaching Service

## 2021-05-05 ENCOUNTER — Telehealth: Payer: Self-pay

## 2021-05-05 NOTE — Telephone Encounter (Signed)
ATTEMPTED TO CONTACT PT REGARDING PT ASSISTANCE APPLICATION FOR XARELTO ([place]).   WILL TRY AGAIN

## 2021-05-18 NOTE — Telephone Encounter (Signed)
ATTEMPTED 2ND PHONE CALL, NO RING "CALL CANNOT BE COMPLETED AT THIS TIME".

## 2021-06-11 ENCOUNTER — Other Ambulatory Visit: Payer: Self-pay

## 2021-06-15 ENCOUNTER — Other Ambulatory Visit: Payer: Self-pay

## 2021-06-15 ENCOUNTER — Telehealth: Payer: Self-pay | Admitting: Family Medicine

## 2021-06-15 DIAGNOSIS — I483 Typical atrial flutter: Secondary | ICD-10-CM

## 2021-06-15 MED ORDER — RIVAROXABAN 20 MG PO TABS: 20.0000 mg | ORAL_TABLET | Freq: Every day | ORAL | 0 refills | Status: DC

## 2021-06-15 NOTE — Telephone Encounter (Signed)
Called patient as to inquire about medication.  Patient's phone is off and he can be reached at [phone] for now.  Patient is currently out of his Xarelto.  Will forward message to [place] as she was trying to reach patient prior for missing paperwork to complete his medication assistance.  Ozella Almond, [place]

## 2021-06-15 NOTE — Telephone Encounter (Signed)
Patient came in stating that he went to pick up his prescription at [facility] and he had to pay for it this time. He says he usually gets it for free. He just wants to make sure the paperwork to update medical card for his Xarelto was sent in. He would like to get a call back if possible please. He can be reached at this number [phone].

## 2021-06-15 NOTE — Addendum Note (Signed)
Addended by: Owens Shark, Brewster Wolters on: 06/15/2021 03:00 PM   Modules accepted: Orders

## 2021-06-15 NOTE — Telephone Encounter (Addendum)
We have samples of his appropriate dose.   Attempted to call patient--reached patient at number below. Confirmed DOB. He will pick up Xeralto 20 mg dose. Sample is in medication room with patients name and DOB on the bag. He will pick up 06/16/2021.   Dorris Singh, MD  Family Medicine Teaching Service

## 2021-06-16 NOTE — Telephone Encounter (Signed)
Patient picked up medication sample and left a copy of income for [place].  Kevin Mora, [place]

## 2021-06-17 ENCOUNTER — Other Ambulatory Visit: Payer: Self-pay

## 2021-06-24 ENCOUNTER — Other Ambulatory Visit: Payer: Self-pay

## 2021-07-01 ENCOUNTER — Other Ambulatory Visit: Payer: Self-pay

## 2021-07-20 ENCOUNTER — Other Ambulatory Visit: Payer: Self-pay

## 2021-07-21 ENCOUNTER — Telehealth: Payer: Self-pay

## 2021-07-21 NOTE — Telephone Encounter (Signed)
Attempted to call pt but no answer or voicemail.   Patient assistance application (Kevin Mora & Kevin Mora) was submitted 07/08/21 however this was the old application that was mailed to pt. Pt will need to sign an updated application to re-enroll. I have app available to mail or come by office in person.

## 2021-08-22 ENCOUNTER — Encounter (HOSPITAL_COMMUNITY): Payer: Self-pay | Admitting: Emergency Medicine

## 2021-08-22 ENCOUNTER — Emergency Department (HOSPITAL_COMMUNITY)
Admission: EM | Admit: 2021-08-22 | Discharge: 2021-08-22 | Disposition: A | Discharge status: Home / Self Care | Payer: Self-pay | Attending: Emergency Medicine | Admitting: Emergency Medicine

## 2021-08-22 ENCOUNTER — Other Ambulatory Visit: Payer: Self-pay

## 2021-08-22 DIAGNOSIS — I4892 Unspecified atrial flutter: Secondary | ICD-10-CM | POA: Insufficient documentation

## 2021-08-22 DIAGNOSIS — Z79899 Other long term (current) drug therapy: Secondary | ICD-10-CM | POA: Insufficient documentation

## 2021-08-22 DIAGNOSIS — I10 Essential (primary) hypertension: Secondary | ICD-10-CM | POA: Insufficient documentation

## 2021-08-22 DIAGNOSIS — R21 Rash and other nonspecific skin eruption: Secondary | ICD-10-CM | POA: Insufficient documentation

## 2021-08-22 DIAGNOSIS — L539 Erythematous condition, unspecified: Secondary | ICD-10-CM | POA: Insufficient documentation

## 2021-08-22 DIAGNOSIS — M79673 Pain in unspecified foot: Secondary | ICD-10-CM | POA: Insufficient documentation

## 2021-08-22 DIAGNOSIS — Z7901 Long term (current) use of anticoagulants: Secondary | ICD-10-CM | POA: Insufficient documentation

## 2021-08-22 MED ORDER — METHYLPREDNISOLONE 4 MG PO TBPK: ORAL_TABLET | ORAL | 0 refills | Status: DC

## 2021-08-22 MED ORDER — DIPHENHYDRAMINE HCL 25 MG PO TABS: 25.0000 mg | ORAL_TABLET | Freq: Three times a day (TID) | ORAL | 0 refills | Status: DC | PRN

## 2021-08-22 NOTE — ED Notes (Signed)
RN discussed and educated pt on discharged medication. Pt expressed understanding.  ?

## 2021-08-22 NOTE — ED Triage Notes (Signed)
C/o rash to L side and bumps to bilateral axilla since Wednesday.  Pt believes it is shingles.  Also reports bilateral foot pain x 2 days.  Denies injury.  History of plantar fascitis and states it feels similar.   ?

## 2021-08-22 NOTE — ED Provider Notes (Signed)
?[facility] ?Provider Note ? ? ?CSN: [id number] ?Arrival date & time: 08/22/21  1131 ? ?  ? ?History ? ?Chief Complaint  ?Patient presents with  ? Rash  ? Foot Pain  ? ? ?Kevin Mora is a 56 y.o. male. ? ?HPI ? ?56 year old male with past medical history of HTN, atrial flutter presents emergency department concern for rash.  Patient states 3 days ago he developed an itchy rash underneath his right armpit.  He has been itching it and now the rash has spread to his left armpit and down both of his flanks and upper back.  He had assumed that the rash was shingles.  He has not been taking any over-the-counter medication.  He has not been using any creams.  Denies any history of allergic rashes or contact with anything that could have caused an allergic reaction.  He denies any open wounds/systemic symptoms. ? ?Home Medications ?Prior to Admission medications   ?Medication Sig Start Date End Date Taking? Authorizing Provider  ?diphenhydrAMINE (BENADRYL) 25 MG tablet Take 1 tablet (25 mg total) by mouth every 8 (eight) hours as needed. 08/22/21  Yes Ethell Blatchford, Alvin Critchley, DO  ?methylPREDNISolone (MEDROL DOSEPAK) 4 MG TBPK tablet Take as directed 08/22/21  Yes Nisreen Guise, Alvin Critchley, DO  ?amoxicillin-clavulanate (AUGMENTIN) 875-125 MG tablet Take 1 tablet by mouth every 12 (twelve) hours. 01/01/21   Hughie Closs, PA-C  ?atorvastatin (LIPITOR) 40 MG tablet Take 1 tablet (40 mg total) by mouth daily. 04/29/21   Martyn Malay, MD  ?metoprolol tartrate (LOPRESSOR) 100 MG tablet Take 1 tablet (100 mg total) by mouth 2 (two) times daily. 04/29/21   Martyn Malay, MD  ?rivaroxaban (XARELTO) 20 MG TABS tablet Take 1 tablet (20 mg total) by mouth daily with supper. SCHEDULE VISIT BEFORE NEXT REFILL 04/15/21   Martyn Malay, MD  ?rivaroxaban (XARELTO) 20 MG TABS tablet Take 1 tablet (20 mg total) by mouth daily with supper. 06/15/21   Martyn Malay, MD  ?diltiazem (CARDIZEM CD) 120 MG 24 hr  capsule Take 1 capsule (120 mg total) by mouth daily. ?Patient not taking: Reported on 11/15/2018 02/19/18 11/15/18  Martyn Malay, MD  ?   ? ?Allergies    ?Patient has no known allergies.   ? ?Review of Systems   ?Review of Systems  ?Constitutional:  Negative for fever.  ?HENT:  Negative for facial swelling.   ?Respiratory:  Negative for shortness of breath.   ?Cardiovascular:  Negative for chest pain.  ?Skin:  Positive for rash.  ? ?Physical Exam ?Updated Vital Signs ?BP (!) 161/114   Pulse 83   Temp 98.1 ?F (36.7 ?C) (Oral)   Resp 16   SpO2 97%  ?Physical Exam ?Vitals and nursing note reviewed.  ?Constitutional:   ?   Appearance: Normal appearance.  ?HENT:  ?   Head: Normocephalic.  ?   Mouth/Throat:  ?   Mouth: Mucous membranes are moist.  ?Cardiovascular:  ?   Rate and Rhythm: Normal rate.  ?Pulmonary:  ?   Effort: Pulmonary effort is normal. No respiratory distress.  ?Skin: ?   Comments: Patient has an urticarial rash just around his bilateral axilla, spreading to his bilateral flanks and upper back.  No open wounds but there is excoriated skin along this rash.  No vesicles or findings of shingles  ?Neurological:  ?   Mental Status: He is alert and oriented to person, place, and time. Mental status is at baseline.  ? ? ?  ED Results / Procedures / Treatments   ?Labs ?(all labs ordered are listed, but only abnormal results are displayed) ?Labs Reviewed - No data to display ? ?EKG ?None ? ?Radiology ?No results found. ? ?Procedures ?Procedures  ? ? ?Medications Ordered in ED ?Medications - No data to display ? ?ED Course/ Medical Decision Making/ A&P ?  ?                        ?Medical Decision Making ?Risk ?OTC drugs. ?Prescription drug management. ? ? ?56 year old male presents emergency department with a rash has been going on for the past 3 to 4 days.  His primary concern is that it is shingles.  The rash is currently bilateral axillas, flank and upper back.  It is urticarial, itchy, erythematous without  signs of cellulitis.  There is scattered excoriations but no vesicles or findings of shingles. ? ?We will treat as urticarial/allergic rash.  No other signs of allergic reaction/anaphylaxis.  Patient was hypertensive on arrival, did not take his morning medications, offered a dose of medications here which she declined.  He states he will go home and take his medications.  Patient at this time appears safe and stable for discharge and close outpatient follow up. Discharge plan and strict return to ED precautions discussed, patient verbalizes understanding and agreement. ? ? ? ? ? ? ? ?Final Clinical Impression(s) / ED Diagnoses ?Final diagnoses:  ?Rash  ? ? ?Rx / DC Orders ?ED Discharge Orders   ? ?      Ordered  ?  diphenhydrAMINE (BENADRYL) 25 MG tablet  Every 8 hours PRN       ? 08/22/21 1512  ?  methylPREDNISolone (MEDROL DOSEPAK) 4 MG TBPK tablet       ? 08/22/21 1512  ? ?  ?  ? ?  ? ? ?  ?Lorelle Gibbs, DO ?08/22/21 1521 ? ?

## 2021-08-22 NOTE — Discharge Instructions (Addendum)
You have been seen and discharged from the emergency department. You have what appears to be an urticarial rash. Treatment for this is antihistamine and steroids. Follow-up with your primary provider for further evaluation and further care. Take home medications as prescribed. If you have any worsening symptoms or further concerns for your health please return to an emergency department for further evaluation. ?

## 2021-10-05 ENCOUNTER — Other Ambulatory Visit: Payer: Self-pay

## 2021-11-02 ENCOUNTER — Other Ambulatory Visit: Payer: Self-pay

## 2021-11-03 ENCOUNTER — Other Ambulatory Visit: Payer: Self-pay

## 2021-11-04 ENCOUNTER — Other Ambulatory Visit: Payer: Self-pay

## 2021-11-19 ENCOUNTER — Ambulatory Visit: Payer: Self-pay | Admitting: Family Medicine

## 2021-12-03 ENCOUNTER — Encounter: Payer: Self-pay | Admitting: Family Medicine

## 2021-12-04 NOTE — Progress Notes (Signed)
Late cancel

## 2021-12-20 ENCOUNTER — Other Ambulatory Visit: Payer: Self-pay

## 2022-03-01 ENCOUNTER — Other Ambulatory Visit (HOSPITAL_BASED_OUTPATIENT_CLINIC_OR_DEPARTMENT_OTHER): Payer: Self-pay

## 2022-03-01 ENCOUNTER — Other Ambulatory Visit: Payer: Self-pay

## 2022-03-01 MED ORDER — METOPROLOL TARTRATE 100 MG PO TABS: 100.0000 mg | ORAL_TABLET | Freq: Two times a day (BID) | ORAL | 1 refills | Status: DC

## 2022-03-01 MED ORDER — RIVAROXABAN 20 MG PO TABS
20.0000 mg | ORAL_TABLET | Freq: Every day | ORAL | 1 refills | Status: DC
  Filled 2022-03-01 – 2022-03-02 (×2): qty 30, 30d supply, fill #0

## 2022-03-02 ENCOUNTER — Other Ambulatory Visit (HOSPITAL_BASED_OUTPATIENT_CLINIC_OR_DEPARTMENT_OTHER): Payer: Self-pay

## 2022-03-02 ENCOUNTER — Other Ambulatory Visit: Payer: Self-pay

## 2022-03-04 ENCOUNTER — Other Ambulatory Visit: Payer: Self-pay

## 2022-03-07 ENCOUNTER — Other Ambulatory Visit (HOSPITAL_COMMUNITY): Payer: Self-pay

## 2022-03-08 ENCOUNTER — Ambulatory Visit (INDEPENDENT_AMBULATORY_CARE_PROVIDER_SITE_OTHER): Payer: Self-pay | Admitting: Family Medicine

## 2022-03-08 ENCOUNTER — Other Ambulatory Visit (HOSPITAL_COMMUNITY): Payer: Self-pay

## 2022-03-08 ENCOUNTER — Encounter: Payer: Self-pay | Admitting: Family Medicine

## 2022-03-08 VITALS — BP 124/86 | HR 60 | Ht 70.0 in | Wt 209.6 lb

## 2022-03-08 DIAGNOSIS — F17200 Nicotine dependence, unspecified, uncomplicated: Secondary | ICD-10-CM

## 2022-03-08 DIAGNOSIS — K625 Hemorrhage of anus and rectum: Secondary | ICD-10-CM

## 2022-03-08 DIAGNOSIS — I483 Typical atrial flutter: Secondary | ICD-10-CM

## 2022-03-08 DIAGNOSIS — Z72820 Sleep deprivation: Secondary | ICD-10-CM

## 2022-03-08 DIAGNOSIS — R202 Paresthesia of skin: Secondary | ICD-10-CM

## 2022-03-08 DIAGNOSIS — I10 Essential (primary) hypertension: Secondary | ICD-10-CM

## 2022-03-08 DIAGNOSIS — Z23 Encounter for immunization: Secondary | ICD-10-CM

## 2022-03-08 MED ORDER — TETANUS-DIPHTH-ACELL PERTUSSIS 5-2.5-18.5 LF-MCG/0.5 IM SUSY: 0.5000 mL | PREFILLED_SYRINGE | Freq: Once | INTRAMUSCULAR | 0 refills | Status: AC

## 2022-03-08 MED ORDER — SHINGRIX 50 MCG/0.5ML IM SUSR: 0.5000 mL | INTRAMUSCULAR | 0 refills | Status: DC

## 2022-03-08 NOTE — Patient Instructions (Signed)
It was wonderful to see you today.  Please bring ALL of your medications with you to every visit.   Today we talked about:  --I have referred you to A Sleep Test for your sleep and Cardiology for you heart. If you do not received a phone call about this appointment within 2 weeks, please call our office back at [phone]. Jazmin Hartsell coordinates our referrals and can assist you in this.   I will call you with blood work  At your next visit we will discuss your feet more and screening for lung cancer   Tobacco use is damaging to your body. It increases your risk of stroke, heart attack, lung cancer, and serious lung disease in the future. It also reduces your fertility.   Quitting tobacco is the best thing for your health but is a challenge---nicotine, a chemical in cigarettes, is highly addictive.   You can call 1 800 QUIT NOW ([phone])011-0377)---you will be connected with a Artist. They can also mail you nicotine gums, lozenges, and patches to quit.   Ask me about patches (which you wear all day) and gums (which you use when you have a craving) to help you quit.   There are safe, effective medications to help you quit--  Varencline---also called Chantix---- is the most common medication used to help people stop smoking. It starts a low dose and is increased. I recommended choosing a quit date then starting the medication 8 days before this. Side effects include mild headache, difficulty sleeping, and odd dreams. The medication is typically very well tolerated.     Bupropion---also called Zyban---- is started 1 week before your quit date. You take 1 pill for three days then increase to 1 pill twice per day. Side effects include a mild headache and anxiety---this usually goes away. Some patients experience weight loss.      Please follow up in 1 months   Thank you for choosing [facility].   Please call [phone] with any questions about today's  appointment.  Please be sure to schedule follow up at the front  desk before you leave today.   Dorris Singh, MD  Family Medicine

## 2022-03-08 NOTE — Assessment & Plan Note (Signed)
Has had no further episodes he reports. Discussed importance of CSY. New referral placed.

## 2022-03-08 NOTE — Progress Notes (Signed)
    SUBJECTIVE:   CHIEF COMPLAINT: follow up  HPI:   Kevin Mora is a 56 y.o.  with history notable for atrial flutter on Xeralto, HTN, tobacco use, HFpEF  presenting for follow up.  He has several concerns today.   He reports his sleep is poor. He reports dyspnea when he lies flat, particularly after a large meal. He is not sure if he snores. He endorses dyspnea with 1 flight of stairs. This is not new--ongoing a year or more. Denies chest pain, PND, LE edema. He reports compliance with metoprolol and Xeralto.   He reports a 1 year or more history LE pain with walking. He can no longer walk to the bus stop due to calf and feet pain. Describes this as numbness and paresthesias. Denies back pain. No improvement with leaning forward. No LE ulcers.   The patient reports he has been living in [place] taking care of his step mom. He lives with family members. He is cutting back on smoking, not interested in medication for this.   PERTINENT  PMH / PSH/Family/Social History : updated and reviewed  OBJECTIVE:   BP 124/86   Pulse 60   Ht '5\' 10"'$  (1.778 m)   Wt 209 lb 9.6 oz (95.1 kg)   SpO2 96%   BMI 30.07 kg/m   Today's weight:  Last Weight  Most recent update: 03/08/2022  9:54 AM    Weight  95.1 kg (209 lb 9.6 oz)            Review of prior weights: Filed Weights   03/08/22 0953  Weight: 209 lb 9.6 oz (95.1 kg)     Cardiac: Irregular  rate and rhythm. Normal S1/S2. No murmurs, rubs, or gallops appreciated. Lungs: Clear bilaterally to ascultation.  Abdomen: Normoactive bowel sounds. No tenderness to deep or light palpation. No rebound or guarding.  No masses   Psych: Pleasant and appropriate    ASSESSMENT/PLAN:   Bright red blood per rectum Has had no further episodes he reports. Discussed importance of CSY. New referral placed.   Typical atrial flutter (HCC) CBC and CMP today. Continue Xeralto and Metoprolol  TOBACCO USER Discussed importance of cessation     Lower Extremity Pain Ddx includes diabetes, B12 deficiency, PAD, spinal stenosis History most concerning for PAD Will obtain labs today Pending results, will need to see vascular  Lipid panel today  Poor Sleep Suspect combination of heart failure (appears relatively euvolemic) and OSA Referral to sleep study Echocardiogram   Dyspnea on Exertion DDX includes anemia, CAD (high risk given history) CBC today Lipid panel Echocardiogram ordered Referral to Cardiology Discussed return precautions  HCM Rx Tdap and Shingrix today--discussed importance  Flu     Dorris Singh, MD  [facility]

## 2022-03-08 NOTE — Assessment & Plan Note (Signed)
CBC and CMP today. Continue Xeralto and Metoprolol

## 2022-03-08 NOTE — Assessment & Plan Note (Signed)
Discussed importance of cessation

## 2022-03-09 ENCOUNTER — Encounter: Payer: Self-pay | Admitting: Family Medicine

## 2022-03-09 ENCOUNTER — Telehealth: Payer: Self-pay | Admitting: Family Medicine

## 2022-03-09 LAB — COMPREHENSIVE METABOLIC PANEL
ALT: 9 IU/L (ref 0–44)
AST: 19 IU/L (ref 0–40)
Albumin/Globulin Ratio: 1.4 (ref 1.2–2.2)
Albumin: 3.9 g/dL (ref 3.8–4.9)
Alkaline Phosphatase: 95 IU/L (ref 44–121)
BUN/Creatinine Ratio: 13 (ref 9–20)
BUN: 13 mg/dL (ref 6–24)
Bilirubin Total: 0.4 mg/dL (ref 0.0–1.2)
CO2: 22 mmol/L (ref 20–29)
Calcium: 10 mg/dL (ref 8.7–10.2)
Chloride: 105 mmol/L (ref 96–106)
Creatinine, Ser: 0.97 mg/dL (ref 0.76–1.27)
Globulin, Total: 2.7 g/dL (ref 1.5–4.5)
Glucose: 98 mg/dL (ref 70–99)
Potassium: 4.2 mmol/L (ref 3.5–5.2)
Sodium: 144 mmol/L (ref 134–144)
Total Protein: 6.6 g/dL (ref 6.0–8.5)
eGFR: 93 mL/min/{1.73_m2} (ref >59)

## 2022-03-09 LAB — LIPID PANEL
Chol/HDL Ratio: 5.6 ratio — ABNORMAL HIGH (ref 0.0–5.0)
Cholesterol, Total: 220 mg/dL — ABNORMAL HIGH (ref 100–199)
HDL: 39 mg/dL — ABNORMAL LOW (ref >39)
LDL Chol Calc (NIH): 153 mg/dL — ABNORMAL HIGH (ref 0–99)
Triglycerides: 154 mg/dL — ABNORMAL HIGH (ref 0–149)
VLDL Cholesterol Cal: 28 mg/dL (ref 5–40)

## 2022-03-09 LAB — CBC
Hematocrit: 43.6 % (ref 37.5–51.0)
Hemoglobin: 15 g/dL (ref 13.0–17.7)
MCH: 31.2 pg (ref 26.6–33.0)
MCHC: 34.4 g/dL (ref 31.5–35.7)
MCV: 91 fL (ref 79–97)
Platelets: 184 10*3/uL (ref 150–450)
RBC: 4.81 x10E6/uL (ref 4.14–5.80)
RDW: 13.5 % (ref 11.6–15.4)
WBC: 5.2 10*3/uL (ref 3.4–10.8)

## 2022-03-09 LAB — HEMOGLOBIN A1C
Est. average glucose Bld gHb Est-mCnc: 137 mg/dL
Hgb A1c MFr Bld: 6.4 % — ABNORMAL HIGH (ref 4.8–5.6)

## 2022-03-09 LAB — VITAMIN B12: Vitamin B-12: 528 pg/mL (ref 232–1245)

## 2022-03-09 NOTE — Telephone Encounter (Signed)
Attempted to call patient. Unable to leave VM.  Will send letter--needs to start a statin, has prediabetes.    Dorris Singh, MD  Family Medicine Teaching Service

## 2022-03-10 ENCOUNTER — Telehealth: Payer: Self-pay

## 2022-03-10 ENCOUNTER — Other Ambulatory Visit (HOSPITAL_COMMUNITY): Payer: Self-pay

## 2022-03-10 NOTE — Telephone Encounter (Signed)
A Prior Authorization was initiated & APPROVED for this patients XARELTO through CoverMyMeds.   APPROVED THRU 03/10/2023  COPAY $54  ADDED COPAY CARD:  BIN [id number] PCN PDMI GROUP Etheleen Sia ID [id number]  Key: [id number]

## 2022-03-16 ENCOUNTER — Other Ambulatory Visit (HOSPITAL_BASED_OUTPATIENT_CLINIC_OR_DEPARTMENT_OTHER): Payer: Self-pay

## 2022-03-22 ENCOUNTER — Encounter: Payer: Self-pay | Admitting: Nurse Practitioner

## 2022-03-22 MED ORDER — ATORVASTATIN CALCIUM 40 MG PO TABS: 40.0000 mg | ORAL_TABLET | Freq: Every day | ORAL | 3 refills | Status: DC

## 2022-03-22 NOTE — Telephone Encounter (Signed)
Rx to [facility].  Dorris Singh, MD  Family Medicine Teaching Service

## 2022-03-22 NOTE — Addendum Note (Signed)
Addended by: Owens Shark, Brennen Camper on: 03/22/2022 02:16 PM   Modules accepted: Orders

## 2022-03-22 NOTE — Telephone Encounter (Signed)
Patient calls nurse line in regards to receiving letter.   Patient reports he is amenable to starting a statin.   Please send to [facility].

## 2022-03-24 ENCOUNTER — Other Ambulatory Visit: Payer: Self-pay

## 2022-03-24 MED ORDER — RIVAROXABAN 20 MG PO TABS
20.0000 mg | ORAL_TABLET | Freq: Every day | ORAL | 1 refills | Status: DC
  Filled 2022-03-24: qty 30, 30d supply, fill #0

## 2022-03-24 NOTE — Telephone Encounter (Signed)
Patient calls nurse line regarding Xarelto refill. He asked that prescription be transferred to [facility].   Canceled prescription at [facility]. Resent to [facility].   Talbot Grumbling, RN

## 2022-03-30 ENCOUNTER — Other Ambulatory Visit: Payer: Self-pay

## 2022-03-30 ENCOUNTER — Other Ambulatory Visit (HOSPITAL_COMMUNITY): Payer: Self-pay

## 2022-04-01 ENCOUNTER — Other Ambulatory Visit: Payer: Self-pay

## 2022-04-01 MED ORDER — METOPROLOL TARTRATE 100 MG PO TABS: 100.0000 mg | ORAL_TABLET | Freq: Two times a day (BID) | ORAL | 3 refills | Status: DC

## 2022-04-08 ENCOUNTER — Ambulatory Visit: Payer: 59 | Admitting: Family Medicine

## 2022-04-08 NOTE — Patient Instructions (Incomplete)
It was wonderful to see you today.  Please bring ALL of your medications with you to every visit.   Today we talked about:  Please call to schedule your colonoscopy [facility] Address: [address], [place], [place] [address] Phone: [phone]  Please follow up in 3 months   Thank you for choosing [facility].   Please call [phone] with any questions about today's appointment.  Please be sure to schedule follow up at the front  desk before you leave today.   Dorris Singh, MD  Family Medicine

## 2022-04-26 ENCOUNTER — Encounter: Payer: Self-pay | Admitting: Family Medicine

## 2022-04-26 ENCOUNTER — Other Ambulatory Visit: Payer: Commercial Managed Care - HMO

## 2022-04-26 ENCOUNTER — Other Ambulatory Visit: Payer: Self-pay

## 2022-04-26 ENCOUNTER — Other Ambulatory Visit (HOSPITAL_COMMUNITY): Payer: Self-pay

## 2022-04-26 ENCOUNTER — Ambulatory Visit (INDEPENDENT_AMBULATORY_CARE_PROVIDER_SITE_OTHER): Payer: Commercial Managed Care - HMO | Admitting: Family Medicine

## 2022-04-26 VITALS — BP 142/70 | HR 108 | Temp 98.6°F | Ht 70.0 in | Wt 210.6 lb

## 2022-04-26 DIAGNOSIS — J189 Pneumonia, unspecified organism: Secondary | ICD-10-CM

## 2022-04-26 DIAGNOSIS — R058 Other specified cough: Secondary | ICD-10-CM | POA: Diagnosis not present

## 2022-04-26 DIAGNOSIS — E785 Hyperlipidemia, unspecified: Secondary | ICD-10-CM | POA: Diagnosis not present

## 2022-04-26 DIAGNOSIS — I483 Typical atrial flutter: Secondary | ICD-10-CM

## 2022-04-26 DIAGNOSIS — I739 Peripheral vascular disease, unspecified: Secondary | ICD-10-CM

## 2022-04-26 DIAGNOSIS — R0609 Other forms of dyspnea: Secondary | ICD-10-CM | POA: Diagnosis not present

## 2022-04-26 DIAGNOSIS — F17200 Nicotine dependence, unspecified, uncomplicated: Secondary | ICD-10-CM

## 2022-04-26 MED ORDER — NICOTINE 7 MG/24HR TD PT24
7.0000 mg | MEDICATED_PATCH | Freq: Every day | TRANSDERMAL | 3 refills | Status: AC
  Filled 2022-04-26 (×2): qty 28, 28d supply, fill #0

## 2022-04-26 MED ORDER — ATORVASTATIN CALCIUM 40 MG PO TABS
40.0000 mg | ORAL_TABLET | Freq: Every day | ORAL | 3 refills | Status: AC
  Filled 2022-04-26: qty 90, 90d supply, fill #0

## 2022-04-26 MED ORDER — NICOTINE 7 MG/24HR TD PT24: 7.0000 mg | MEDICATED_PATCH | Freq: Every day | TRANSDERMAL | 3 refills | Status: DC

## 2022-04-26 MED ORDER — DOXYCYCLINE HYCLATE 100 MG PO TABS
100.0000 mg | ORAL_TABLET | Freq: Two times a day (BID) | ORAL | 0 refills | Status: AC
  Filled 2022-04-26: qty 10, 5d supply, fill #0

## 2022-04-26 MED ORDER — CEFUROXIME AXETIL 500 MG PO TABS
500.0000 mg | ORAL_TABLET | Freq: Two times a day (BID) | ORAL | 0 refills | Status: AC
  Filled 2022-04-26 (×2): qty 10, 5d supply, fill #0

## 2022-04-26 MED ORDER — ALBUTEROL SULFATE HFA 108 (90 BASE) MCG/ACT IN AERS
2.0000 | INHALATION_SPRAY | Freq: Four times a day (QID) | RESPIRATORY_TRACT | 0 refills | Status: AC | PRN
  Filled 2022-04-26: qty 6.7, 25d supply, fill #0

## 2022-04-26 MED ORDER — METOPROLOL TARTRATE 100 MG PO TABS
100.0000 mg | ORAL_TABLET | Freq: Two times a day (BID) | ORAL | 3 refills | Status: DC
  Filled 2022-04-26: qty 60, 30d supply, fill #0

## 2022-04-26 MED ORDER — RIVAROXABAN 20 MG PO TABS
20.0000 mg | ORAL_TABLET | Freq: Every day | ORAL | 3 refills | Status: AC
  Filled 2022-04-26: qty 30, 30d supply, fill #0
  Filled 2022-06-13 – 2022-06-30 (×2): qty 30, 30d supply, fill #1

## 2022-04-26 NOTE — Progress Notes (Signed)
SUBJECTIVE:   CHIEF COMPLAINT: congestion  HPI:   Kevin Mora is a 56 y.o.  with history notable for atrial flutter, HTN, and tobacco use  presenting for follow-up and concerns for congestion and cough.  The patient reports about 3 ago he got his shingles and tetanus vaccine.  After that a few days later his whole family started to get sick.  He is not sure if it was the vaccines or another virus but he developed cough congestion body aches and intermittent fevers.  His fevers have resolved.  He is slowly improving.  He is eating and drinking well.  He denies nausea vomiting, chest pain, orthopnea, paroxysmal nocturnal dyspnea, lower extremity IMA or weight gain.  He reports compliance with his medications.  He is taking only over-the-counter Tylenol and some Mucinex.  He reports he feels congestion in his chest still and has a productive cough.  He had intermittently feels dyspneic on exertion.  He is a smoker.  Has never been diagnosed with COPD.  Has not ever used an inhaler.   The patient continues to have lower extremity pain when walking.  Today he walked several blocks to the bus stop.  It took him more than 30 minutes to walk less than 1/4 mile.  His pain starts after he starts walking begins in his feet moves up his calves and then to his anterior legs.  This resolves with rest.  He denies associated back pain.  No numbness or tingling.  No trauma to the area.  He is a long-term smoker.  The patient has reduced his cigarettes. Smoking 2-3 per day. Thinks he might quit around the new year. Interested in patches today.   The patient has not yet taken his beta blocker today. Denies palpitations or chest pain. Reports he will take it at home.   PERTINENT  PMH / PSH/Family/Social History : Atrial flutter on apixaban   OBJECTIVE:   BP (!) 142/70   Pulse (!) 108   Temp 98.6 F (37 C)   Ht '5\' 10"'$  (1.778 m)   Wt 210 lb 9.6 oz (95.5 kg)   SpO2 99%   BMI 30.22 kg/m   Today's  weight:  Last Weight  Most recent update: 04/26/2022 10:31 AM    Weight  95.5 kg (210 lb 9.6 oz)            Review of prior weights: Filed Weights   04/26/22 0942  Weight: 210 lb 9.6 oz (95.5 kg)   HR 98 on exam  Cardiac: Regular rate and rhythm. Normal S1/S2. No murmurs, rubs, or gallops appreciated. Lungs: Bilateral rhonchi throughout lungs without wheezing  Abdomen: Normoactive bowel sounds. No tenderness to deep or light palpation. No rebound or guarding.    Psych: Pleasant and appropriate     ASSESSMENT/PLAN:   Typical atrial flutter (HCC) Monitor--HR improved when on beta blocker, asymptomatic    TOBACCO USER Rx for patches (7 mg) to pharmacy  Discussed options for quitting   Suspected CAP DDX also include COPD exacerbation (though no prior diagnosis), less likely HF exacerbation - BNP, CBC, CMP today - For body aches, check CK (just started statin) - Rx 3rd generation cephalosporin and doxycycline - Rx albuterol - he will call on Friday to reports symptoms - Reviewed reasons to go to ED and return   Suspected PAD No back pain suggestive of spinal stenosis  Prior exam suggestive of PAD  Will send to vascular for consideration of ABI  Dyslipidemia Direct LDL today--taking a statin  HCM  UTD on Tdap and Shingles    Follow up in 1 month--consider spirometry      Dorris Singh, MD  [place]

## 2022-04-26 NOTE — Patient Instructions (Addendum)
It was wonderful to see you today.  Please bring ALL of your medications with you to every visit.   Today we talked about:   - Going to the lab to get blood work - I recommend getting a chest x-ray - Please call me Thursday or Friday to let me know how you are doing   [phone]   An x-ray was ordered for you---you do not need an appointment to have this completed.  I recommend going to [facility]   If the results are normal,I will send you a letter  I will call you with results if anything is abnormal   I sent in an antibiotic--TWO---take each twice a day  I sent in an inhaler for your breathing--you can take 1-2 puffs every 4-6 hours    I have referred you to Vascular Surgery  to further evaluate your concern. If you do not received a phone call about this appointment within 2 weeks, please call our office back at [phone]. Jazmin Hartsell coordinates our referrals and can assist you in this.    Please follow up in 1 week   Thank you for choosing [facility].   Please call [phone] with any questions about today's appointment.  Please be sure to schedule follow up at the front  desk before you leave today.   Dorris Singh, MD  Family Medicine

## 2022-04-26 NOTE — Assessment & Plan Note (Signed)
Rx for patches (7 mg) to pharmacy  Discussed options for quitting

## 2022-04-26 NOTE — Assessment & Plan Note (Signed)
Monitor--HR improved when on beta blocker, asymptomatic

## 2022-04-27 ENCOUNTER — Telehealth: Payer: Self-pay

## 2022-04-27 LAB — CBC
Hematocrit: 43.2 % (ref 37.5–51.0)
Hemoglobin: 14.3 g/dL (ref 13.0–17.7)
MCH: 30.1 pg (ref 26.6–33.0)
MCHC: 33.1 g/dL (ref 31.5–35.7)
MCV: 91 fL (ref 79–97)
Platelets: 155 10*3/uL (ref 150–450)
RBC: 4.75 x10E6/uL (ref 4.14–5.80)
RDW: 13.2 % (ref 11.6–15.4)
WBC: 5.6 10*3/uL (ref 3.4–10.8)

## 2022-04-27 LAB — BRAIN NATRIURETIC PEPTIDE: BNP: 352.7 pg/mL — ABNORMAL HIGH (ref 0.0–100.0)

## 2022-04-27 LAB — COMPREHENSIVE METABOLIC PANEL
ALT: 12 IU/L (ref 0–44)
AST: 22 IU/L (ref 0–40)
Albumin/Globulin Ratio: 1.6 (ref 1.2–2.2)
Albumin: 4.3 g/dL (ref 3.8–4.9)
Alkaline Phosphatase: 100 IU/L (ref 44–121)
BUN/Creatinine Ratio: 16 (ref 9–20)
BUN: 18 mg/dL (ref 6–24)
Bilirubin Total: 0.6 mg/dL (ref 0.0–1.2)
CO2: 22 mmol/L (ref 20–29)
Calcium: 9.9 mg/dL (ref 8.7–10.2)
Chloride: 104 mmol/L (ref 96–106)
Creatinine, Ser: 1.15 mg/dL (ref 0.76–1.27)
Globulin, Total: 2.7 g/dL (ref 1.5–4.5)
Glucose: 114 mg/dL — ABNORMAL HIGH (ref 70–99)
Potassium: 4.5 mmol/L (ref 3.5–5.2)
Sodium: 143 mmol/L (ref 134–144)
Total Protein: 7 g/dL (ref 6.0–8.5)
eGFR: 76 mL/min/{1.73_m2} (ref >59)

## 2022-04-27 LAB — LDL CHOLESTEROL, DIRECT: LDL Direct: 115 mg/dL — ABNORMAL HIGH (ref 0–99)

## 2022-04-27 LAB — CK: Total CK: 143 U/L (ref 41–331)

## 2022-04-27 NOTE — Telephone Encounter (Signed)
Attempted to contact patient for upcoming ECHO.  There was no answer and there was no ability to leave a message.  Will try again later.  Ozella Almond, [place]

## 2022-04-28 ENCOUNTER — Encounter: Payer: Self-pay | Admitting: Family Medicine

## 2022-04-28 ENCOUNTER — Ambulatory Visit: Payer: 59 | Admitting: Nurse Practitioner

## 2022-04-28 ENCOUNTER — Ambulatory Visit
Admission: RE | Admit: 2022-04-28 | Discharge: 2022-04-28 | Disposition: A | Discharge status: Home / Self Care | Payer: Commercial Managed Care - HMO | Source: Ambulatory Visit | Attending: Family Medicine | Admitting: Family Medicine

## 2022-04-28 ENCOUNTER — Telehealth: Payer: Self-pay

## 2022-04-28 DIAGNOSIS — R058 Other specified cough: Secondary | ICD-10-CM

## 2022-04-28 NOTE — Progress Notes (Signed)
Cardiology Office Note Date:  04/28/2022  Patient ID:  Kevin Mora, DOB 27-May-1966, MRN [id number] PCP:  Martyn Malay, MD  Electrophysiologist: Dr. Curt Bears    Chief Complaint: revisit at PMD recommendations  History of Present Illness: Kevin Mora is a 56 y.o. male with history of AFlutter, chronic CHF (systolic) w/recovered LVEF by last echo, HTN  He saw Dr. Curt Bears 04/16/20, planned for AFlutter ablation, BP was elevated, though better at home/other MD offices and advised to monitor at home.  This was his last visit with EP/cardiology, no ablation noted.  There are some encounter notes, perhaps not scheduled with perhaps insurance issues, though unclear.  TODAY He is doing OK. He did not follow up with ablation because he decided he did not want to have it done.  Says he does not like the idea of a procedure on his heart. He denies CP but does have cardiac awareness of irregularity in his heart beat, sometimes gets winded with heavier activities. When at rest or inactive, he is more aware of his arrhythmia. He will infrequently wake suddenly "like a panic attack" have to sit up and settle his breathing and HR settles as well. He suspects his heart is racing and waking him.  No near syncope or syncope. He has been on Xarelto over a year, maybe closer to 2 years and reports good compliance No bleeding or signs of bleeding     AFlutetr/AAD hx Diagnosed 2019 No AAD to date  Past Medical History:  Diagnosis Date   Atrial flutter ([place]) 12/2017   Tobacco abuse     Past Surgical History:  Procedure Laterality Date   APPENDECTOMY     LIPOMA EXCISION     right shoulder   WISDOM TOOTH EXTRACTION      Current Outpatient Medications  Medication Sig Dispense Refill   albuterol (VENTOLIN HFA) 108 (90 Base) MCG/ACT inhaler Inhale 2 puffs into the lungs every 6 (six) hours as needed for wheezing or shortness of breath. 6.7 g 0   atorvastatin (LIPITOR) 40 MG tablet  Take 1 tablet (40 mg total) by mouth daily. 90 tablet 3   cefUROXime (CEFTIN) 500 MG tablet Take 1 tablet (500 mg total) by mouth 2 (two) times daily with a meal. 10 tablet 0   doxycycline (VIBRA-TABS) 100 MG tablet Take 1 tablet (100 mg total) by mouth 2 (two) times daily. 10 tablet 0   metoprolol tartrate (LOPRESSOR) 100 MG tablet Take 1 tablet (100 mg total) by mouth 2 (two) times daily. 180 tablet 3   nicotine (NICODERM CQ) 7 mg/24hr patch Place 1 patch (7 mg total) onto the skin daily. 28 patch 3   rivaroxaban (XARELTO) 20 MG TABS tablet Take 1 tablet (20 mg total) by mouth daily with supper. 90 tablet 3   Zoster Vaccine Adjuvanted [facility]) injection Inject 0.5 mLs into the muscle every 2 (two) months for 2 doses. 1 mL 0   No current facility-administered medications for this visit.    Allergies:   Patient has no known allergies.   Social History:  The patient  reports that he has been smoking cigarettes. He has a 7.50 pack-year smoking history. He has never used smokeless tobacco. He reports current alcohol use. He reports current drug use. Drug: Marijuana.   Family History:  The patient's family history includes Coronary artery disease in his father; Diabetes in his mother; Hypertension in his mother.  ROS:  Please see the history of present illness.  All other systems are reviewed and otherwise negative.   PHYSICAL EXAM:  VS:  There were no vitals taken for this visit. BMI: There is no height or weight on file to calculate BMI. Well nourished, well developed, in no acute distress HEENT: normocephalic, atraumatic Neck: no JVD, carotid bruits or masses Cardiac:  irreg-irreg; no significant murmurs, no rubs, or gallops Lungs:  CTA b/l, no wheezing, rhonchi or rales Abd: soft, nontender MS: no deformity or atrophy Ext: no edema Skin: warm and dry, no rash Neuro:  No gross deficits appreciated Psych: euthymic mood, full affect   EKG:  Done today and reviewed by myself shows   AFlutter (typical) 102bpm  03/20/20: TTE  1. Left ventricular ejection fraction, by estimation, is 60 to 65%. The  left ventricle has normal function. The left ventricle has no regional  wall motion abnormalities. Left ventricular diastolic function could not  be evaluated due to underlying atrial   flutter.   2. Right ventricular systolic function is normal. The right ventricular  size is mildly enlarged. Tricuspid regurgitation signal is inadequate for  assessing PA pressure.   3. The mitral valve is normal in structure. Moderate mitral valve  regurgitation. No evidence of mitral stenosis. Regurgitant volume 28cc, MR  ERO 0.22cm2   4. The aortic valve is normal in structure. Aortic valve regurgitation is  not visualized. No aortic stenosis is present.   5. The inferior vena cava is dilated in size with >50% respiratory  variability, suggesting right atrial pressure of 8 mmHg.    01/30/2018: TTE Study Conclusions  - Left ventricle: The cavity size was normal. Wall thickness was    normal. Systolic function was mildly to moderately reduced. The    estimated ejection fraction was in the range of 40% to 45%. Mild    diffuse hypokinesis with no identifiable regional variations.  - Mitral valve: There was mild to moderate regurgitation directed    centrally.  - Left atrium: The atrium was mildly dilated.  - Right ventricle: The cavity size was moderately dilated. Systolic    function was mildly reduced.  - Right atrium: The atrium was mildly dilated.  - Pericardium, extracardiac: A trivial pericardial effusion was    identified.  Recent Labs: 04/26/2022: ALT 12; BNP 352.7; BUN 18; Creatinine, Ser 1.15; Hemoglobin 14.3; Platelets 155; Potassium 4.5; Sodium 143  03/08/2022: Chol/HDL Ratio 5.6; Cholesterol, Total 220; HDL 39; LDL Chol Calc (NIH) 153; Triglycerides 154 04/26/2022: LDL Direct 115   Estimated Creatinine Clearance: 85.2 mL/min (by C-G formula based on SCr of 1.15 mg/dL).    Wt Readings from Last 3 Encounters:  04/26/22 210 lb 9.6 oz (95.5 kg)  03/08/22 209 lb 9.6 oz (95.1 kg)  05/04/20 186 lb (84.4 kg)     Other studies reviewed: Additional studies/records reviewed today include: summarized above  ASSESSMENT AND PLAN:  AFlutter (typical) CHA2DS2Vasc is 2 (for HTN/HF), on Xarelto, appropriately dosed  Rate controlled + symptomatic Discussed rhythm control options He does not want to purse ablation or have his heart shocked. Discussed AAD as possibility, though suspcet that he has probably been in AFlutter for the last couple years amd likely would need a DCCV to gain SR, would not suspcet drug would convert him alon.  Will get a monitor to get an idea of rate/rhythm and wait on his echo to aid in AAD options  2. HTN A recheck is 148/88 He is asked to keep an eye on it at home His BP has  been better at other visits  3. Chronic CHF (systolic) Presumed NICM EF 40-45% >> 60-65% by his last echo No exam findings of volume OL ? PND Await his echo   Disposition: F/u with [place] in a couple months, sooner if needed  Current medicines are reviewed at length with the patient today.  The patient did not have any concerns regarding medicines.  Venetia Night, PA-C 04/28/2022 12:34 PM     [place] [address] [place] [address] [phone] (office)  [phone] (fax)

## 2022-04-28 NOTE — Telephone Encounter (Signed)
After several attempts to contact patient at [phone] in which patient never answered and the voicemail had not been set up; his mother Kathreen Devoid [phone] was called and a message was left for patient. Patient's mother is listed on Release of Information.  Echocardiogram 05-05-2022 Echo Lab at [facility] 1500 hrs  .Ozella Almond, CMA

## 2022-04-28 NOTE — Progress Notes (Deleted)
Chief Complaint:  referred by PCP for rectal bleeding and an EGD   Assessment & Plan    # Rectal bleeding. Hgb 14.3.  Await CXR, echo prior to scheduling procedures.   # Recent cough and congestion treated empirically for CAP. BNP elevated at 352, CXR is pending. Also, echocardiogram ordered in October but not yet done.   # Atrial flutter, maintained on beta blocker  # History of mild chronic systolic CHF. EF 40-45% on echo in 2021  HPI:    Kevin Mora is a 56 y.o. year old male new to the practice with a past medical history of chronic systolic heart failure, atrial flutter, HTN, tobacco abuse. X.  See PMH / [place] for additional history   He was seen by PCP on 11/28 with congestion and cough. Treated empirically for CAP. CXR apparently ordered but not done? CMP , CBC were normal. BNP elevated at 352    Previous Labs / Imaging::    Latest Ref Rng & Units 04/26/2022   10:04 AM 03/08/2022   11:49 AM 05/04/2020    2:20 PM  CBC  WBC 3.4 - 10.8 x10E3/uL 5.6  5.2    Hemoglobin 13.0 - 17.7 g/dL 14.3  15.0  15.6   Hematocrit 37.5 - 51.0 % 43.2  43.6    Platelets 150 - 450 x10E3/uL 155  184      No results found for: "LIPASE"    Latest Ref Rng & Units 04/26/2022   10:04 AM 03/08/2022   11:49 AM 04/10/2020   12:46 PM  CMP  Glucose 70 - 99 mg/dL 114  98    BUN 6 - 24 mg/dL 18  13    Creatinine 0.76 - 1.27 mg/dL 1.15  0.97    Sodium 134 - 144 mmol/L 143  144    Potassium 3.5 - 5.2 mmol/L 4.5  4.2    Chloride 96 - 106 mmol/L 104  105    CO2 20 - 29 mmol/L 22  22    Calcium 8.7 - 10.2 mg/dL 9.9  10.0    Total Protein 6.0 - 8.5 g/dL 7.0  6.6  6.9   Total Bilirubin 0.0 - 1.2 mg/dL 0.6  0.4  0.6   Alkaline Phos 44 - 121 IU/L 100  95  105   AST 0 - 40 IU/L 22  19  41   ALT 0 - 44 IU/L '12  9  25    '$ Echo oct 2021  LVEF 45-50%    Previous GI Evaluation  none   Imaging:  ECHOCARDIOGRAM COMPLETE    ECHOCARDIOGRAM REPORT       Patient Name:   Kevin Mora Date of  Exam: 03/20/2020 Medical Rec #:  [id number]      Height:       70.0 in Accession #:    [id number]     Weight:       186.0 lb Date of Birth:  September 06, 1965       BSA:          2.024 m Patient Age:    56 years       BP:           111/79 mmHg Patient Gender: M              HR:           64 bpm. Exam Location:  Inpatient  Procedure: 2D Echo  Indications:    427.32 atrial flutter   History:  Patient has prior history of Echocardiogram examinations, most                 recent 01/30/2018. Arrythmias:Atrial Flutter; Risk Factors:Current                 Smoker. Tobacco abuse.   Sonographer:    Jannett Celestine RDCS (AE) Referring Phys: [id number] STEPHANIE [place]   1. Left ventricular ejection fraction, by estimation, is 60 to 65%. The left ventricle has normal function. The left ventricle has no regional wall motion abnormalities. Left ventricular diastolic function could not be evaluated due to underlying atrial  flutter.  2. Right ventricular systolic function is normal. The right ventricular size is mildly enlarged. Tricuspid regurgitation signal is inadequate for assessing PA pressure.  3. The mitral valve is normal in structure. Moderate mitral valve regurgitation. No evidence of mitral stenosis. Regurgitant volume 28cc, MR ERO 0.22cm2  4. The aortic valve is normal in structure. Aortic valve regurgitation is not visualized. No aortic stenosis is present.  5. The inferior vena cava is dilated in size with >50% respiratory variability, suggesting right atrial pressure of 8 mmHg.  FINDINGS  Left Ventricle: Left ventricular ejection fraction, by estimation, is 45 to 50%. The left ventricle has mildly decreased function. The left ventricle demonstrates global hypokinesis. The left ventricular internal cavity size was normal in size. There is  mild asymmetric left ventricular hypertrophy of the septal segment. Left ventricular diastolic function could not be evaluated due to atrial  flutter. Left ventricular diastolic function could not be evaluated.  Right Ventricle: The right ventricular size is mildly enlarged. No increase in right ventricular wall thickness. Right ventricular systolic function is normal. Tricuspid regurgitation signal is inadequate for assessing PA pressure.  Left Atrium: Left atrial size was normal in size.  Right Atrium: Right atrial size was normal in size.  Pericardium: There is no evidence of pericardial effusion.  Mitral Valve: The mitral valve is normal in structure. Moderate mitral valve regurgitation. No evidence of mitral valve stenosis.  Tricuspid Valve: The tricuspid valve is normal in structure. Tricuspid valve regurgitation is trivial. No evidence of tricuspid stenosis.  Aortic Valve: The aortic valve is normal in structure. Aortic valve regurgitation is not visualized. No aortic stenosis is present.  Pulmonic Valve: The pulmonic valve was normal in structure. Pulmonic valve regurgitation is not visualized. No evidence of pulmonic stenosis.  Aorta: The aortic root is normal in size and structure.  Venous: The inferior vena cava is dilated in size with greater than 50% respiratory variability, suggesting right atrial pressure of 8 mmHg.  IAS/Shunts: No atrial level shunt detected by color flow Doppler.    LEFT VENTRICLE PLAX 2D LVIDd:         5.60 cm LVIDs:         4.20 cm LV PW:         1.10 cm LV IVS:        1.30 cm LVOT diam:     2.00 cm LV SV:         34 LV SV Index:   17 LVOT Area:     3.14 cm    RIGHT VENTRICLE TAPSE (M-mode): 1.4 cm  LEFT ATRIUM             Index       RIGHT ATRIUM           Index LA diam:        4.80 cm 2.37 cm/m  RA Area:     19.20 cm LA Vol (A2C):   87.6 ml 43.28 ml/m RA Volume:   52.70 ml  26.04 ml/m LA Vol (A4C):   56.6 ml 27.97 ml/m LA Biplane Vol: 70.7 ml 34.93 ml/m  AORTIC VALVE LVOT Vmax:   67.10 cm/s LVOT Vmean:  54.700 cm/s LVOT VTI:    0.109 m   AORTA Ao Root diam:  2.50 cm  MITRAL VALVE MV Area (PHT): 4.39 cm      SHUNTS MV Decel Time: 173 msec      Systemic VTI:  0.11 m MR Peak grad:    61.8 mmHg   Systemic Diam: 2.00 cm MR Mean grad:    42.0 mmHg MR Vmax:         393.00 cm/s MR Vmean:        306.0 cm/s MR PISA:         3.08 cm MR PISA Eff ROA: 22 mm MR PISA Radius:  0.70 cm MV E velocity: 135.00 cm/s  Fransico Him MD Electronically signed by Fransico Him MD Signature Date/Time: 03/20/2020/10:57:42 AM      Final      Past Medical History:  Diagnosis Date   Atrial flutter ([place]) 12/2017   Tobacco abuse    Past Surgical History:  Procedure Laterality Date   APPENDECTOMY     LIPOMA EXCISION     right shoulder   WISDOM TOOTH EXTRACTION     Family History  Problem Relation Age of Onset   Diabetes Mother    Hypertension Mother    Coronary artery disease Father    Social History   Tobacco Use   Smoking status: Every Day    Packs/day: 0.25    Years: 30.00    Total pack years: 7.50    Types: Cigarettes   Smokeless tobacco: Never  Vaping Use   Vaping Use: Never used  Substance Use Topics   Alcohol use: Yes    Comment: Socially    Drug use: Yes    Types: Marijuana   Current Outpatient Medications  Medication Sig Dispense Refill   albuterol (VENTOLIN HFA) 108 (90 Base) MCG/ACT inhaler Inhale 2 puffs into the lungs every 6 (six) hours as needed for wheezing or shortness of breath. 6.7 g 0   atorvastatin (LIPITOR) 40 MG tablet Take 1 tablet (40 mg total) by mouth daily. 90 tablet 3   cefUROXime (CEFTIN) 500 MG tablet Take 1 tablet (500 mg total) by mouth 2 (two) times daily with a meal. 10 tablet 0   doxycycline (VIBRA-TABS) 100 MG tablet Take 1 tablet (100 mg total) by mouth 2 (two) times daily. 10 tablet 0   metoprolol tartrate (LOPRESSOR) 100 MG tablet Take 1 tablet (100 mg total) by mouth 2 (two) times daily. 180 tablet 3   nicotine (NICODERM CQ) 7 mg/24hr patch Place 1 patch (7 mg total) onto the skin daily. 28 patch 3    rivaroxaban (XARELTO) 20 MG TABS tablet Take 1 tablet (20 mg total) by mouth daily with supper. 90 tablet 3   Zoster Vaccine Adjuvanted [facility]) injection Inject 0.5 mLs into the muscle every 2 (two) months for 2 doses. 1 mL 0   No current facility-administered medications for this visit.   No Known Allergies   Review of Systems: Positive for ***.  All other systems reviewed and negative except where noted in HPI.   Wt Readings from Last 3 Encounters:  04/26/22 210 lb 9.6 oz (95.5 kg)  03/08/22 209  lb 9.6 oz (95.1 kg)  05/04/20 186 lb (84.4 kg)    Physical Exam   There were no vitals taken for this visit. Constitutional:  Generally well appearing ***male in no acute distress. Psychiatric: Pleasant. Normal mood and affect. Behavior is normal. EENT: Pupils normal.  Conjunctivae are normal. No scleral icterus. Neck supple.  Cardiovascular: Normal rate, regular rhythm. No edema Pulmonary/chest: Effort normal and breath sounds normal. No wheezing, rales or rhonchi. Abdominal: Soft, nondistended, nontender. Bowel sounds active throughout. There are no masses palpable. No hepatomegaly. Neurological: Alert and oriented to person place and time. Skin: Skin is warm and dry. No rashes noted.  Tye Savoy, NP  04/28/2022, 9:47 AM  Cc:  Referring Provider Martyn Malay, MD

## 2022-04-29 ENCOUNTER — Ambulatory Visit: Payer: Commercial Managed Care - HMO | Attending: Physician Assistant

## 2022-04-29 ENCOUNTER — Other Ambulatory Visit: Payer: Self-pay

## 2022-04-29 ENCOUNTER — Encounter: Payer: Self-pay | Admitting: Family Medicine

## 2022-04-29 ENCOUNTER — Ambulatory Visit: Payer: Commercial Managed Care - HMO | Attending: Physician Assistant | Admitting: Physician Assistant

## 2022-04-29 ENCOUNTER — Encounter: Payer: Self-pay | Admitting: Physician Assistant

## 2022-04-29 VITALS — BP 152/98 | HR 84 | Ht 70.0 in | Wt 202.0 lb

## 2022-04-29 DIAGNOSIS — I483 Typical atrial flutter: Secondary | ICD-10-CM | POA: Diagnosis not present

## 2022-04-29 DIAGNOSIS — I10 Essential (primary) hypertension: Secondary | ICD-10-CM

## 2022-04-29 DIAGNOSIS — I509 Heart failure, unspecified: Secondary | ICD-10-CM

## 2022-04-29 NOTE — Patient Instructions (Signed)
Medication Instructions:    Your physician recommends that you continue on your current medications as directed. Please refer to the Current Medication list given to you today.   *If you need a refill on your cardiac medications before your next appointment, please call your pharmacy*   Lab Work: [place]    If you have labs (blood work) drawn today and your tests are completely normal, you will receive your results only by: [place] (if you have MyChart) OR A paper copy in the mail If you have any lab test that is abnormal or we need to change your treatment, we will call you to review the results.   Testing/Procedures: Your physician has recommended that you wear an event monitor. Event monitors are medical devices that record the heart's electrical activity. Doctors most often [place] these monitors to diagnose arrhythmias. Arrhythmias are problems with the speed or rhythm of the heartbeat. The monitor is a small, portable device. You can wear one while you do your normal daily activities. This is usually used to diagnose what is causing palpitations/syncope (passing out).      Follow-Up: At [facility], you and your health needs are our priority.  As part of our continuing mission to provide you with exceptional heart care, we have created designated Provider Care Teams.  These Care Teams include your primary Cardiologist (physician) and Advanced Practice Providers (APPs -  Physician Assistants and Nurse Practitioners) who all work together to provide you with the care you need, when you need it.  We recommend signing up for the patient portal called "MyChart".  Sign up information is provided on this After Visit Summary.  MyChart is used to connect with patients for Virtual Visits (Telemedicine).  Patients are able to view lab/test results, encounter notes, upcoming appointments, etc.  Non-urgent messages can be sent to your provider as well.   To learn more  about what you can do with MyChart, go to [email or web address].    Your next appointment:   6 week(s) ( CONTACT [place] FOR EP SCHEDULING ISSUES )   The format for your next appointment:   In Person  Provider:   Allegra Lai, MD ONLY    Other Instructions  ZIO XT- Long Term Monitor Instructions  Your physician has requested you wear a ZIO patch monitor for 7  days.  This is a single patch monitor. [employer] supplies one patch monitor per enrollment. Additional stickers are not available. Please do not apply patch if you will be having a Nuclear Stress Test,  Echocardiogram, Cardiac CT, MRI, or Chest Xray during the period you would be wearing the  monitor. The patch cannot be worn during these tests. You cannot remove and re-apply the  ZIO XT patch monitor.  Your ZIO patch monitor will be mailed 3 day USPS to your address on file. It may take 3-5 days  to receive your monitor after you have been enrolled.  Once you have received your monitor, please review the enclosed instructions. Your monitor  has already been registered assigning a specific monitor serial # to you.  Billing and Patient Assistance Program Information  We have supplied [employer] with any of your insurance information on file for billing purposes. [employer] offers a sliding scale Patient Assistance Program for patients that do not have  insurance, or whose insurance does not completely cover the cost of the ZIO monitor.  You must apply for the Patient Assistance Program to qualify for this discounted rate.  To apply, please call [employer] at [phone], select option 4, select option 2, ask to apply for  Patient Assistance Program. Theodore Demark will ask your household income, and how many people  are in your household. They will quote your out-of-pocket cost based on that information.  [employer] will also be able to set up a 69-month interest-free payment plan if needed.  Applying the monitor   Shave hair from upper  left chest.  Hold abrader disc by orange tab. Rub abrader in 40 strokes over the upper left chest as  indicated in your monitor instructions.  Clean area with 4 enclosed alcohol pads. Let dry.  Apply patch as indicated in monitor instructions. Patch will be placed under collarbone on left  side of chest with arrow pointing upward.  Rub patch adhesive wings for 2 minutes. Remove white label marked "1". Remove the white  label marked "2". Rub patch adhesive wings for 2 additional minutes.  While looking in a mirror, press and release button in center of patch. A small green light will  flash 3-4 times. This will be your only indicator that the monitor has been turned on.  Do not shower for the first 24 hours. You may shower after the first 24 hours.  Press the button if you feel a symptom. You will hear a small click. Record Date, Time and  Symptom in the Patient Logbook.  When you are ready to remove the patch, follow instructions on the last 2 pages of Patient  Logbook. Stick patch monitor onto the last page of Patient Logbook.  Place Patient Logbook in the blue and white box. Use locking tab on box and tape box closed  securely. The blue and white box has prepaid postage on it. Please place it in the mailbox as  soon as possible. Your physician should have your test results approximately 7 days after the  monitor has been mailed back to [facility]  Call [employer] [place] [phone] you have questions regarding  your ZIO XT patch monitor. Call them immediately if you see an orange light blinking on your  monitor.  If your monitor falls off in less than 4 days, contact our Monitor department at [phone]  If your monitor becomes loose or falls off after 4 days call [employer] at [phone]  suggestions on securing your monitor  Important Information About [place]

## 2022-04-29 NOTE — Progress Notes (Unsigned)
Enrolled patient for a 7 day Zio XT monitor to be mailed to patients home  Dr Curt Bears to read

## 2022-05-05 ENCOUNTER — Telehealth: Payer: Self-pay | Admitting: Family Medicine

## 2022-05-05 ENCOUNTER — Ambulatory Visit (HOSPITAL_COMMUNITY)
Admission: RE | Admit: 2022-05-05 | Discharge: 2022-05-05 | Disposition: A | Discharge status: Home / Self Care | Payer: Commercial Managed Care - HMO | Source: Ambulatory Visit | Attending: Family Medicine | Admitting: Family Medicine

## 2022-05-05 DIAGNOSIS — I483 Typical atrial flutter: Secondary | ICD-10-CM | POA: Diagnosis not present

## 2022-05-05 LAB — ECHOCARDIOGRAM COMPLETE
Calc EF: 31.7 %
S' Lateral: 5.1 cm
Single Plane A2C EF: 37.5 %
Single Plane A4C EF: 23 %

## 2022-05-05 NOTE — Telephone Encounter (Signed)
Attempted to call with echocardiogram results.  Dorris Singh, MD  Family Medicine Teaching Service

## 2022-05-09 ENCOUNTER — Encounter: Payer: Self-pay | Admitting: Physician Assistant

## 2022-05-09 ENCOUNTER — Telehealth: Payer: Self-pay | Admitting: Family Medicine

## 2022-05-09 NOTE — Telephone Encounter (Signed)
Attempted to call patient again.  Dorris Singh, MD  Family Medicine Teaching Service

## 2022-05-10 ENCOUNTER — Telehealth: Payer: Self-pay | Admitting: Family Medicine

## 2022-05-10 ENCOUNTER — Encounter: Payer: Self-pay | Admitting: Family Medicine

## 2022-05-10 NOTE — Telephone Encounter (Signed)
Attempted to call X2 regarding echocardiogram, significant EF changes. Unable to leave message.   Will send certified letter.  Dorris Singh, MD  Family Medicine Teaching Service

## 2022-05-12 ENCOUNTER — Encounter: Payer: Self-pay | Admitting: Vascular Surgery

## 2022-05-18 ENCOUNTER — Telehealth: Payer: Self-pay

## 2022-05-18 ENCOUNTER — Telehealth: Payer: Self-pay | Admitting: Cardiology

## 2022-05-18 NOTE — Telephone Encounter (Signed)
Received critical result- given at the time of another critical result.   Rapid Aflutter 258 bpm for 60 seconds on 05/07/22 at 2:14 AM.   Requested report be faxed over to be reviewed by DOD. Will route to [address] triage to make aware and follow.   Thank you!

## 2022-05-18 NOTE — Telephone Encounter (Signed)
Caller is reporting critical results.

## 2022-05-18 NOTE — Telephone Encounter (Signed)
Telephone 05/18/2022 [facility] [place]. [facility], Kayren Eaves, RN   All Conversations (Newest Message First) May 18, 2022       05/18/22  9:53 AM Tor Netters, RN routed this conversation to Deboraha Sprang, MD  Baldwin Jamaica, PA-C  Constance Haw, MD  Stanton Kidney, RN  Thora Lance, RN Tor Netters, RN      05/18/22  9:50 AM Note     Cardiac Monitor Alert   Date of alert:  05/18/2022    Patient Name: Kevin Mora  DOB: 14-Apr-1966  MRN: [id number]    [place] Cardiologist: None  [facility] EP:  Will Meredith Leeds, MD     Monitor Information: Long Term Monitor [ZioXT]  Reason:  Aflutter  Ordering provider:  Tommye Standard, PA   Alert Atrial Fibrillation/Flutter and Ventricular Tachycardia This is the 1st alert for this rhythm.  The patient has a hx of Atrial Fibrillation/Flutter.  The patient is taking Xarelto.  }  Anticoagulation medication as of 05/18/2022                rivaroxaban (XARELTO) 20 MG TABS tablet Take 1 tablet (20 mg total) by mouth daily with supper.           Next Cardiology Appointment   Date:  06/14/2022  Provider:  Dr Curt Bears   The patient could NOT be reached by telephone today.  Left VM with for his mother to have him call us back.  Arrhythmia, symptoms and history reviewed with Dr Caryl Comes.  Plan:  Have patient call back for Dr Caryl Comes.       Tor Netters, RN  05/18/2022 9:45 AM

## 2022-05-18 NOTE — Telephone Encounter (Signed)
Please see previous heart monitor alert note on this event from today.

## 2022-05-18 NOTE — Telephone Encounter (Signed)
   Cardiac Monitor Alert  Date of alert:  05/18/2022   Patient Name: Kevin Mora  DOB: [birth year]  MRN: [id number]   [facility] Cardiologist: None  [facility] EP:  Will Meredith Leeds, MD    Monitor Information: Long Term Monitor [ZioXT]  Reason:  Aflutter  Ordering provider:  Tommye Standard, PA   Alert Atrial Fibrillation/Flutter and Ventricular Tachycardia This is the 1st alert for this rhythm.  The patient has a hx of Atrial Fibrillation/Flutter.  The patient is taking Xarelto.  }  Anticoagulation medication as of 05/18/2022           rivaroxaban (XARELTO) 20 MG TABS tablet Take 1 tablet (20 mg total) by mouth daily with supper.       Next Cardiology Appointment   Date:  06/14/2022  Provider:  Dr Curt Bears  The patient could NOT be reached by telephone today.  Left VM with for his mother to have him call us back.  Arrhythmia, symptoms and history reviewed with Dr Caryl Comes.  Plan:  Have patient call back for Dr Caryl Comes.     Tor Netters, RN  05/18/2022 9:45 AM

## 2022-05-19 NOTE — Telephone Encounter (Signed)
Unable to leave a message. VM full.

## 2022-05-19 NOTE — Telephone Encounter (Signed)
Pt did not return call to Dr Caryl Comes, [place] on 05/18/2022.

## 2022-05-20 ENCOUNTER — Telehealth: Payer: Self-pay

## 2022-05-20 NOTE — Telephone Encounter (Signed)
Do you remember whether this was the patient whose mother I called ?

## 2022-05-20 NOTE — Telephone Encounter (Signed)
Dr Curt Bears has reviewed final monitor report and sent the result to Tommye Standard, PA-C.  Pt scheduled to see Dr Curt Bears on 06/14/2021.    Patch Wear Time:  6 days and 19 hours   100% atrial flutter burden <1% ventricular and supraventricular ectopy burden Triggered episodes associated with atrial flutter Max 263 bpm 02:15am, 12/09 Min 60 bpm 01:42am, 12/14 Avg 111 bpm   Allegra Lai, MD

## 2022-05-20 NOTE — Telephone Encounter (Signed)
Attempted phone call to pt.(3rd attempt) Unable to leave voicemail message as VM has not been set up.    Dr Curt Bears has reviewed final  monitor report and sent the result to Tommye Standard, PA-C.  Pt scheduled to see Dr Curt Bears on 06/14/2021.       Patch Wear Time:  6 days and 19 hours   100% atrial flutter burden <1% ventricular and supraventricular ectopy burden Triggered episodes associated with atrial flutter Max 263 bpm 02:15am, 12/09 Min 60 bpm 01:42am, 12/14 Avg 111 bpm   Allegra Lai, MD

## 2022-05-26 ENCOUNTER — Other Ambulatory Visit: Payer: Self-pay | Admitting: *Deleted

## 2022-05-26 DIAGNOSIS — M79606 Pain in leg, unspecified: Secondary | ICD-10-CM

## 2022-05-31 ENCOUNTER — Encounter: Payer: Self-pay | Admitting: Vascular Surgery

## 2022-05-31 ENCOUNTER — Ambulatory Visit (INDEPENDENT_AMBULATORY_CARE_PROVIDER_SITE_OTHER): Payer: Commercial Managed Care - HMO | Admitting: Vascular Surgery

## 2022-05-31 ENCOUNTER — Ambulatory Visit (HOSPITAL_COMMUNITY)
Admission: RE | Admit: 2022-05-31 | Discharge: 2022-05-31 | Disposition: A | Discharge status: Home / Self Care | Payer: Commercial Managed Care - HMO | Source: Ambulatory Visit | Attending: Vascular Surgery | Admitting: Vascular Surgery

## 2022-05-31 VITALS — BP 152/100 | HR 98 | Temp 98.0°F | Ht 70.0 in | Wt 200.0 lb

## 2022-05-31 DIAGNOSIS — M79606 Pain in leg, unspecified: Secondary | ICD-10-CM | POA: Insufficient documentation

## 2022-05-31 DIAGNOSIS — I70219 Atherosclerosis of native arteries of extremities with intermittent claudication, unspecified extremity: Secondary | ICD-10-CM | POA: Insufficient documentation

## 2022-05-31 DIAGNOSIS — I70213 Atherosclerosis of native arteries of extremities with intermittent claudication, bilateral legs: Secondary | ICD-10-CM

## 2022-05-31 NOTE — Progress Notes (Signed)
Patient name: Kevin Mora MRN: [id number] DOB: 1966/01/24 Sex: male  REASON FOR CONSULT: Intermittent leg pain with claudication  HPI: Kevin Mora is a 57 y.o. male, with history of atrial flutter on Xarelto and tobacco abuse who presents for evaluation of bilateral lower extremity intermittent leg pain with claudication.  He describes having having pain in both legs since 2022.  He used to be able to walk to the bus stop in 10 minutes and now it takes 25 minutes.  Pain is in both calves.  States he has to stop after walking 1 block.  He cannot ride a bicycle more than 1 block.  Now unable to work or find employment because of his legs.  Down to smoking 2 to 3 cigarettes a day.  Past Medical History:  Diagnosis Date   Atrial flutter ([place]) 12/2017   Tobacco abuse     Past Surgical History:  Procedure Laterality Date   APPENDECTOMY     LIPOMA EXCISION     right shoulder   WISDOM TOOTH EXTRACTION      Family History  Problem Relation Age of Onset   Diabetes Mother    Hypertension Mother    Coronary artery disease Father     SOCIAL HISTORY: Social History   Socioeconomic History   Marital status: Single    Spouse name: Not on file   Number of children: Not on file   Years of education: Not on file   Highest education level: Not on file  Occupational History   Not on file  Tobacco Use   Smoking status: Every Day    Packs/day: 0.25    Years: 30.00    Total pack years: 7.50    Types: Cigarettes   Smokeless tobacco: Never  Vaping Use   Vaping Use: Never used  Substance and Sexual Activity   Alcohol use: Yes    Comment: Socially    Drug use: Yes    Types: Marijuana   Sexual activity: Yes    Partners: Male  Other Topics Concern   Not on file  Social History Narrative   Previously worked in Psych[employer], educational now just working intermittently.  He lives with his mother.  He reports his other 2 siblings have passed away.   Social Determinants of Health   Financial  Resource Strain: Medium Risk (01/10/2018)   Overall Financial Resource Strain (CARDIA)    Difficulty of Paying Living Expenses: Somewhat hard  Food Insecurity: Food Insecurity Present (01/10/2018)   Hunger Vital Sign    Worried About Running Out of Food in the Last Year: Sometimes true    Ran Out of Food in the Last Year: Sometimes true  Transportation Needs: Unmet Transportation Needs (01/10/2018)   PRAPARE - Hydrologist (Medical): Yes    Lack of Transportation (Non-Medical): Yes  Physical Activity: Sufficiently Active (01/10/2018)   Exercise Vital Sign    Days of Exercise per Week: 7 days    Minutes of Exercise per Session: 60 min  Stress: No Stress Concern Present (01/10/2018)   [place] of Stress : Not at all  Social Connections: Unknown (01/10/2018)   Social Connection and Isolation Panel [NHANES]    Frequency of Communication with Friends and Family: More than three times a week    Frequency of Social Gatherings with Friends and Family: Once a week    Attends Religious Services: More than 4 times per  year    Active Member of Clubs or Organizations: No    Attends Archivist Meetings: Never    Marital Status: Patient refused  Intimate Partner Violence: Not At Risk (01/10/2018)   Humiliation, Afraid, Rape, and Kick questionnaire    Fear of Current or Ex-Partner: No    Emotionally Abused: No    Physically Abused: No    Sexually Abused: No    No Known Allergies  Current Outpatient Medications  Medication Sig Dispense Refill   albuterol (VENTOLIN HFA) 108 (90 Base) MCG/ACT inhaler Inhale 2 puffs into the lungs every 6 (six) hours as needed for wheezing or shortness of breath. 6.7 g 0   atorvastatin (LIPITOR) 40 MG tablet Take 1 tablet (40 mg total) by mouth daily. 90 tablet 3   cefUROXime (CEFTIN) 500 MG tablet Take 1 tablet (500 mg total) by mouth 2 (two) times daily  with a meal. 10 tablet 0   doxycycline (VIBRA-TABS) 100 MG tablet Take 1 tablet (100 mg total) by mouth 2 (two) times daily. 10 tablet 0   metoprolol tartrate (LOPRESSOR) 100 MG tablet Take 1 tablet (100 mg total) by mouth 2 (two) times daily. 180 tablet 3   nicotine (NICODERM CQ) 7 mg/24hr patch Place 1 patch (7 mg total) onto the skin daily. 28 patch 3   rivaroxaban (XARELTO) 20 MG TABS tablet Take 1 tablet (20 mg total) by mouth daily with supper. 90 tablet 3   No current facility-administered medications for this visit.    REVIEW OF SYSTEMS:  '[X]'$  denotes positive finding, '[ ]'$  denotes negative finding Cardiac  Comments:  Chest pain or chest pressure:    Shortness of breath upon exertion:    Short of breath when lying flat:    Irregular heart rhythm:        Vascular    Pain in calf, thigh, or hip brought on by ambulation: x   Pain in feet at night that wakes you up from your sleep:     Blood clot in your veins:    Leg swelling:         Pulmonary    Oxygen at home:    Productive cough:     Wheezing:         Neurologic    Sudden weakness in arms or legs:     Sudden numbness in arms or legs:     Sudden onset of difficulty speaking or slurred speech:    Temporary loss of vision in one eye:     Problems with dizziness:         Gastrointestinal    Blood in stool:     Vomited blood:         Genitourinary    Burning when urinating:     Blood in urine:        Psychiatric    Major depression:         Hematologic    Bleeding problems:    Problems with blood clotting too easily:        Skin    Rashes or ulcers:        Constitutional    Fever or chills:      PHYSICAL EXAM: Vitals:   05/31/22 1330  BP: (!) 152/100  Pulse: 98  Temp: 98 F (36.7 C)  TempSrc: Temporal  SpO2: 95%  Weight: 200 lb (90.7 kg)  Height: '5\' 10"'$  (1.778 m)    GENERAL: The patient is a well-nourished male, in no  acute distress. The vital signs are documented above. CARDIAC: There is a  regular rate and rhythm.  VASCULAR:  Palpable femoral pulses bilaterally No palpable pedal pulses No lower extremity tissue loss PULMONARY: No respiratory distress.. ABDOMEN: Soft and non-tender. MUSCULOSKELETAL: There are no major deformities or cyanosis. NEUROLOGIC: No focal weakness or paresthesias are detected. SKIN: There are no ulcers or rashes noted. PSYCHIATRIC: The patient has a normal affect.  DATA:   ABI's today are 0.57 on the right monophasic and 0.78 on the left monophasic  Assessment/Plan:  57 year old male with history of atrial flutter on Xarelto and tobacco abuse who presents for evaluation of bilateral lower extremity intermittent claudication.  His ABIs are more decreased on the right at 0.57 although he states both the leg symptoms are about the same.  I discussed managing claudication with lifestyle modification before intervention unless it is lifestyle limiting and disabling.  He feels his symptoms are very disabling and he is unable to work or find employment because his legs hurt so much when he walks.  States he really cannot ride a bike more than a block or walk more than a block.  As a result, I have offered him aortogram, lower extremity arteriogram, possible intervention.  Discussed we will focus on one leg at a time.  Will delay his angiogram until early February given he has evidence of new onset heart failure with an EF of 30 to 35% and is scheduled to see cardiology next week.  I can certainly delay his intervention until later if needed as discussed since this is elective for claudication.   Marty Heck, MD [facility] Office: [phone]

## 2022-06-03 ENCOUNTER — Telehealth: Payer: Self-pay

## 2022-06-03 NOTE — Telephone Encounter (Signed)
Multiple attempts made to reach patient to schedule aortogram, no answer. Received a recording to enter a numeric message. Office phone number entered.

## 2022-06-06 ENCOUNTER — Ambulatory Visit: Payer: Commercial Managed Care - HMO | Admitting: Nurse Practitioner

## 2022-06-07 ENCOUNTER — Telehealth: Payer: Self-pay

## 2022-06-07 NOTE — Telephone Encounter (Signed)
Attempted to reach pt and received the same voicemail as the last several attempts. Entered numeric number (of office) for pt to return the call. I have attempted to reach his alternate contact who is listed as his mother and VM is not set up.

## 2022-06-08 NOTE — Telephone Encounter (Signed)
Unable to reach patient at number provided in chart. Entered office phone number for numeric message. Will mail UTR letter to patient.

## 2022-06-13 ENCOUNTER — Ambulatory Visit: Payer: Commercial Managed Care - HMO | Admitting: Cardiology

## 2022-06-14 ENCOUNTER — Encounter: Payer: Self-pay | Admitting: *Deleted

## 2022-06-14 ENCOUNTER — Ambulatory Visit: Payer: Commercial Managed Care - HMO | Attending: Cardiology | Admitting: Cardiology

## 2022-06-14 ENCOUNTER — Encounter: Payer: Self-pay | Admitting: Cardiology

## 2022-06-14 ENCOUNTER — Other Ambulatory Visit: Payer: Self-pay

## 2022-06-14 VITALS — BP 148/100 | HR 80 | Ht 70.0 in | Wt 204.0 lb

## 2022-06-14 DIAGNOSIS — D6869 Other thrombophilia: Secondary | ICD-10-CM | POA: Diagnosis not present

## 2022-06-14 DIAGNOSIS — I5022 Chronic systolic (congestive) heart failure: Secondary | ICD-10-CM

## 2022-06-14 DIAGNOSIS — I483 Typical atrial flutter: Secondary | ICD-10-CM

## 2022-06-14 MED ORDER — AMIODARONE HCL 200 MG PO TABS: ORAL_TABLET | ORAL | 2 refills | Status: AC

## 2022-06-14 MED ORDER — CARVEDILOL 25 MG PO TABS: 25.0000 mg | ORAL_TABLET | Freq: Two times a day (BID) | ORAL | 3 refills | Status: DC

## 2022-06-14 MED ORDER — ENTRESTO 24-26 MG PO TABS: 1.0000 | ORAL_TABLET | Freq: Two times a day (BID) | ORAL | 1 refills | Status: AC

## 2022-06-14 NOTE — H&P (View-Only) (Signed)
Electrophysiology Office Note   Date:  06/14/2022   ID:  Broox Lonigro, DOB 08/30/1965, MRN [id number]  PCP:  Martyn Malay, MD  Cardiologist:   Primary Electrophysiologist:  Titan Karner Meredith Leeds, MD    Chief Complaint: Atrial flutter   History of Present Illness: Kevin Mora is a 57 y.o. male who is being seen today for the evaluation of atrial flutter at the request of Martyn Malay, MD. Presenting today for electrophysiology evaluation.  Is a history seen for chronic systolic heart failure, atrial flutter, hypertension, tobacco abuse.  He was diagnosed with atrial flutter in 2019 but was lost to follow-up.  Presented to his primary physician's office in 2021 with rapid heart rates and high blood pressure as well as crushing chest pain.  He went to urgent care was found to be in atrial flutter.  He was started on metoprolol for rate control.  He has since had a repeat echo that showed an ejection fraction of 30 to 35%.  Wore a cardiac monitor that showed an average heart rate of 111 bpm.  Today, denies symptoms of palpitations, chest pain, shortness of breath, orthopnea, PND, lower extremity edema, claudication, dizziness, presyncope, syncope, bleeding, or neurologic sequela. The patient is tolerating medications without difficulties.  He has issues with chest pain, fatigue, shortness of breath.  He has noticed this over the last few months.  He also has lower extremity pain, and is followed by vascular surgery.  He understands that with his current heart function and atrial flutter, he would benefit from rhythm control and medication adjustment.  Past Medical History:  Diagnosis Date   Atrial flutter ([place]) 12/2017   Tobacco abuse    Past Surgical History:  Procedure Laterality Date   APPENDECTOMY     LIPOMA EXCISION     right shoulder   WISDOM TOOTH EXTRACTION       Current Outpatient Medications  Medication Sig Dispense Refill   amiodarone (PACERONE) 200 MG tablet Take  2 tablets (400 mg total) TWICE a day for 2 weeks, then take 1 tablet (200 mg total) TWICE a day for 2 weeks, then take 1 tablet (200 mg total) ONCE a day 90 tablet 2   atorvastatin (LIPITOR) 40 MG tablet Take 1 tablet (40 mg total) by mouth daily. 90 tablet 3   carvedilol (COREG) 25 MG tablet Take 1 tablet (25 mg total) by mouth 2 (two) times daily. 60 tablet 3   doxycycline (VIBRA-TABS) 100 MG tablet Take 1 tablet (100 mg total) by mouth 2 (two) times daily. 10 tablet 0   nicotine (NICODERM CQ) 7 mg/24hr patch Place 1 patch (7 mg total) onto the skin daily. 28 patch 3   rivaroxaban (XARELTO) 20 MG TABS tablet Take 1 tablet (20 mg total) by mouth daily with supper. 90 tablet 3   [START ON 06/28/2022] sacubitril-valsartan (ENTRESTO) 24-26 MG Take 1 tablet by mouth 2 (two) times daily. 60 tablet 1   albuterol (VENTOLIN HFA) 108 (90 Base) MCG/ACT inhaler Inhale 2 puffs into the lungs every 6 (six) hours as needed for wheezing or shortness of breath. (Patient not taking: Reported on 06/14/2022) 6.7 g 0   cefUROXime (CEFTIN) 500 MG tablet Take 1 tablet (500 mg total) by mouth 2 (two) times daily with a meal. (Patient not taking: Reported on 06/14/2022) 10 tablet 0   No current facility-administered medications for this visit.    Allergies:   Patient has no known allergies.   Social History:  The  patient  reports that he has been smoking cigarettes. He has a 7.50 pack-year smoking history. He has never used smokeless tobacco. He reports current alcohol use. He reports current drug use. Drug: Marijuana.   Family History:  The patient's family history includes Coronary artery disease in his father; Diabetes in his mother; Hypertension in his mother.   ROS:  Please see the history of present illness.   Otherwise, review of systems is positive for none.   All other systems are reviewed and negative.   PHYSICAL EXAM: VS:  BP (!) 148/100   Pulse 80   Ht '5\' 10"'$  (1.778 m)   Wt 204 lb (92.5 kg)   SpO2 98%    BMI 29.27 kg/m  , BMI Body mass index is 29.27 kg/m. GEN: Well nourished, well developed, in no acute distress  HEENT: normal  Neck: no JVD, carotid bruits, or masses Cardiac: irregular; no murmurs, rubs, or gallops,no edema  Respiratory:  clear to auscultation bilaterally, normal work of breathing GI: soft, nontender, nondistended, + BS MS: no deformity or atrophy  Skin: warm and dry Neuro:  Strength and sensation are intact Psych: euthymic mood, full affect  EKG:  EKG is ordered today. Personal review of the ekg ordered shows atrial flutter   Recent Labs: 04/26/2022: ALT 12; BNP 352.7; BUN 18; Creatinine, Ser 1.15; Hemoglobin 14.3; Platelets 155; Potassium 4.5; Sodium 143    Lipid Panel     Component Value Date/Time   CHOL 220 (H) 03/08/2022 1149   TRIG 154 (H) 03/08/2022 1149   HDL 39 (L) 03/08/2022 1149   CHOLHDL 5.6 (H) 03/08/2022 1149   CHOLHDL 4.3 03/20/2020 0025   VLDL 15 03/20/2020 0025   LDLCALC 153 (H) 03/08/2022 1149   LDLDIRECT 115 (H) 04/26/2022 1004     Wt Readings from Last 3 Encounters:  06/14/22 204 lb (92.5 kg)  05/31/22 200 lb (90.7 kg)  04/29/22 202 lb (91.6 kg)      Other studies Reviewed: Additional studies/ records that were reviewed today include: TTE 05/05/22 Review of the above records today demonstrates:   1. Left ventricular ejection fraction, by estimation, is 30 to 35%. Left  ventricular ejection fraction by 2D MOD biplane is 31.7 %. The left  ventricle has moderately decreased function. The left ventricle  demonstrates global hypokinesis. The left  ventricular internal cavity size was mildly dilated. Left ventricular  diastolic function could not be evaluated.   2. Right ventricular systolic function is normal. The right ventricular  size is normal. There is normal pulmonary artery systolic pressure. The  estimated right ventricular systolic pressure is 16.1 mmHg.   3. Left atrial size was mildly dilated.   4. Notable splay. The  mitral valve is abnormal. Moderate to severe mitral  valve regurgitation. There is mild late systolic prolapse of the middle  segment of the anterior leaflet of the mitral valve.   5. The aortic valve is tricuspid. Aortic valve regurgitation is mild.  Aortic valve sclerosis is present, with no evidence of aortic valve  stenosis.   6. The inferior vena cava is dilated in size with >50% respiratory  variability, suggesting right atrial pressure of 8 mmHg.   7. Rhythm strip during this exam demonstrates atrial flutter.   Cardiac monitor 05/19/2022 personally reviewed 100% atrial flutter burden <1% ventricular and supraventricular ectopy burden Triggered episodes associated with atrial flutter Max 263 bpm 02:15am, 12/09 Min 60 bpm 01:42am, 12/14 Avg 111 bpm  ASSESSMENT AND PLAN:  1.  Typical atrial flutter: Currently on Xarelto and metoprolol, dose as above.  CHA2DS2-VASc of 2.  He remains in atrial flutter.  Unfortunately his cardiac function has now gone down.  Average heart rate in atrial flutter is 111 bpm.  He would benefit from rhythm control.  Christos Mixson start him on amiodarone.  Markese Bloxham plan for cardioversion.  2.  Hypertension: Significantly elevated.  Plan to start  3.  Chronic systolic heart failure: Has had low ejection fractions in the past.  Ejection fraction now 30 to 35%.  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  That being said, his heart rate is elevated.  Lanie Schelling stop his metoprolol and start him on carvedilol 25 mg twice daily.  Abbegayle Denault also start him on Entresto and have him follow-up with pharmacy.  4.  Secondary hypercoagulable state: Currently on Xarelto for atrial fibrillation as above.  Current medicines are reviewed at length with the patient today.   The patient does not have concerns regarding his medicines.  The following changes were made today: Stop metoprolol, start carvedilol, Entresto, amiodarone  Labs/ tests ordered today include:  Orders Placed This  Encounter  Procedures   EKG 12-Lead   ECHOCARDIOGRAM COMPLETE     Disposition:   FU 1 months  Signed, Tamirra Sienkiewicz Meredith Leeds, MD  06/14/2022 1:45 PM     [address] [place] [id number] [phone] (office) [phone] (fax)

## 2022-06-14 NOTE — Patient Instructions (Addendum)
Medication Instructions:  Your physician has recommended you make the following change in your medication:  STOP Metoprolol Tartrate (Lopressor) START Carvedilol 25 twice daily START Amiodarone  - take 2 tablets (400 mg total) TWICE a day for 2 weeks, then  - take 1 tablet (200 mg total) TWICE a day for 2 weeks, then  - take 1 tablet (200 mg total) ONCE a day START Entresto 24/26 mg twice daily -- start this on 06/28/2021  *If you need a refill on your cardiac medications before your next appointment, please call your pharmacy*   Lab Work: None ordered If you have labs (blood work) drawn today and your tests are completely normal, you will receive your results only by: [place] (if you have MyChart) OR A paper copy in the mail If you have any lab test that is abnormal or we need to change your treatment, we will call you to review the results.   Testing/Procedures: Your physician has requested that you have an echocardiogram in 3 months. Echocardiography is a painless test that uses sound waves to create images of your heart. It provides your doctor with information about the size and shape of your heart and how well your heart's chambers and valves are working. This procedure takes approximately one hour. There are no restrictions for this procedure. Please do NOT wear cologne, perfume, aftershave, or lotions (deodorant is allowed). Please arrive 15 minutes prior to your appointment time.   Follow-Up: At [facility], you and your health needs are our priority.  As part of our continuing mission to provide you with exceptional heart care, we have created designated Provider Care Teams.  These Care Teams include your primary Cardiologist (physician) and Advanced Practice Providers (APPs -  Physician Assistants and Nurse Practitioners) who all work together to provide you with the care you need, when you need it.  We recommend signing up for the patient portal called "MyChart".   Sign up information is provided on this After Visit Summary.  MyChart is used to connect with patients for Virtual Visits (Telemedicine).  Patients are able to view lab/test results, encounter notes, upcoming appointments, etc.  Non-urgent messages can be sent to your provider as well.   To learn more about what you can do with MyChart, go to [email or web address].    Your next appointment:   4 week(s)  The format for your next appointment:   In Person  Provider:   pharmD  (after entresto start)    Your physician recommends that you schedule a follow-up appointment in: the AFib clinic week of February 12th   Your physician recommends that you schedule a follow-up appointment in: 4 months (after your echocardiogram has been completed)    Thank you for choosing [facility]!!   Trinidad Curet, RN [phone]  Other Instructions

## 2022-06-14 NOTE — Progress Notes (Signed)
Electrophysiology Office Note   Date:  06/14/2022   ID:  Kevin Mora, DOB 05-Oct-1965, MRN [id number]  PCP:  Martyn Malay, MD  Cardiologist:   Primary Electrophysiologist:  Zonia Caplin Meredith Leeds, MD    Chief Complaint: Atrial flutter   History of Present Illness: Kevin Mora is a 57 y.o. male who is being seen today for the evaluation of atrial flutter at the request of Martyn Malay, MD. Presenting today for electrophysiology evaluation.  Is a history seen for chronic systolic heart failure, atrial flutter, hypertension, tobacco abuse.  He was diagnosed with atrial flutter in 2019 but was lost to follow-up.  Presented to his primary physician's office in 2021 with rapid heart rates and high blood pressure as well as crushing chest pain.  He went to urgent care was found to be in atrial flutter.  He was started on metoprolol for rate control.  He has since had a repeat echo that showed an ejection fraction of 30 to 35%.  Wore a cardiac monitor that showed an average heart rate of 111 bpm.  Today, denies symptoms of palpitations, chest pain, shortness of breath, orthopnea, PND, lower extremity edema, claudication, dizziness, presyncope, syncope, bleeding, or neurologic sequela. The patient is tolerating medications without difficulties.  He has issues with chest pain, fatigue, shortness of breath.  He has noticed this over the last few months.  He also has lower extremity pain, and is followed by vascular surgery.  He understands that with his current heart function and atrial flutter, he would benefit from rhythm control and medication adjustment.  Past Medical History:  Diagnosis Date   Atrial flutter ([place]) 12/2017   Tobacco abuse    Past Surgical History:  Procedure Laterality Date   APPENDECTOMY     LIPOMA EXCISION     right shoulder   WISDOM TOOTH EXTRACTION       Current Outpatient Medications  Medication Sig Dispense Refill   amiodarone (PACERONE) 200 MG tablet Take  2 tablets (400 mg total) TWICE a day for 2 weeks, then take 1 tablet (200 mg total) TWICE a day for 2 weeks, then take 1 tablet (200 mg total) ONCE a day 90 tablet 2   atorvastatin (LIPITOR) 40 MG tablet Take 1 tablet (40 mg total) by mouth daily. 90 tablet 3   carvedilol (COREG) 25 MG tablet Take 1 tablet (25 mg total) by mouth 2 (two) times daily. 60 tablet 3   doxycycline (VIBRA-TABS) 100 MG tablet Take 1 tablet (100 mg total) by mouth 2 (two) times daily. 10 tablet 0   nicotine (NICODERM CQ) 7 mg/24hr patch Place 1 patch (7 mg total) onto the skin daily. 28 patch 3   rivaroxaban (XARELTO) 20 MG TABS tablet Take 1 tablet (20 mg total) by mouth daily with supper. 90 tablet 3   [START ON 06/28/2022] sacubitril-valsartan (ENTRESTO) 24-26 MG Take 1 tablet by mouth 2 (two) times daily. 60 tablet 1   albuterol (VENTOLIN HFA) 108 (90 Base) MCG/ACT inhaler Inhale 2 puffs into the lungs every 6 (six) hours as needed for wheezing or shortness of breath. (Patient not taking: Reported on 06/14/2022) 6.7 g 0   cefUROXime (CEFTIN) 500 MG tablet Take 1 tablet (500 mg total) by mouth 2 (two) times daily with a meal. (Patient not taking: Reported on 06/14/2022) 10 tablet 0   No current facility-administered medications for this visit.    Allergies:   Patient has no known allergies.   Social History:  The  patient  reports that he has been smoking cigarettes. He has a 7.50 pack-year smoking history. He has never used smokeless tobacco. He reports current alcohol use. He reports current drug use. Drug: Marijuana.   Family History:  The patient's family history includes Coronary artery disease in his father; Diabetes in his mother; Hypertension in his mother.   ROS:  Please see the history of present illness.   Otherwise, review of systems is positive for none.   All other systems are reviewed and negative.   PHYSICAL EXAM: VS:  BP (!) 148/100   Pulse 80   Ht '5\' 10"'$  (1.778 m)   Wt 204 lb (92.5 kg)   SpO2 98%    BMI 29.27 kg/m  , BMI Body mass index is 29.27 kg/m. GEN: Well nourished, well developed, in no acute distress  HEENT: normal  Neck: no JVD, carotid bruits, or masses Cardiac: irregular; no murmurs, rubs, or gallops,no edema  Respiratory:  clear to auscultation bilaterally, normal work of breathing GI: soft, nontender, nondistended, + BS MS: no deformity or atrophy  Skin: warm and dry Neuro:  Strength and sensation are intact Psych: euthymic mood, full affect  EKG:  EKG is ordered today. Personal review of the ekg ordered shows atrial flutter   Recent Labs: 04/26/2022: ALT 12; BNP 352.7; BUN 18; Creatinine, Ser 1.15; Hemoglobin 14.3; Platelets 155; Potassium 4.5; Sodium 143    Lipid Panel     Component Value Date/Time   CHOL 220 (H) 03/08/2022 1149   TRIG 154 (H) 03/08/2022 1149   HDL 39 (L) 03/08/2022 1149   CHOLHDL 5.6 (H) 03/08/2022 1149   CHOLHDL 4.3 03/20/2020 0025   VLDL 15 03/20/2020 0025   LDLCALC 153 (H) 03/08/2022 1149   LDLDIRECT 115 (H) 04/26/2022 1004     Wt Readings from Last 3 Encounters:  06/14/22 204 lb (92.5 kg)  05/31/22 200 lb (90.7 kg)  04/29/22 202 lb (91.6 kg)      Other studies Reviewed: Additional studies/ records that were reviewed today include: TTE 05/05/22 Review of the above records today demonstrates:   1. Left ventricular ejection fraction, by estimation, is 30 to 35%. Left  ventricular ejection fraction by 2D MOD biplane is 31.7 %. The left  ventricle has moderately decreased function. The left ventricle  demonstrates global hypokinesis. The left  ventricular internal cavity size was mildly dilated. Left ventricular  diastolic function could not be evaluated.   2. Right ventricular systolic function is normal. The right ventricular  size is normal. There is normal pulmonary artery systolic pressure. The  estimated right ventricular systolic pressure is 40.9 mmHg.   3. Left atrial size was mildly dilated.   4. Notable splay. The  mitral valve is abnormal. Moderate to severe mitral  valve regurgitation. There is mild late systolic prolapse of the middle  segment of the anterior leaflet of the mitral valve.   5. The aortic valve is tricuspid. Aortic valve regurgitation is mild.  Aortic valve sclerosis is present, with no evidence of aortic valve  stenosis.   6. The inferior vena cava is dilated in size with >50% respiratory  variability, suggesting right atrial pressure of 8 mmHg.   7. Rhythm strip during this exam demonstrates atrial flutter.   Cardiac monitor 05/19/2022 personally reviewed 100% atrial flutter burden <1% ventricular and supraventricular ectopy burden Triggered episodes associated with atrial flutter Max 263 bpm 02:15am, 12/09 Min 60 bpm 01:42am, 12/14 Avg 111 bpm  ASSESSMENT AND PLAN:  1.  Typical atrial flutter: Currently on Xarelto and metoprolol, dose as above.  CHA2DS2-VASc of 2.  He remains in atrial flutter.  Unfortunately his cardiac function has now gone down.  Average heart rate in atrial flutter is 111 bpm.  He would benefit from rhythm control.  Kaula Klenke start him on amiodarone.  Afreen Siebels plan for cardioversion.  2.  Hypertension: Significantly elevated.  Plan to start  3.  Chronic systolic heart failure: Has had low ejection fractions in the past.  Ejection fraction now 30 to 35%.  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  That being said, his heart rate is elevated.  Kasy Iannacone stop his metoprolol and start him on carvedilol 25 mg twice daily.  Levante Simones also start him on Entresto and have him follow-up with pharmacy.  4.  Secondary hypercoagulable state: Currently on Xarelto for atrial fibrillation as above.  Current medicines are reviewed at length with the patient today.   The patient does not have concerns regarding his medicines.  The following changes were made today: Stop metoprolol, start carvedilol, Entresto, amiodarone  Labs/ tests ordered today include:  Orders Placed This  Encounter  Procedures   EKG 12-Lead   ECHOCARDIOGRAM COMPLETE     Disposition:   FU 1 months  Signed, Lanora Reveron Meredith Leeds, MD  06/14/2022 1:45 PM     [address] [place] [id number] [phone] (office) [phone] (fax)

## 2022-06-15 ENCOUNTER — Telehealth: Payer: Self-pay | Admitting: Cardiology

## 2022-06-15 NOTE — Telephone Encounter (Signed)
Attempted to call patient, no answer/unable to leave message.  Will try again later.

## 2022-06-15 NOTE — Telephone Encounter (Signed)
Patient called to talk with Dr. Curt Bears or nurse in regards to a medication question. Please call back

## 2022-06-15 NOTE — Telephone Encounter (Signed)
Patient is returning call. He would like to go over medication instructions.

## 2022-06-15 NOTE — Telephone Encounter (Signed)
Attempted to return call to patient again, no answer/unable to leave message.  When patient calls back please transfer him to triage.

## 2022-06-16 NOTE — Telephone Encounter (Signed)
Received return call from patient.  Patient states he was able to pick-up his Entresto. His question is, does Dr. Curt Bears still want him to wait until 06/28/22 to start the Entresto? Or should he start taking it now?   Will forward to Dr. Macky Lower nurse to follow-up.

## 2022-06-16 NOTE — Telephone Encounter (Signed)
Attempted to reach pt, unable to leave message

## 2022-06-22 ENCOUNTER — Ambulatory Visit: Payer: Commercial Managed Care - HMO | Admitting: Student

## 2022-06-22 ENCOUNTER — Other Ambulatory Visit: Payer: Self-pay

## 2022-06-28 ENCOUNTER — Ambulatory Visit: Payer: Commercial Managed Care - HMO | Admitting: Nurse Practitioner

## 2022-06-30 ENCOUNTER — Ambulatory Visit (HOSPITAL_BASED_OUTPATIENT_CLINIC_OR_DEPARTMENT_OTHER): Payer: BLUE CROSS/BLUE SHIELD | Admitting: Certified Registered"

## 2022-06-30 ENCOUNTER — Encounter (HOSPITAL_COMMUNITY)
Admission: RE | Disposition: A | Discharge status: Home / Self Care | Payer: Self-pay | Source: Home / Self Care | Attending: Cardiovascular Disease

## 2022-06-30 ENCOUNTER — Encounter (HOSPITAL_COMMUNITY): Payer: Self-pay | Admitting: Cardiovascular Disease

## 2022-06-30 ENCOUNTER — Ambulatory Visit (HOSPITAL_COMMUNITY)
Admission: RE | Admit: 2022-06-30 | Discharge: 2022-06-30 | Disposition: A | Discharge status: Home / Self Care | Payer: BLUE CROSS/BLUE SHIELD | Attending: Cardiovascular Disease | Admitting: Cardiovascular Disease

## 2022-06-30 ENCOUNTER — Other Ambulatory Visit: Payer: Self-pay

## 2022-06-30 ENCOUNTER — Ambulatory Visit (HOSPITAL_COMMUNITY): Payer: BLUE CROSS/BLUE SHIELD | Admitting: Certified Registered"

## 2022-06-30 ENCOUNTER — Telehealth: Payer: Self-pay | Admitting: Nurse Practitioner

## 2022-06-30 DIAGNOSIS — I4892 Unspecified atrial flutter: Secondary | ICD-10-CM

## 2022-06-30 DIAGNOSIS — Z79899 Other long term (current) drug therapy: Secondary | ICD-10-CM | POA: Insufficient documentation

## 2022-06-30 DIAGNOSIS — I110 Hypertensive heart disease with heart failure: Secondary | ICD-10-CM | POA: Diagnosis not present

## 2022-06-30 DIAGNOSIS — F17210 Nicotine dependence, cigarettes, uncomplicated: Secondary | ICD-10-CM

## 2022-06-30 DIAGNOSIS — I10 Essential (primary) hypertension: Secondary | ICD-10-CM

## 2022-06-30 DIAGNOSIS — I4891 Unspecified atrial fibrillation: Secondary | ICD-10-CM | POA: Diagnosis not present

## 2022-06-30 DIAGNOSIS — I483 Typical atrial flutter: Secondary | ICD-10-CM | POA: Insufficient documentation

## 2022-06-30 DIAGNOSIS — I5022 Chronic systolic (congestive) heart failure: Secondary | ICD-10-CM | POA: Insufficient documentation

## 2022-06-30 DIAGNOSIS — Z7901 Long term (current) use of anticoagulants: Secondary | ICD-10-CM | POA: Insufficient documentation

## 2022-06-30 DIAGNOSIS — I739 Peripheral vascular disease, unspecified: Secondary | ICD-10-CM

## 2022-06-30 HISTORY — PX: CARDIOVERSION: SHX1299

## 2022-06-30 SURGERY — CARDIOVERSION
Anesthesia: General

## 2022-06-30 MED ORDER — SODIUM CHLORIDE 0.9 % IV SOLN: INTRAVENOUS | Status: DC | PRN

## 2022-06-30 MED ORDER — LIDOCAINE HCL (CARDIAC) PF 100 MG/5ML IV SOSY
PREFILLED_SYRINGE | INTRAVENOUS | Status: DC | PRN
  Administered 2022-06-30: 100 mg via INTRAVENOUS

## 2022-06-30 MED ORDER — PROPOFOL 10 MG/ML IV BOLUS
INTRAVENOUS | Status: DC | PRN
  Administered 2022-06-30: 100 mg via INTRAVENOUS

## 2022-06-30 MED ORDER — CARVEDILOL 25 MG PO TABS: 12.5000 mg | ORAL_TABLET | Freq: Two times a day (BID) | ORAL | 3 refills | Status: AC

## 2022-06-30 NOTE — Discharge Instructions (Addendum)
Do not take your Coreg today.   Starting tomorrow, you can take 1/2 of a tablet 2 times a day.  Electrical Cardioversion Electrical cardioversion is the delivery of a jolt of electricity to restore a normal rhythm to the heart. A rhythm that is too fast or is not regular keeps the heart from pumping well. In this procedure, sticky patches or metal paddles are placed on the chest to deliver electricity to the heart from a device. This procedure may be done in an emergency if: There is low or no blood pressure as a result of the heart rhythm. Normal rhythm must be restored as fast as possible to protect the brain and heart from further damage. It may save a life. This may also be a scheduled procedure for irregular or fast heart rhythms that are not immediately life-threatening.  What can I expect after the procedure? Your blood pressure, heart rate, breathing rate, and blood oxygen level will be monitored until you leave the hospital or clinic. Your heart rhythm will be watched to make sure it does not change. You may have some redness on the skin where the shocks were given. Over the counter cortizone cream may be helpful.  Follow these instructions at home: Do not drive for 24 hours if you were given a sedative during your procedure. Take over-the-counter and prescription medicines only as told by your health care provider. Ask your health care provider how to check your pulse. Check it often. Rest for 48 hours after the procedure or as told by your health care provider. Avoid or limit your caffeine use as told by your health care provider. Keep all follow-up visits as told by your health care provider. This is important. Contact a health care provider if: You feel like your heart is beating too quickly or your pulse is not regular. You have a serious muscle cramp that does not go away. Get help right away if: You have discomfort in your chest. You are dizzy or you feel faint. You have  trouble breathing or you are short of breath. Your speech is slurred. You have trouble moving an arm or leg on one side of your body. Your fingers or toes turn cold or blue. Summary Electrical cardioversion is the delivery of a jolt of electricity to restore a normal rhythm to the heart. This procedure may be done right away in an emergency or may be a scheduled procedure if the condition is not an emergency. Generally, this is a safe procedure. After the procedure, check your pulse often as told by your health care provider. This information is not intended to replace advice given to you by your health care provider. Make sure you discuss any questions you have with your health care provider. Document Revised: 12/17/2018 Document Reviewed: 12/17/2018 Elsevier Patient Education  [place].

## 2022-06-30 NOTE — Anesthesia Preprocedure Evaluation (Signed)
Anesthesia Evaluation  Patient identified by MRN, date of birth, ID band Patient awake    Reviewed: Allergy & Precautions, H&P , NPO status , Patient's Chart, lab work & pertinent test results  Airway Mallampati: II  TM Distance: >3 FB Neck ROM: Full    Dental no notable dental hx.    Pulmonary neg pulmonary ROS, Current Smoker and Patient abstained from smoking.   Pulmonary exam normal breath sounds clear to auscultation       Cardiovascular hypertension, Pt. on medications + Peripheral Vascular Disease  Normal cardiovascular exam+ dysrhythmias Atrial Fibrillation  Rhythm:Regular Rate:Normal     Neuro/Psych negative neurological ROS  negative psych ROS   GI/Hepatic negative GI ROS, Neg liver ROS,,,  Endo/Other  negative endocrine ROS    Renal/GU negative Renal ROS  negative genitourinary   Musculoskeletal negative musculoskeletal ROS (+)    Abdominal   Peds negative pediatric ROS (+)  Hematology negative hematology ROS (+)   Anesthesia Other Findings   Reproductive/Obstetrics negative OB ROS                             Anesthesia Physical Anesthesia Plan  ASA: 3  Anesthesia Plan: General   Post-op Pain Management: Minimal or no pain anticipated   Induction: Intravenous  PONV Risk Score and Plan: 1 and Ondansetron and Treatment may vary due to age or medical condition  Airway Management Planned: Mask  Additional Equipment:   Intra-op Plan:   Post-operative Plan:   Informed Consent: I have reviewed the patients History and Physical, chart, labs and discussed the procedure including the risks, benefits and alternatives for the proposed anesthesia with the patient or authorized representative who has indicated his/her understanding and acceptance.     Dental advisory given  Plan Discussed with: CRNA  Anesthesia Plan Comments:        Anesthesia Quick Evaluation

## 2022-06-30 NOTE — Interval H&P Note (Signed)
History and Physical Interval Note:  06/30/2022 10:19 AM  Kevin Mora  has presented today for surgery, with the diagnosis of AFLUTTER.  The various methods of treatment have been discussed with the patient and family. After consideration of risks, benefits and other options for treatment, the patient has consented to  Procedure(s): CARDIOVERSION (N/A) as a surgical intervention.  The patient's history has been reviewed, patient examined, no change in status, stable for surgery.  I have reviewed the patient's chart and labs.  Questions were answered to the patient's satisfaction.     Jenkins Rouge

## 2022-06-30 NOTE — Transfer of Care (Signed)
Immediate Anesthesia Transfer of Care Note  Patient: Kevin Mora  Procedure(s) Performed: CARDIOVERSION  Patient Location: PACU and Endoscopy Unit  Anesthesia Type:General  Level of Consciousness: drowsy  Airway & Oxygen Therapy: Patient Spontanous Breathing and Patient connected to nasal cannula oxygen  Post-op Assessment: Report given to RN and Post -op Vital signs reviewed and stable  Post vital signs: Reviewed and stable  Last Vitals:  Vitals Value Taken Time  BP    Temp    Pulse    Resp    SpO2      Last Pain:  Vitals:   06/30/22 1019  PainSc: 0-No pain         Complications: No notable events documented.

## 2022-06-30 NOTE — CV Procedure (Signed)
DCC: On Rx DOAC Anesthesia: Propofol  DCC x 2 120 then 150 J  Converted from flutter rate 68 to SB rate 48 bpm  No immediate neurologic sequela  HR slow in sinus No coreg today and in am  Decrease coreg dose to 12.5 mg bid  Jenkins Rouge MD [facility]

## 2022-06-30 NOTE — Anesthesia Postprocedure Evaluation (Signed)
Anesthesia Post Note  Patient: Kevin Mora  Procedure(s) Performed: CARDIOVERSION     Patient location during evaluation: PACU Anesthesia Type: General Level of consciousness: awake and alert Pain management: pain level controlled Vital Signs Assessment: post-procedure vital signs reviewed and stable Respiratory status: spontaneous breathing, nonlabored ventilation and respiratory function stable Cardiovascular status: blood pressure returned to baseline and stable Postop Assessment: no apparent nausea or vomiting Anesthetic complications: no   No notable events documented.  Last Vitals:  Vitals:   06/30/22 1100 06/30/22 1110  BP: 119/82 116/83  Pulse: (!) 44 (!) 46  Resp: (!) 23 19  SpO2: 99% 97%    Last Pain:  Vitals:   06/30/22 1110  PainSc: 0-No pain                 Lynda Rainwater

## 2022-07-04 ENCOUNTER — Telehealth: Payer: Self-pay | Admitting: Cardiology

## 2022-07-04 NOTE — Telephone Encounter (Signed)
Pt c/o medication issue:  1. Name of Medication:   sacubitril-valsartan (ENTRESTO) 24-26 MG    2. How are you currently taking this medication (dosage and times per day)?  Take 1 tablet by mouth 2 (two) times daily.  3. Are you having a reaction (difficulty breathing--STAT)? No  4. What is your medication issue? Pt states that medication is causing him to have headaches and nose bleeds. He would like a callback regarding this matter.

## 2022-07-05 NOTE — Telephone Encounter (Addendum)
Pt reports one nose bleed episode yesterday. Pt will continue monitoring and let us know if bleeding occurs again. Started Entresto 1/30. He has had 2 HA but none today. He will also keep a monitor on this and let us know if becomes persistent/daily occurrence. Pt appreciates the return call/discussion. Pt agreeable to plan.

## 2022-07-08 ENCOUNTER — Inpatient Hospital Stay (HOSPITAL_COMMUNITY)
Admission: EM | Admit: 2022-07-08 | Discharge: 2022-07-29 | DRG: 309 | Disposition: E | Payer: BLUE CROSS/BLUE SHIELD | Attending: Pulmonary Disease | Admitting: Pulmonary Disease

## 2022-07-08 ENCOUNTER — Emergency Department (HOSPITAL_COMMUNITY): Payer: BLUE CROSS/BLUE SHIELD

## 2022-07-08 DIAGNOSIS — Z79899 Other long term (current) drug therapy: Secondary | ICD-10-CM

## 2022-07-08 DIAGNOSIS — Z7901 Long term (current) use of anticoagulants: Secondary | ICD-10-CM

## 2022-07-08 DIAGNOSIS — I469 Cardiac arrest, cause unspecified: Secondary | ICD-10-CM | POA: Diagnosis not present

## 2022-07-08 DIAGNOSIS — R7989 Other specified abnormal findings of blood chemistry: Secondary | ICD-10-CM | POA: Diagnosis present

## 2022-07-08 DIAGNOSIS — Z8249 Family history of ischemic heart disease and other diseases of the circulatory system: Secondary | ICD-10-CM

## 2022-07-08 DIAGNOSIS — N179 Acute kidney failure, unspecified: Secondary | ICD-10-CM | POA: Diagnosis not present

## 2022-07-08 DIAGNOSIS — I340 Nonrheumatic mitral (valve) insufficiency: Secondary | ICD-10-CM | POA: Diagnosis present

## 2022-07-08 DIAGNOSIS — E872 Acidosis, unspecified: Secondary | ICD-10-CM | POA: Diagnosis present

## 2022-07-08 DIAGNOSIS — Z833 Family history of diabetes mellitus: Secondary | ICD-10-CM

## 2022-07-08 DIAGNOSIS — Z66 Do not resuscitate: Secondary | ICD-10-CM | POA: Diagnosis not present

## 2022-07-08 DIAGNOSIS — I5022 Chronic systolic (congestive) heart failure: Secondary | ICD-10-CM | POA: Diagnosis present

## 2022-07-08 DIAGNOSIS — E785 Hyperlipidemia, unspecified: Secondary | ICD-10-CM | POA: Diagnosis present

## 2022-07-08 DIAGNOSIS — I110 Hypertensive heart disease with heart failure: Secondary | ICD-10-CM | POA: Diagnosis present

## 2022-07-08 DIAGNOSIS — R001 Bradycardia, unspecified: Secondary | ICD-10-CM | POA: Diagnosis not present

## 2022-07-08 DIAGNOSIS — R7401 Elevation of levels of liver transaminase levels: Secondary | ICD-10-CM | POA: Diagnosis present

## 2022-07-08 DIAGNOSIS — Z1152 Encounter for screening for COVID-19: Secondary | ICD-10-CM

## 2022-07-08 DIAGNOSIS — G931 Anoxic brain damage, not elsewhere classified: Secondary | ICD-10-CM | POA: Diagnosis present

## 2022-07-08 DIAGNOSIS — Z515 Encounter for palliative care: Secondary | ICD-10-CM

## 2022-07-08 DIAGNOSIS — I959 Hypotension, unspecified: Secondary | ICD-10-CM

## 2022-07-08 DIAGNOSIS — R5383 Other fatigue: Secondary | ICD-10-CM | POA: Diagnosis present

## 2022-07-08 DIAGNOSIS — F17210 Nicotine dependence, cigarettes, uncomplicated: Secondary | ICD-10-CM | POA: Diagnosis present

## 2022-07-08 DIAGNOSIS — E1151 Type 2 diabetes mellitus with diabetic peripheral angiopathy without gangrene: Secondary | ICD-10-CM | POA: Diagnosis present

## 2022-07-08 DIAGNOSIS — R519 Headache, unspecified: Secondary | ICD-10-CM | POA: Diagnosis present

## 2022-07-08 DIAGNOSIS — I428 Other cardiomyopathies: Secondary | ICD-10-CM | POA: Diagnosis present

## 2022-07-08 DIAGNOSIS — I462 Cardiac arrest due to underlying cardiac condition: Secondary | ICD-10-CM | POA: Diagnosis present

## 2022-07-08 DIAGNOSIS — I161 Hypertensive emergency: Secondary | ICD-10-CM | POA: Diagnosis present

## 2022-07-08 DIAGNOSIS — I4892 Unspecified atrial flutter: Secondary | ICD-10-CM | POA: Diagnosis present

## 2022-07-08 LAB — I-STAT ARTERIAL BLOOD GAS, ED
Acid-base deficit: 6 mmol/L — ABNORMAL HIGH (ref 0.0–2.0)
Bicarbonate: 22.7 mmol/L (ref 20.0–28.0)
Calcium, Ion: 1.59 mmol/L (ref 1.15–1.40)
HCT: 42 % (ref 39.0–52.0)
Hemoglobin: 14.3 g/dL (ref 13.0–17.0)
O2 Saturation: 99 %
Patient temperature: 98.6
Potassium: 6.3 mmol/L (ref 3.5–5.1)
Sodium: 138 mmol/L (ref 135–145)
TCO2: 24 mmol/L (ref 22–32)
pCO2 arterial: 55.4 mmHg — ABNORMAL HIGH (ref 32–48)
pH, Arterial: 7.22 — ABNORMAL LOW (ref 7.35–7.45)
pO2, Arterial: 150 mmHg — ABNORMAL HIGH (ref 83–108)

## 2022-07-08 LAB — CBC WITH DIFFERENTIAL/PLATELET
Abs Immature Granulocytes: 0 10*3/uL (ref 0.00–0.07)
Basophils Absolute: 0.1 10*3/uL (ref 0.0–0.1)
Basophils Relative: 1 %
Eosinophils Absolute: 0.2 10*3/uL (ref 0.0–0.5)
Eosinophils Relative: 3 %
HCT: 43.8 % (ref 39.0–52.0)
Hemoglobin: 13.2 g/dL (ref 13.0–17.0)
Lymphocytes Relative: 44 %
Lymphs Abs: 2.4 10*3/uL (ref 0.7–4.0)
MCH: 29.4 pg (ref 26.0–34.0)
MCHC: 30.1 g/dL (ref 30.0–36.0)
MCV: 97.6 fL (ref 80.0–100.0)
Monocytes Absolute: 0.1 10*3/uL (ref 0.1–1.0)
Monocytes Relative: 2 %
Neutro Abs: 2.8 10*3/uL (ref 1.7–7.7)
Neutrophils Relative %: 50 %
Platelets: 145 10*3/uL — ABNORMAL LOW (ref 150–400)
RBC: 4.49 MIL/uL (ref 4.22–5.81)
RDW: 15.3 % (ref 11.5–15.5)
WBC: 5.5 10*3/uL (ref 4.0–10.5)
nRBC: 0 % (ref 0.0–0.2)
nRBC: 0 /100 WBC

## 2022-07-08 LAB — COMPREHENSIVE METABOLIC PANEL
ALT: 109 U/L — ABNORMAL HIGH (ref 0–44)
AST: 203 U/L — ABNORMAL HIGH (ref 15–41)
Albumin: 2.8 g/dL — ABNORMAL LOW (ref 3.5–5.0)
Alkaline Phosphatase: 160 U/L — ABNORMAL HIGH (ref 38–126)
Anion gap: 14 (ref 5–15)
BUN: 11 mg/dL (ref 6–20)
CO2: 18 mmol/L — ABNORMAL LOW (ref 22–32)
Calcium: 8.4 mg/dL — ABNORMAL LOW (ref 8.9–10.3)
Chloride: 107 mmol/L (ref 98–111)
Creatinine, Ser: 1.7 mg/dL — ABNORMAL HIGH (ref 0.61–1.24)
GFR, Estimated: 47 mL/min — ABNORMAL LOW (ref >60)
Glucose, Bld: 244 mg/dL — ABNORMAL HIGH (ref 70–99)
Potassium: 5.3 mmol/L — ABNORMAL HIGH (ref 3.5–5.1)
Sodium: 139 mmol/L (ref 135–145)
Total Bilirubin: 0.6 mg/dL (ref 0.3–1.2)
Total Protein: 5.7 g/dL — ABNORMAL LOW (ref 6.5–8.1)

## 2022-07-08 LAB — I-STAT CHEM 8, ED
BUN: 13 mg/dL (ref 6–20)
Calcium, Ion: 1.14 mmol/L — ABNORMAL LOW (ref 1.15–1.40)
Chloride: 104 mmol/L (ref 98–111)
Creatinine, Ser: 1.5 mg/dL — ABNORMAL HIGH (ref 0.61–1.24)
Glucose, Bld: 230 mg/dL — ABNORMAL HIGH (ref 70–99)
HCT: 43 % (ref 39.0–52.0)
Hemoglobin: 14.6 g/dL (ref 13.0–17.0)
Potassium: 5.1 mmol/L (ref 3.5–5.1)
Sodium: 141 mmol/L (ref 135–145)
TCO2: 26 mmol/L (ref 22–32)

## 2022-07-08 LAB — TROPONIN I (HIGH SENSITIVITY): Troponin I (High Sensitivity): 67 ng/L — ABNORMAL HIGH (ref <18)

## 2022-07-08 LAB — I-STAT VENOUS BLOOD GAS, ED
Acid-base deficit: 10 mmol/L — ABNORMAL HIGH (ref 0.0–2.0)
Bicarbonate: 23.3 mmol/L (ref 20.0–28.0)
Calcium, Ion: 1.18 mmol/L (ref 1.15–1.40)
HCT: 42 % (ref 39.0–52.0)
Hemoglobin: 14.3 g/dL (ref 13.0–17.0)
O2 Saturation: 96 %
Potassium: 5 mmol/L (ref 3.5–5.1)
Sodium: 140 mmol/L (ref 135–145)
TCO2: 26 mmol/L (ref 22–32)
pCO2, Ven: 90.3 mmHg (ref 44–60)
pH, Ven: 7.019 — CL (ref 7.25–7.43)
pO2, Ven: 130 mmHg — ABNORMAL HIGH (ref 32–45)

## 2022-07-08 LAB — CBG MONITORING, ED: Glucose-Capillary: 202 mg/dL — ABNORMAL HIGH (ref 70–99)

## 2022-07-08 LAB — LACTIC ACID, PLASMA: Lactic Acid, Venous: 7.2 mmol/L (ref 0.5–1.9)

## 2022-07-08 MED ORDER — SODIUM BICARBONATE 8.4 % IV SOLN
INTRAVENOUS | Status: AC | PRN
  Administered 2022-07-08 (×3): 50 meq via INTRAVENOUS

## 2022-07-08 MED ORDER — SODIUM CHLORIDE 0.9 % IV SOLN: INTRAVENOUS | Status: DC | PRN

## 2022-07-08 MED ORDER — SODIUM CHLORIDE 0.9 % IV SOLN
INTRAVENOUS | Status: DC
  Administered 2022-07-08: 1000 mL via INTRAVENOUS

## 2022-07-08 MED ORDER — EPINEPHRINE 1 MG/10ML IJ SOSY
PREFILLED_SYRINGE | INTRAMUSCULAR | Status: AC | PRN
  Administered 2022-07-08 (×2): 1 mg via INTRAVENOUS

## 2022-07-08 MED ORDER — CLEVIDIPINE BUTYRATE 0.5 MG/ML IV EMUL
0.0000 mg/h | INTRAVENOUS | Status: DC
  Administered 2022-07-09: 2 mg/h via INTRAVENOUS
  Administered 2022-07-09: 3 mg/h via INTRAVENOUS
  Filled 2022-07-08 (×2): qty 100

## 2022-07-08 MED ORDER — EPINEPHRINE HCL 5 MG/250ML IV SOLN IN NS
0.5000 ug/min | INTRAVENOUS | Status: DC
  Administered 2022-07-08: 20 ug/min via INTRAVENOUS

## 2022-07-08 MED ORDER — PROPOFOL 1000 MG/100ML IV EMUL
5.0000 ug/kg/min | INTRAVENOUS | Status: DC
  Administered 2022-07-08: 10 ug/kg/min via INTRAVENOUS
  Administered 2022-07-09: 40 ug/kg/min via INTRAVENOUS
  Administered 2022-07-09: 30 ug/kg/min via INTRAVENOUS
  Administered 2022-07-09: 40 ug/kg/min via INTRAVENOUS
  Filled 2022-07-08 (×4): qty 100

## 2022-07-08 MED ORDER — ATROPINE SULFATE 1 MG/10ML IJ SOSY
1.0000 mg | PREFILLED_SYRINGE | Freq: Once | INTRAMUSCULAR | Status: AC
  Administered 2022-07-08: 1 mg via INTRAVENOUS

## 2022-07-08 MED ORDER — NOREPINEPHRINE 4 MG/250ML-% IV SOLN
INTRAVENOUS | Status: AC | PRN
  Administered 2022-07-08: 20 ug/min via INTRAVENOUS

## 2022-07-08 MED ORDER — EPINEPHRINE 0.1 MG/10ML (10 MCG/ML) SYRINGE FOR IV PUSH (FOR BLOOD PRESSURE SUPPORT)
20.0000 ug | PREFILLED_SYRINGE | Freq: Once | INTRAVENOUS | Status: AC
  Administered 2022-07-08: 20 ug via INTRAVENOUS
  Filled 2022-07-08: qty 10

## 2022-07-08 MED ORDER — IOHEXOL 350 MG/ML SOLN
75.0000 mL | Freq: Once | INTRAVENOUS | Status: AC | PRN
  Administered 2022-07-08: 75 mL via INTRAVENOUS

## 2022-07-08 MED ORDER — CALCIUM CHLORIDE 10 % IV SOLN
INTRAVENOUS | Status: AC | PRN
  Administered 2022-07-08 (×2): 1 g via INTRAVENOUS

## 2022-07-08 NOTE — ED Notes (Signed)
Prior to arrival in ED pt recived ACLS in field with a total of 45 minutes of CPR with

## 2022-07-08 NOTE — Progress Notes (Signed)
Pt transported to CT and back without incident.

## 2022-07-08 NOTE — Code Documentation (Signed)
Pt switched over to zoll/one-step pads, cpr resumed

## 2022-07-08 NOTE — Code Documentation (Signed)
Pulse was lost, pt in asystole, cpr resumed

## 2022-07-08 NOTE — Code Documentation (Signed)
CPR discontinued due to previous pulse check

## 2022-07-08 NOTE — Code Documentation (Signed)
CPR on arival into ED, continued once moved over to bed, LUCAS device being used

## 2022-07-08 NOTE — ED Notes (Signed)
Person named "Reggie" called and asked for the mother of this patient to be given his number, [phone]. He states that he was told that she was in with the patient and she may be in there now or she may be on her way back.

## 2022-07-08 NOTE — Code Documentation (Signed)
Cpr stopped, MD verbalizing orders

## 2022-07-08 NOTE — Code Documentation (Signed)
ETT in place, 26 at the lips, color change noted with equal bilateral breath sounds

## 2022-07-08 NOTE — Code Documentation (Signed)
Pulse check: no pulse 

## 2022-07-08 NOTE — ED Provider Notes (Signed)
[facility] Provider Note   CSN: [id number] Arrival date & time: 07/16/2022  2006     History {Add pertinent medical, surgical, social history, OB history to HPI:1} Chief Complaint  Patient presents with   Cardiac Arrest    Found down, medic reports 3 ROSC event in transit, with a 45 total time of CPR,     Kevin Mora is a 57 y.o. male.   Cardiac Arrest      Home Medications Prior to Admission medications   Medication Sig Start Date End Date Taking? Authorizing Provider  albuterol (VENTOLIN HFA) 108 (90 Base) MCG/ACT inhaler Inhale 2 puffs into the lungs every 6 (six) hours as needed for wheezing or shortness of breath. Patient not taking: Reported on 06/14/2022 04/26/22   Martyn Malay, MD  amiodarone (PACERONE) 200 MG tablet Take 2 tablets (400 mg total) TWICE a day for 2 weeks, then take 1 tablet (200 mg total) TWICE a day for 2 weeks, then take 1 tablet (200 mg total) ONCE a day 06/14/22   Camnitz, Ocie Doyne, MD  atorvastatin (LIPITOR) 40 MG tablet Take 1 tablet (40 mg total) by mouth daily. 04/26/22   Martyn Malay, MD  carvedilol (COREG) 25 MG tablet Take 0.5 tablets (12.5 mg total) by mouth 2 (two) times daily. 07/01/22   Josue Hector, MD  cefUROXime (CEFTIN) 500 MG tablet Take 1 tablet (500 mg total) by mouth 2 (two) times daily with a meal. Patient not taking: Reported on 06/14/2022 04/26/22   Martyn Malay, MD  doxycycline (VIBRA-TABS) 100 MG tablet Take 1 tablet (100 mg total) by mouth 2 (two) times daily. 04/26/22   Martyn Malay, MD  nicotine (NICODERM CQ) 7 mg/24hr patch Place 1 patch (7 mg total) onto the skin daily. 04/26/22   Martyn Malay, MD  rivaroxaban (XARELTO) 20 MG TABS tablet Take 1 tablet (20 mg total) by mouth daily with supper. 04/26/22   Martyn Malay, MD  sacubitril-valsartan (ENTRESTO) 24-26 MG Take 1 tablet by mouth 2 (two) times daily. 06/28/22   Camnitz, Ocie Doyne, MD  diltiazem (CARDIZEM  CD) 120 MG 24 hr capsule Take 1 capsule (120 mg total) by mouth daily. Patient not taking: Reported on 11/15/2018 02/19/18 11/15/18  Martyn Malay, MD      Allergies    Patient has no known allergies.    Review of Systems   Review of Systems  Physical Exam Updated Vital Signs BP (!) 229/91   Pulse (!) 2   Resp (!) 22  Physical Exam  ED Results / Procedures / Treatments   Labs (all labs ordered are listed, but only abnormal results are displayed) Labs Reviewed  CBG MONITORING, ED - Abnormal; Notable for the following components:      Result Value   Glucose-Capillary 202 (*)    All other components within normal limits  CBC WITH DIFFERENTIAL/PLATELET  COMPREHENSIVE METABOLIC PANEL  LACTIC ACID, PLASMA  LACTIC ACID, PLASMA  I-STAT CHEM 8, ED  I-STAT VENOUS BLOOD GAS, ED  TROPONIN I (HIGH SENSITIVITY)    EKG None  Radiology No results found.  Procedures Procedures  {Document cardiac monitor, telemetry assessment procedure when appropriate:1}  Medications Ordered in ED Medications  EPINEPHrine (ADRENALIN) 5 mg in NS 250 mL (0.02 mg/mL) premix infusion (40 mcg/min Intravenous Rate/Dose Change 07/03/2022 2019)  EPINEPHrine (ADRENALIN) 1 MG/10ML injection (1 mg Intravenous Given 07/07/2022 2007)  norepinephrine (LEVOPHED) 23m in 2568m(0.016 mg/mL)  premix infusion (0 mcg/kg/min Intravenous Paused 07/07/2022 2044)  calcium chloride injection (1 g Intravenous Given 07/21/2022 2043)  0.9 %  sodium chloride infusion (has no administration in time range)  EPINEPhrine 10 mcg/mL Adult IV Push Syringe (For Blood Pressure Support) (20 mcg Intravenous Given 07/21/2022 2015)  atropine 1 MG/10ML injection 1 mg (1 mg Intravenous Given 07/25/2022 2014)    ED Course/ Medical Decision Making/ A&P   {   Click here for ABCD2, HEART and other calculatorsREFRESH Note before signing :1}                          Medical Decision Making Amount and/or Complexity of Data Reviewed Labs: ordered. Radiology:  ordered.  Risk Prescription drug management.   ***  {Document critical care time when appropriate:1} {Document review of labs and clinical decision tools ie heart score, Chads2Vasc2 etc:1}  {Document your independent review of radiology images, and any outside records:1} {Document your discussion with family members, caretakers, and with consultants:1} {Document social determinants of health affecting pt's care:1} {Document your decision making why or why not admission, treatments were needed:1} Final Clinical Impression(s) / ED Diagnoses Final diagnoses:  None    Rx / DC Orders ED Discharge Orders     None

## 2022-07-08 NOTE — H&P (Signed)
NAME:  Makay Scheidt, MRN:  [id number], DOB:  02-10-1966, LOS: 0 ADMISSION DATE:  07/12/2022, CONSULTATION DATE:  07/18/2022 REFERRING MD:  Ran[facility] (ED), CHIEF COMPLAINT:  PEA arrest    History of Present Illness:  Arrived to ED after 45 min CPR   ROSC in ED for 10 min Epi infusion started.   Igel had been placed in the field, good sats achieved with this per ED.  Attempted to change with bougie in ED, ended up removing and intubating Additional round of CPR in ED.   Bradycardia on rosc, hypertension.   7.09/90/130 (9pm) VBG --> 7.22/55.4/150 (abg) at 21:13  Cr 1.7 --> 1.5  Alb 2.8  AST 203, ALT 109 Ap 160 bili 0.6  Trop 67 on arrival  Lac 7. 2  WBC 5.5 Plt 145  No covid test done  UDS  foley   In ED: atropine given, started epi, calcium 1gm, bicarb 1 amp (?)  Echo 12/23:  ft ventricular ejection fraction, by estimation, is 30 to 35%.The left ventricle  demonstrates global hypokinesis. The left  ventricular internal cavity size was mildly dilated. Left ventricular  diastolic function could not be evaluated.   2. Right ventricular systolic function is normal. The right ventricular  size is normal. There is normal pulmonary artery systolic pressure. The  estimated right ventricular systolic pressure is [id number] mmHg.   3. Left atrial size was mildly dilated.   4. Notable splay. The mitral valve is abnormal. Moderate to severe mitral  valve regurgitation. There is mild late systolic prolapse of the middle  segment of the anterior leaflet of the mitral valve.   5. The aortic valve is tricuspid. Aortic valve regurgitation is mild.  Aortic valve sclerosis is present, with no evidence of aortic valve  stenosis.  Pertinent  Medical History  A flutter (Cardioversion 2/1) - was sinus brady 48 after SV  Epistaxis  Started entresto 1/16 Started amio 1/16 2024.  Stoppped metop and started coreg   HLD HTN CHF,presumed NICM by cards note  Claudication - plan for aortogram (delayed due to  new low EF)   Home meds:  Amio  Albuterol  Lipitor Coreg Nicoderm  Xarelto Entresto  Diltiazem  Significant Hospital Events: Including procedures, antibiotic start and stop dates in addition to other pertinent events     Interim History / Subjective:  ***  Objective   Blood pressure (!) 270/115, pulse 61, temperature 97.8 F (36.6 C), temperature source Axillary, resp. rate (!) 21, SpO2 100 %.    Vent Mode: PRVC FiO2 (%):  [100 %] 100 % Set Rate:  [20 bmp] 20 bmp Vt Set:  [580 mL] 580 mL PEEP:  [5 cmH20-10 cmH20] 5 cmH20 Plateau Pressure:  [26 cmH20] 26 cmH20  No intake or output data in the 24 hours ending 07/13/2022 2156 There were no vitals filed for this visit.  Examination: General: *** HENT: *** Lungs: *** Cardiovascular: *** Abdomen: *** Extremities: *** Neuro: *** GU: ***  Resolved Hospital Problem list   ***  Assessment & Plan:  ***  Best Practice (right click and "Reselect all SmartList Selections" daily)   Diet/type: {diet [id number] DVT prophylaxis: {anticoagulation (Optional):25687} GI prophylaxis: [id number] Lines: {Central Venous Access:25771} Foley:  {Central Venous Access:25691} Code Status:  {Code Status:26939} Last date of multidisciplinary goals of care discussion [***]  Labs   CBC: Recent Labs  Lab 07/11/2022 2031 07/07/2022 2100 07/19/2022 2101 07/03/2022 2113  WBC 5.5  --   --   --  NEUTROABS PENDING  --   --   --   HGB 13.2 14.6 14.3 14.3  HCT 43.8 43.0 42.0 42.0  MCV 97.6  --   --   --   PLT 145*  --   --   --     Basic Metabolic Panel: Recent Labs  Lab 07/03/2022 2100 07/24/2022 2101 07/04/2022 2113  NA 141 140 138  K 5.1 5.0 6.3*  CL 104  --   --   GLUCOSE 230*  --   --   BUN 13  --   --   CREATININE 1.50*  --   --    GFR: Estimated Creatinine Clearance: 64.3 mL/min (A) (by C-G formula based on SCr of 1.5 mg/dL (H)). Recent Labs  Lab 07/15/2022 2031 07/22/2022 2033  WBC 5.5  --   LATICACIDVEN  --  7.2*    Liver  Function Tests: No results for input(s): "AST", "ALT", "ALKPHOS", "BILITOT", "PROT", "ALBUMIN" in the last 168 hours. No results for input(s): "LIPASE", "AMYLASE" in the last 168 hours. No results for input(s): "AMMONIA" in the last 168 hours.  ABG    Component Value Date/Time   PHART 7.220 (L) 07/09/2022 2113   PCO2ART 55.4 (H) 07/19/2022 2113   PO2ART 150 (H) 07/27/2022 2113   HCO3 22.7 07/06/2022 2113   TCO2 24 06/30/2022 2113   ACIDBASEDEF 6.0 (H) 07/04/2022 2113   O2SAT 99 07/14/2022 2113     Coagulation Profile: No results for input(s): "INR", "PROTIME" in the last 168 hours.  Cardiac Enzymes: No results for input(s): "CKTOTAL", "CKMB", "CKMBINDEX", "TROPONINI" in the last 168 hours.  HbA1C: Hgb A1c MFr Bld  Date/Time Value Ref Range Status  03/08/2022 11:49 AM 6.4 (H) 4.8 - 5.6 % Final    Comment:             Prediabetes: 5.7 - 6.4          Diabetes: >6.4          Glycemic control for adults with diabetes: <7.0   03/20/2020 12:21 PM 5.7 (H) 4.8 - 5.6 % Final    Comment:    (NOTE)         Prediabetes: 5.7 - 6.4         Diabetes: >6.4         Glycemic control for adults with diabetes: <7.0     CBG: Recent Labs  Lab 07/14/2022 2011  GLUCAP 202*    Review of Systems:   ***  Past Medical History:  He,  has a past medical history of Atrial flutter ([place]) (12/2017) and Tobacco abuse.   Surgical History:   Past Surgical History:  Procedure Laterality Date   APPENDECTOMY     CARDIOVERSION N/A 06/30/2022   Procedure: CARDIOVERSION;  Surgeon: Josue Hector, MD;  Location[facility] ENDOSCOPY;  Service: Cardiovascular;  Laterality: N/A;   LIPOMA EXCISION     right shoulder   WISDOM TOOTH EXTRACTION       Social History:   reports that he has been smoking cigarettes. He has a 7.50 pack-year smoking history. He has never used smokeless tobacco. He reports current alcohol use. He reports current drug use. Drug: Marijuana.   Family History:  His family history  includes Coronary artery disease in his father; Diabetes in his mother; Hypertension in his mother.   Allergies No Known Allergies   Home Medications  Prior to Admission medications   Medication Sig Start Date End Date Taking? Authorizing Provider  albuterol (  VENTOLIN HFA) 108 (90 Base) MCG/ACT inhaler Inhale 2 puffs into the lungs every 6 (six) hours as needed for wheezing or shortness of breath. Patient not taking: Reported on 06/14/2022 04/26/22   Martyn Malay, MD  amiodarone (PACERONE) 200 MG tablet Take 2 tablets (400 mg total) TWICE a day for 2 weeks, then take 1 tablet (200 mg total) TWICE a day for 2 weeks, then take 1 tablet (200 mg total) ONCE a day 06/14/22   Camnitz, Ocie Doyne, MD  atorvastatin (LIPITOR) 40 MG tablet Take 1 tablet (40 mg total) by mouth daily. 04/26/22   Martyn Malay, MD  carvedilol (COREG) 25 MG tablet Take 0.5 tablets (12.5 mg total) by mouth 2 (two) times daily. 07/01/22   Josue Hector, MD  cefUROXime (CEFTIN) 500 MG tablet Take 1 tablet (500 mg total) by mouth 2 (two) times daily with a meal. Patient not taking: Reported on 06/14/2022 04/26/22   Martyn Malay, MD  doxycycline (VIBRA-TABS) 100 MG tablet Take 1 tablet (100 mg total) by mouth 2 (two) times daily. 04/26/22   Martyn Malay, MD  nicotine (NICODERM CQ) 7 mg/24hr patch Place 1 patch (7 mg total) onto the skin daily. 04/26/22   Martyn Malay, MD  rivaroxaban (XARELTO) 20 MG TABS tablet Take 1 tablet (20 mg total) by mouth daily with supper. 04/26/22   Martyn Malay, MD  sacubitril-valsartan (ENTRESTO) 24-26 MG Take 1 tablet by mouth 2 (two) times daily. 06/28/22   Camnitz, Ocie Doyne, MD  diltiazem (CARDIZEM CD) 120 MG 24 hr capsule Take 1 capsule (120 mg total) by mouth daily. Patient not taking: Reported on 11/15/2018 02/19/18 11/15/18  Martyn Malay, MD     Critical care time: ***

## 2022-07-09 ENCOUNTER — Inpatient Hospital Stay (HOSPITAL_COMMUNITY): Payer: BLUE CROSS/BLUE SHIELD

## 2022-07-09 DIAGNOSIS — E785 Hyperlipidemia, unspecified: Secondary | ICD-10-CM | POA: Diagnosis present

## 2022-07-09 DIAGNOSIS — R519 Headache, unspecified: Secondary | ICD-10-CM | POA: Diagnosis present

## 2022-07-09 DIAGNOSIS — I469 Cardiac arrest, cause unspecified: Principal | ICD-10-CM | POA: Diagnosis present

## 2022-07-09 DIAGNOSIS — I110 Hypertensive heart disease with heart failure: Secondary | ICD-10-CM | POA: Diagnosis present

## 2022-07-09 DIAGNOSIS — R5383 Other fatigue: Secondary | ICD-10-CM | POA: Diagnosis present

## 2022-07-09 DIAGNOSIS — Z79899 Other long term (current) drug therapy: Secondary | ICD-10-CM | POA: Diagnosis not present

## 2022-07-09 DIAGNOSIS — R7989 Other specified abnormal findings of blood chemistry: Secondary | ICD-10-CM | POA: Diagnosis present

## 2022-07-09 DIAGNOSIS — E1151 Type 2 diabetes mellitus with diabetic peripheral angiopathy without gangrene: Secondary | ICD-10-CM | POA: Diagnosis present

## 2022-07-09 DIAGNOSIS — I4892 Unspecified atrial flutter: Secondary | ICD-10-CM | POA: Diagnosis present

## 2022-07-09 DIAGNOSIS — G931 Anoxic brain damage, not elsewhere classified: Secondary | ICD-10-CM | POA: Diagnosis present

## 2022-07-09 DIAGNOSIS — I340 Nonrheumatic mitral (valve) insufficiency: Secondary | ICD-10-CM | POA: Diagnosis present

## 2022-07-09 DIAGNOSIS — I959 Hypotension, unspecified: Secondary | ICD-10-CM

## 2022-07-09 DIAGNOSIS — Z515 Encounter for palliative care: Secondary | ICD-10-CM | POA: Diagnosis not present

## 2022-07-09 DIAGNOSIS — Z8249 Family history of ischemic heart disease and other diseases of the circulatory system: Secondary | ICD-10-CM | POA: Diagnosis not present

## 2022-07-09 DIAGNOSIS — I428 Other cardiomyopathies: Secondary | ICD-10-CM | POA: Diagnosis present

## 2022-07-09 DIAGNOSIS — Z7901 Long term (current) use of anticoagulants: Secondary | ICD-10-CM | POA: Diagnosis not present

## 2022-07-09 DIAGNOSIS — N179 Acute kidney failure, unspecified: Secondary | ICD-10-CM | POA: Diagnosis not present

## 2022-07-09 DIAGNOSIS — Z1152 Encounter for screening for COVID-19: Secondary | ICD-10-CM | POA: Diagnosis not present

## 2022-07-09 DIAGNOSIS — R001 Bradycardia, unspecified: Secondary | ICD-10-CM | POA: Diagnosis present

## 2022-07-09 DIAGNOSIS — R4182 Altered mental status, unspecified: Secondary | ICD-10-CM | POA: Diagnosis not present

## 2022-07-09 DIAGNOSIS — Z66 Do not resuscitate: Secondary | ICD-10-CM | POA: Diagnosis not present

## 2022-07-09 DIAGNOSIS — F17210 Nicotine dependence, cigarettes, uncomplicated: Secondary | ICD-10-CM | POA: Diagnosis present

## 2022-07-09 DIAGNOSIS — E872 Acidosis, unspecified: Secondary | ICD-10-CM | POA: Diagnosis present

## 2022-07-09 DIAGNOSIS — I161 Hypertensive emergency: Secondary | ICD-10-CM | POA: Diagnosis present

## 2022-07-09 DIAGNOSIS — R7401 Elevation of levels of liver transaminase levels: Secondary | ICD-10-CM | POA: Diagnosis present

## 2022-07-09 DIAGNOSIS — I5022 Chronic systolic (congestive) heart failure: Secondary | ICD-10-CM | POA: Diagnosis present

## 2022-07-09 DIAGNOSIS — I462 Cardiac arrest due to underlying cardiac condition: Secondary | ICD-10-CM | POA: Diagnosis present

## 2022-07-09 LAB — POCT I-STAT 7, (LYTES, BLD GAS, ICA,H+H)
Acid-base deficit: 8 mmol/L — ABNORMAL HIGH (ref 0.0–2.0)
Bicarbonate: 18.2 mmol/L — ABNORMAL LOW (ref 20.0–28.0)
Calcium, Ion: 1.31 mmol/L (ref 1.15–1.40)
HCT: 46 % (ref 39.0–52.0)
Hemoglobin: 15.6 g/dL (ref 13.0–17.0)
O2 Saturation: 100 %
Patient temperature: 36
Potassium: 4.7 mmol/L (ref 3.5–5.1)
Sodium: 138 mmol/L (ref 135–145)
TCO2: 19 mmol/L — ABNORMAL LOW (ref 22–32)
pCO2 arterial: 36.8 mmHg (ref 32–48)
pH, Arterial: 7.296 — ABNORMAL LOW (ref 7.35–7.45)
pO2, Arterial: 371 mmHg — ABNORMAL HIGH (ref 83–108)

## 2022-07-09 LAB — PROTIME-INR
INR: 1.3 — ABNORMAL HIGH (ref 0.8–1.2)
Prothrombin Time: 16.4 seconds — ABNORMAL HIGH (ref 11.4–15.2)

## 2022-07-09 LAB — RAPID URINE DRUG SCREEN, HOSP PERFORMED
Amphetamines: NOT DETECTED
Barbiturates: NOT DETECTED
Benzodiazepines: NOT DETECTED
Cocaine: POSITIVE — AB
Opiates: NOT DETECTED
Tetrahydrocannabinol: POSITIVE — AB

## 2022-07-09 LAB — RESP PANEL BY RT-PCR (RSV, FLU A&B, COVID)  RVPGX2
Influenza A by PCR: NEGATIVE
Influenza B by PCR: NEGATIVE
Resp Syncytial Virus by PCR: NEGATIVE
SARS Coronavirus 2 by RT PCR: NEGATIVE

## 2022-07-09 LAB — LACTIC ACID, PLASMA: Lactic Acid, Venous: 3.9 mmol/L (ref 0.5–1.9)

## 2022-07-09 LAB — STREP PNEUMONIAE URINARY ANTIGEN: Strep Pneumo Urinary Antigen: NEGATIVE

## 2022-07-09 LAB — URINALYSIS, ROUTINE W REFLEX MICROSCOPIC
Bilirubin Urine: NEGATIVE
Glucose, UA: 150 mg/dL — AB
Ketones, ur: NEGATIVE mg/dL
Leukocytes,Ua: NEGATIVE
Nitrite: NEGATIVE
Protein, ur: 100 mg/dL — AB
Specific Gravity, Urine: 1.012 (ref 1.005–1.030)
pH: 7 (ref 5.0–8.0)

## 2022-07-09 LAB — TROPONIN I (HIGH SENSITIVITY): Troponin I (High Sensitivity): 580 ng/L (ref <18)

## 2022-07-09 LAB — MAGNESIUM
Magnesium: 1.8 mg/dL (ref 1.7–2.4)
Magnesium: 2.7 mg/dL — ABNORMAL HIGH (ref 1.7–2.4)
Magnesium: 2.6 mg/dL — ABNORMAL HIGH (ref 1.7–2.4)

## 2022-07-09 LAB — PHOSPHORUS
Phosphorus: 2.6 mg/dL (ref 2.5–4.6)
Phosphorus: 2.8 mg/dL (ref 2.5–4.6)

## 2022-07-09 LAB — HIV ANTIBODY (ROUTINE TESTING W REFLEX): HIV Screen 4th Generation wRfx: NONREACTIVE

## 2022-07-09 LAB — GLUCOSE, CAPILLARY
Glucose-Capillary: 143 mg/dL — ABNORMAL HIGH (ref 70–99)
Glucose-Capillary: 144 mg/dL — ABNORMAL HIGH (ref 70–99)
Glucose-Capillary: 97 mg/dL (ref 70–99)

## 2022-07-09 LAB — BRAIN NATRIURETIC PEPTIDE: B Natriuretic Peptide: 3014 pg/mL — ABNORMAL HIGH (ref 0.0–100.0)

## 2022-07-09 LAB — TSH: TSH: 2.531 u[IU]/mL (ref 0.350–4.500)

## 2022-07-09 MED ORDER — FENTANYL CITRATE PF 50 MCG/ML IJ SOSY
100.0000 ug | PREFILLED_SYRINGE | Freq: Once | INTRAMUSCULAR | Status: AC
  Administered 2022-07-09: 100 ug via INTRAVENOUS

## 2022-07-09 MED ORDER — MIDAZOLAM HCL 2 MG/2ML IJ SOLN: 5.0000 mg | Freq: Once | INTRAMUSCULAR | Status: DC

## 2022-07-09 MED ORDER — DOCUSATE SODIUM 50 MG/5ML PO LIQD: 100.0000 mg | Freq: Two times a day (BID) | ORAL | Status: DC | PRN

## 2022-07-09 MED ORDER — CHLORHEXIDINE GLUCONATE CLOTH 2 % EX PADS
6.0000 | MEDICATED_PAD | Freq: Every day | CUTANEOUS | Status: DC
  Administered 2022-07-09: 6 via TOPICAL

## 2022-07-09 MED ORDER — FENTANYL CITRATE PF 50 MCG/ML IJ SOSY
PREFILLED_SYRINGE | INTRAMUSCULAR | Status: AC
  Filled 2022-07-09: qty 2

## 2022-07-09 MED ORDER — SODIUM CHLORIDE 0.9 % IV SOLN: INTRAVENOUS | Status: DC | PRN

## 2022-07-09 MED ORDER — MIDAZOLAM HCL 2 MG/2ML IJ SOLN
INTRAMUSCULAR | Status: AC
  Administered 2022-07-09: 6 mg via INTRAVENOUS
  Filled 2022-07-09: qty 6

## 2022-07-09 MED ORDER — VITAL HIGH PROTEIN PO LIQD
1000.0000 mL | ORAL | Status: DC
  Administered 2022-07-09: 1000 mL

## 2022-07-09 MED ORDER — ORAL CARE MOUTH RINSE
15.0000 mL | OROMUCOSAL | Status: DC
  Administered 2022-07-09 – 2022-07-10 (×13): 15 mL via OROMUCOSAL

## 2022-07-09 MED ORDER — ORAL CARE MOUTH RINSE: 15.0000 mL | OROMUCOSAL | Status: DC | PRN

## 2022-07-09 MED ORDER — ENOXAPARIN SODIUM 100 MG/ML IJ SOSY
100.0000 mg | PREFILLED_SYRINGE | Freq: Two times a day (BID) | INTRAMUSCULAR | Status: DC
  Administered 2022-07-09: 100 mg via SUBCUTANEOUS
  Filled 2022-07-09 (×2): qty 1

## 2022-07-09 MED ORDER — MIDAZOLAM HCL 2 MG/2ML IJ SOLN: 6.0000 mg | Freq: Once | INTRAMUSCULAR | Status: AC

## 2022-07-09 MED ORDER — PROSOURCE TF20 ENFIT COMPATIBL EN LIQD
60.0000 mL | Freq: Every day | ENTERAL | Status: DC
  Administered 2022-07-09: 60 mL
  Filled 2022-07-09: qty 60

## 2022-07-09 MED ORDER — ENOXAPARIN SODIUM 100 MG/ML IJ SOSY
100.0000 mg | PREFILLED_SYRINGE | Freq: Once | INTRAMUSCULAR | Status: AC
  Administered 2022-07-09: 100 mg via SUBCUTANEOUS
  Filled 2022-07-09: qty 1

## 2022-07-09 MED ORDER — MAGNESIUM SULFATE 2 GM/50ML IV SOLN
2.0000 g | Freq: Once | INTRAVENOUS | Status: AC
  Administered 2022-07-09: 2 g via INTRAVENOUS
  Filled 2022-07-09: qty 50

## 2022-07-09 MED ORDER — DOPAMINE-DEXTROSE 3.2-5 MG/ML-% IV SOLN
7.5000 ug/kg/min | INTRAVENOUS | Status: DC
  Administered 2022-07-09 (×2): 5 ug/kg/min via INTRAVENOUS

## 2022-07-09 MED ORDER — HEPARIN SODIUM (PORCINE) 5000 UNIT/ML IJ SOLN
5000.0000 [IU] | Freq: Three times a day (TID) | INTRAMUSCULAR | Status: DC
  Filled 2022-07-09: qty 1

## 2022-07-09 MED ORDER — FAMOTIDINE 20 MG PO TABS
20.0000 mg | ORAL_TABLET | Freq: Two times a day (BID) | ORAL | Status: DC
  Administered 2022-07-09 (×2): 20 mg
  Filled 2022-07-09 (×2): qty 1

## 2022-07-09 MED ORDER — DOPAMINE-DEXTROSE 3.2-5 MG/ML-% IV SOLN
INTRAVENOUS | Status: AC
  Filled 2022-07-09: qty 250

## 2022-07-09 MED ORDER — MIDAZOLAM HCL 2 MG/2ML IJ SOLN: 2.0000 mg | INTRAMUSCULAR | Status: DC | PRN

## 2022-07-09 MED ORDER — POLYETHYLENE GLYCOL 3350 17 G PO PACK: 17.0000 g | PACK | Freq: Every day | ORAL | Status: DC | PRN

## 2022-07-09 NOTE — Procedures (Addendum)
Patient Name: Kevin Mora  MRN: [id number]  Epilepsy Attending: Lora Havens  Referring Physician/Provider: Lorenza Chick, MD  Duration: 07/09/2022 0149 to 08/05/22 0149  Patient history: 57yo M s/p cardiac arrest. EEG to evaluate for seizure  Level of alertness:  comatose  AEDs during EEG study: Propofol  Technical aspects: This EEG study was done with scalp electrodes positioned according to the 10-20 International system of electrode placement. Electrical activity was reviewed with band pass filter of 1-70Hz$ , sensitivity of 7 uV/mm, display speed of 26m/sec with a 60Hz$  notched filter applied as appropriate. EEG data were recorded continuously and digitally stored.  Video monitoring was available and reviewed as appropriate.  Description: EEG showed continuous generalized background suppression, not reactive to tactile stimulation. Hyperventilation and photic stimulation were not performed.     ABNORMALITY - Background suppression, generalized  IMPRESSION: This study is suggestive of profound diffuse encephalopathy, nonspecific etiology but in the setting of cardiac arrest could be due to anoxic/hypoxic brain injury. No seizures or epileptiform discharges were seen throughout the recording.  [name] [name]

## 2022-07-09 NOTE — Progress Notes (Signed)
Patient not available for EEG at the moment moving to ICU per nurse

## 2022-07-09 NOTE — H&P (Signed)
NAME:  Kevin Mora, MRN:  [id number], DOB:  Mar 16, 1966, LOS: 0 ADMISSION DATE:  07/23/2022, CONSULTATION DATE:  07/17/2022 REFERRING MD:  Nicole Kindred (ED), CHIEF COMPLAINT:  PEA arrest    History of Present Illness:  57 yo man with hx of chf, aflutter, PAD, recent hx of fatigue, nausea for a few days.  Was doing fine, cooking, went upstairs.   Family found him about 10 min later unconscious. Called 911, 5 min passed before ems arrived,  PEA noted on arrival, started CPR.    Arrived to ED after 45 min CPR.  Had achieved rosc at some point for a period.  Was again undergoing CPR on arrival.    ROSC in ED for 10 min Epi infusion started.  Pt bradycardic at the time.   Igel had been placed in the field, good sats achieved with this per ED.  Attempted to change with bougie in ED, ended up removing and intubating Additional round of CPR in ED.   Bradycardia on rosc, hypertension.   Pertinent  Medical History  A flutter (Cardioversion 2/1) - was sinus brady 48 after SV  Epistaxis  Started entresto 1/16 Started amio 1/16 2024.  Stoppped metop and started coreg   HLD HTN CHF presumed NICM by cards note  Claudication - plan for aortogram (delayed due to new low EF)   Home meds:  Amio  Albuterol  Lipitor Coreg Nicoderm  Xarelto Entresto  Diltiazem  Significant Hospital Events: Including procedures, antibiotic start and stop dates in addition to other pertinent events   2/9 admitted post arrest   Interim History / Subjective:    Objective   Blood pressure 116/84, pulse (!) 51, temperature 98.6 F (37 C), temperature source Esophageal, resp. rate (!) 23, height 5' 10"$  (1.778 m), weight 95 kg, SpO2 96 %.    Vent Mode: PRVC FiO2 (%):  [40 %-100 %] 40 % Set Rate:  [20 bmp] 20 bmp Vt Set:  [580 mL] 580 mL PEEP:  [5 cmH20-10 cmH20] 5 cmH20 Plateau Pressure:  [24 cmH20-27 cmH20] 24 cmH20   Intake/Output Summary (Last 24 hours) at 07/09/2022 1120 Last data filed at 07/09/2022 1116 Gross  per 24 hour  Intake 1940.81 ml  Output 350 ml  Net 1590.81 ml   Filed Weights   07/09/22 0155  Weight: 95 kg    Examination: General: non responsive, well nourished.,  HENT: conjunctival erythema, orally intubated.  Lungs: ctab  Cardiovascular: sinus brady HS normal Abdomen: distended, nbs  Extremities: no edema  Neuro: non responsive to pain, breathing over vent rate.   GU: clear urine in foley.  Ancillary tests personally reviewed:   Creatinine 1.50 Mild transaminase elevation.  Mild troponin elevation.  Plt 145 GGO on CT consistent with aspiration vs atypical CHF  Assessment & Plan:   Critically ill due to PEA cardiac arrest of unclear etiology requiring mechanical ventilation.   HFrEF with EF 30%, severe MR and atrial flutter (tachycardia mediated) Bradycardia initially requiring epinephrine infusion (medication-induced arrest?)  Atrial flutter on amiodarone - currently SB Hypoxic ischemic encephalopathy. DM HTN  HLD   Plan:   - Currently hypertensive with sinus bradycardia. Manage expectantly - TTM normothermia, EEG monitoring, keep off sedation for and neuroprognostication at 96h. - Repeat echocardiogram - Hold amiodarone, coreg - Resume heparin IV given CVA risk with recent cardioversion.  - Follow creatinine and consider re-starting Entresto  - SSI  - Titrate Cleviprex to keep SBP 110-150. Consider restarting oral tomorrow.  - Full ventilator  support, start SBT in am   Best Practice (right click and "Reselect all SmartList Selections" daily)   Diet/type: start tube feeds.  DVT prophylaxis: heparin IV GI prophylaxis: PPI Lines: N/A Foley:  N/A Code Status:  full code Last date of multidisciplinary goals of care discussion [mother updated 2/10 - told we need to give 96h prior to neuroprognostication.]  CRITICAL CARE Performed by: Kipp Brood   Total critical care time: 40 minutes  Critical care time was exclusive of separately billable  procedures and treating other patients.  Critical care was necessary to treat or prevent imminent or life-threatening deterioration.  Critical care was time spent personally by me on the following activities: development of treatment plan with patient and/or surrogate as well as nursing, discussions with consultants, evaluation of patient's response to treatment, examination of patient, obtaining history from patient or surrogate, ordering and performing treatments and interventions, ordering and review of laboratory studies, ordering and review of radiographic studies, pulse oximetry, re-evaluation of patient's condition and participation in multidisciplinary rounds.  Kipp Brood, MD [facility] ICU Physician [place]  Pager: [phone] Mobile: [phone] After hours: [phone].

## 2022-07-09 NOTE — Progress Notes (Signed)
Pt transported to 2H26 on vent. No complications noted

## 2022-07-09 NOTE — Progress Notes (Addendum)
[place] Progress Note Patient Name: Kix Greenhagen DOB: 21-May-1966 MRN: [id number]   Date of Service  07/09/2022  HPI/Events of Note  Patient presents from the emergency department with bradycardia, hypertensive emergency, minimally responsive.  Underwent CT of the head, chest abdomen pelvis.  Upon arrival, the patient has been persistently hypertensive  eICU Interventions  Resume patient's cleviprex, added Versed and fentanyl x 1 dose.  Patiens aline went bad, not reading -removed.  Fortunately, cuff is reading appropriately.  Appropriate access for now, running all meds through PIV's.  Unfortunately, patient is bradycardic likely secondary to hypothermia.  Maintain normothermia/avoid fevers.    0340 - Patient evaluated by NSG at bedside and noted to be gurgling on secretions, requesting intubation.  Currently notified and intubated the patient.  CT head reviewed-significant worsening of intracranial hemorrhage and intraventricular hemorrhage.  After flushing the EVD, there is now drainage.  0428 - ABG reviewed, amenable to O2 titration.  Intervention Category Intermediate Interventions: Arrhythmia - evaluation and management Evaluation Type: New Patient Evaluation  Kierre Hintz 07/09/2022, 1:25 AM

## 2022-07-09 NOTE — Progress Notes (Signed)
Sustained HR 30s.

## 2022-07-09 NOTE — Procedures (Signed)
Arterial Catheter Insertion Procedure Note  Kevin Mora  [id number]  06/25/1965  Date:07/09/22  Time:11:50 PM    Provider Performing: Collier Bullock    Procedure: Insertion of Arterial Line [phone]) without US guidance  Indication(s) Blood pressure monitoring and/or need for frequent ABGs  Consent Unable to obtain consent due to emergent nature of procedure.  Anesthesia None   Time Out Verified patient identification, verified procedure, site/side was marked, verified correct patient position, special equipment/implants available, medications/allergies/relevant history reviewed, required imaging and test results available.   Sterile Technique Maximal sterile technique including full sterile barrier drape, hand hygiene, sterile gown, sterile gloves, mask, hair covering, sterile ultrasound probe cover (if used).   Procedure Description Area of catheter insertion was cleaned with chlorhexidine and draped in sterile fashion. Without real-time ultrasound guidance an arterial catheter was placed into the right radial artery.  Appropriate arterial tracings confirmed on monitor.   2 attempts   Complications/Tolerance None; patient tolerated the procedure well.   EBL Minimal   Specimen(s) None

## 2022-07-09 NOTE — Progress Notes (Signed)
ANTICOAGULATION CONSULT NOTE  Pharmacy Consult for enoxaparin Indication: atrial fibrillation  No Known Allergies  Patient Measurements: Height: 5' 10"$  (177.8 cm) Weight: 95 kg (209 lb 7 oz) IBW/kg (Calculated) : 73   Vital Signs: Temp: 98.6 F (37 C) (02/10 1111) Temp Source: Esophageal (02/10 1111) BP: 116/84 (02/10 1100) Pulse Rate: 51 (02/10 1100)  Labs: Recent Labs    07/14/2022 2031 07/15/2022 2100 07/26/2022 2101 07/20/2022 2113 07/07/2022 2327 07/09/22 0333  HGB 13.2 14.6 14.3 14.3  --  15.6  HCT 43.8 43.0 42.0 42.0  --  46.0  PLT 145*  --   --   --   --   --   LABPROT  --   --   --   --  16.4*  --   INR  --   --   --   --  1.3*  --   CREATININE 1.70* 1.50*  --   --   --   --   TROPONINIHS 67*  --   --   --  580*  --     Estimated Creatinine Clearance: 65.1 mL/min (A) (by C-G formula based on SCr of 1.5 mg/dL (H)).   Medical History: Past Medical History:  Diagnosis Date   Atrial flutter ([place]) 12/2017   Tobacco abuse      Assessment: 33 yoM on Xarelto PTA for hx AF with recent DCCV 2/1. Pt admitted s/p PEA arrest, pharmacy to dose enoxaparin for anticoagulation. Cr up slightly from baseline, CBC stable.  Goal of Therapy:  Anti-Xa level 0.6-1 units/ml 4hrs after LMWH dose given Monitor platelets by anticoagulation protocol: Yes   Plan:  Enoxaparin 16m/kg SQ BID Watch Cr  MArrie Senate PharmD, BMayo BMercy HospitalClinical Pharmacist [phone] check AMION for all MSystem Optics IncPharmacy numbers 07/09/2022

## 2022-07-09 NOTE — Consult Note (Signed)
Cardiology Consultation   Patient ID: Kevin Mora MRN: [id number]; DOB: Apr 22, 1966  Admit date: 07/12/2022 Date of Consult: 07/09/2022  PCP:  Martyn Malay, [place] Prov[facility] Cardiologist:  None  Electrophysiologist:  Will Meredith Leeds, MD       Patient Profile:   Kevin Mora is a 57 y.o. male with a hx of chronic systolic heart failure, polysubstance abuse, HTN, tobacco use, and atrial flutter s/p DCCV (06/30/22) who is being seen 07/09/2022 for the evaluation of bradycardia in the setting of PEA arrest.    History of Present Illness:    History limited given patient's clinical status and obtained primarily from ED records and the patient's mother at the bedside. Patient's mother states that he had been overall well over the past several days, except for headaches and nausea that he had experiencing. He was treating his nausea with some of his mother's prescription zofran but it did not seem that his life was overtly affected by these symptoms.    On the evening of his arrest, he was marinating meat and preparing to cook at home where he lives with his mother. According to the patient's mother the patient left the room and said that he would be back, but when he did not return for twenty minutes she sent family to check on him and he was found down and unconscious. 911 was called and emergency services arrived within 5 minutes. His initial rhythm was PEA and CPR was initiated.   He arrived to [facility] ED after undergoing about 45 minutes of CPR with intermittent ROSC and receiving about 6 rounds of epinephrine. Upon arrival in the ED, he was again undergoing CPR with code doses of epi, along with unclear dosages of calcium, bicarb. ROSC was obtained and patient was found to be bradycardic to the 30s. An epinephrine infusion was subsequently started, and while his HRs improved, they never went about 60 bpm. Several critical labs resulted consistent with cardiac arrest  with CPR and ROSC. Epinephrine was subsequently stopped and he has remained stable from a HR standpoint above 50 bpm.    Past Medical History:  Diagnosis Date   Atrial flutter ([place]) 12/2017   Tobacco abuse     Past Surgical History:  Procedure Laterality Date   APPENDECTOMY     CARDIOVERSION N/A 06/30/2022   Procedure: CARDIOVERSION;  Surgeon: Josue Hector, MD;  Location: [facility] ENDOSCOPY;  Service: Cardiovascular;  Laterality: N/A;   LIPOMA EXCISION     right shoulder   WISDOM TOOTH EXTRACTION       Home Medications:  Prior to Admission medications   Medication Sig Start Date End Date Taking? Authorizing Provider  albuterol (VENTOLIN HFA) 108 (90 Base) MCG/ACT inhaler Inhale 2 puffs into the lungs every 6 (six) hours as needed for wheezing or shortness of breath. Patient not taking: Reported on 06/14/2022 04/26/22   Martyn Malay, MD  amiodarone (PACERONE) 200 MG tablet Take 2 tablets (400 mg total) TWICE a day for 2 weeks, then take 1 tablet (200 mg total) TWICE a day for 2 weeks, then take 1 tablet (200 mg total) ONCE a day 06/14/22   Camnitz, Ocie Doyne, MD  atorvastatin (LIPITOR) 40 MG tablet Take 1 tablet (40 mg total) by mouth daily. 04/26/22   Martyn Malay, MD  carvedilol (COREG) 25 MG tablet Take 0.5 tablets (12.5 mg total) by mouth 2 (two) times daily. 07/01/22   Josue Hector, MD  cefUROXime ([place])  500 MG tablet Take 1 tablet (500 mg total) by mouth 2 (two) times daily with a meal. Patient not taking: Reported on 06/14/2022 04/26/22   Martyn Malay, MD  doxycycline (VIBRA-TABS) 100 MG tablet Take 1 tablet (100 mg total) by mouth 2 (two) times daily. 04/26/22   Martyn Malay, MD  nicotine (NICODERM CQ) 7 mg/24hr patch Place 1 patch (7 mg total) onto the skin daily. 04/26/22   Martyn Malay, MD  rivaroxaban (XARELTO) 20 MG TABS tablet Take 1 tablet (20 mg total) by mouth daily with supper. 04/26/22   Martyn Malay, MD  sacubitril-valsartan (ENTRESTO) 24-26 MG Take  1 tablet by mouth 2 (two) times daily. 06/28/22   Camnitz, Ocie Doyne, MD  diltiazem (CARDIZEM CD) 120 MG 24 hr capsule Take 1 capsule (120 mg total) by mouth daily. Patient not taking: Reported on 11/15/2018 02/19/18 11/15/18  Martyn Malay, MD    Inpatient Medications: Scheduled Meds:  famotidine  20 mg Per Tube BID   fentaNYL (SUBLIMAZE) injection  100 mcg Intravenous Once   midazolam  5 mg Intravenous Once   Continuous Infusions:  sodium chloride 1,000 mL (07/20/2022 2104)   sodium chloride     clevidipine 2 mg/hr (07/09/22 0043)   epinephrine Stopped (07/22/2022 2304)   propofol (DIPRIVAN) infusion Stopped (07/09/22 0046)   PRN Meds: Place/Maintain arterial line **AND** sodium chloride, docusate, midazolam, polyethylene glycol  Allergies:   No Known Allergies  Social History:   Social History   Socioeconomic History   Marital status: Single    Spouse name: Not on file   Number of children: Not on file   Years of education: Not on file   Highest education level: Not on file  Occupational History   Not on file  Tobacco Use   Smoking status: Every Day    Packs/day: 0.25    Years: 30.00    Total pack years: 7.50    Types: Cigarettes   Smokeless tobacco: Never  Vaping Use   Vaping Use: Never used  Substance and Sexual Activity   Alcohol use: Yes    Comment: Socially    Drug use: Yes    Types: Marijuana   Sexual activity: Yes    Partners: Male  Other Topics Concern   Not on file  Social History Narrative   Previously worked in Psych[employer], educational now just working intermittently.  He lives with his mother.  He reports his other 2 siblings have passed away.   Social Determinants of Health   Financial Resource Strain: Medium Risk (01/10/2018)   Overall Financial Resource Strain (CARDIA)    Difficulty of Paying Living Expenses: Somewhat hard  Food Insecurity: Food Insecurity Present (01/10/2018)   Hunger Vital Sign    Worried About Running Out of Food in the Last Year:  Sometimes true    Ran Out of Food in the Last Year: Sometimes true  Transportation Needs: Unmet Transportation Needs (01/10/2018)   PRAPARE - Hydrologist (Medical): Yes    Lack of Transportation (Non-Medical): Yes  Physical Activity: Sufficiently Active (01/10/2018)   Exercise Vital Sign    Days of Exercise per Week: 7 days    Minutes of Exercise per Session: 60 min  Stress: No Stress Concern Present (01/10/2018)   [place]    Feeling of Stress : Not at all  Social Connections: Unknown (01/10/2018)   Social Connection and Isolation Panel [NHANES]  Frequency of Communication with Friends and Family: More than three times a week    Frequency of Social Gatherings with Friends and Family: Once a week    Attends Religious Services: More than 4 times per year    Active Member of [employer] or Organizations: No    Attends Archivist Meetings: Never    Marital Status: Patient refused  Intimate Partner Violence: Not At Risk (01/10/2018)   Humiliation, Afraid, Rape, and Kick questionnaire    Fear of Current or Ex-Partner: No    Emotionally Abused: No    Physically Abused: No    Sexually Abused: No    Family History:    Family History  Problem Relation Age of Onset   Diabetes Mother    Hypertension Mother    Coronary artery disease Father      ROS:  Please see the history of present illness.   All other ROS reviewed and negative.     Physical Exam/Data:   Vitals:   07/19/2022 2340 07/03/2022 2345 07/24/2022 2358 07/09/22 0010  BP: (!) 246/110 (!) 243/111  (!) 239/109  Pulse: (!) 48 (!) 49 (!) 49 (!) 47  Resp: 17 19 19 18  $ Temp:      TempSrc:      SpO2: 99% 99% 99% 99%   No intake or output data in the 24 hours ending 07/09/22 0120    06/30/2022   10:19 AM 06/14/2022   12:02 PM 05/31/2022    1:30 PM  Last 3 Weights  Weight (lbs) 204 lb 204 lb 200 lb  Weight (kg) 92.534 kg 92.534 kg  90.719 kg     There is no height or weight on file to calculate BMI.  General:  Unresponsive on ventilator, blown pupils bilaterally Cardiac:  normal S1, S2; brady; regular rhythm; no murmur/rubs/gallops Lungs:  clear to auscultation anteriorly Abd: soft, mildly distended Ext: no edema, cold extremities Neuro: unresponsive   Laboratory Data:  High Sensitivity Troponin:   Recent Labs  Lab 07/17/2022 2031 07/09/2022 2327  TROPONINIHS 67* 580*     Chemistry Recent Labs  Lab 07/07/2022 2031 07/25/2022 2100 07/04/2022 2101 07/11/2022 2113  NA 139 141 140 138  K 5.3* 5.1 5.0 6.3*  CL 107 104  --   --   CO2 18*  --   --   --   GLUCOSE 244* 230*  --   --   BUN 11 13  --   --   CREATININE 1.70* 1.50*  --   --   CALCIUM 8.4*  --   --   --   GFRNONAA 47*  --   --   --   ANIONGAP 14  --   --   --     Recent Labs  Lab 07/07/2022 2031  PROT 5.7*  ALBUMIN 2.8*  AST 203*  ALT 109*  ALKPHOS 160*  BILITOT 0.6   Lipids No results for input(s): "CHOL", "TRIG", "HDL", "LABVLDL", "LDLCALC", "CHOLHDL" in the last 168 hours.  Hematology Recent Labs  Lab 07/04/2022 2031 07/26/2022 2100 07/22/2022 2101 07/26/2022 2113  WBC 5.5  --   --   --   RBC 4.49  --   --   --   HGB 13.2 14.6 14.3 14.3  HCT 43.8 43.0 42.0 42.0  MCV 97.6  --   --   --   MCH 29.4  --   --   --   MCHC 30.1  --   --   --  RDW 15.3  --   --   --   PLT 145*  --   --   --    Thyroid  Recent Labs  Lab 06/30/2022 2327  TSH 2.531    BNPNo results for input(s): "BNP", "PROBNP" in the last 168 hours.  DDimer No results for input(s): "DDIMER" in the last 168 hours.   Radiology/Studies:  CT Angio Head W or Wo Contrast  Result Date: 07/07/2022 CLINICAL DATA:  Initial evaluation for headache.  History of CPR. EXAM: CT ANGIOGRAPHY HEAD TECHNIQUE: Multidetector CT imaging of the head was performed using the standard protocol during bolus administration of intravenous contrast. Multiplanar CT image reconstructions and MIPs were  obtained to evaluate the vascular anatomy. RADIATION DOSE REDUCTION: This exam was performed according to the departmental dose-optimization program which includes automated exposure control, adjustment of the mA and/or kV according to patient size and/or use of iterative reconstruction technique. CONTRAST:  42m OMNIPAQUE IOHEXOL 350 MG/ML SOLN COMPARISON:  Prior study from 01/09/2018. FINDINGS: CT HEAD Brain: Examination technically limited by motion and scattered streak artifact. Cerebral volume within normal limits. Small remote lacunar infarct present at the anterior right basal ganglia. No acute intracranial hemorrhage. No visible acute large vessel territory infarct. There is loss of cortical sulcation as compared to previous exam, which could reflect early changes of anoxia with cerebral edema given history of CPR. No mass lesion, midline shift or of or significant mass effect. No hydrocephalus or extra-axial fluid collection. Vascular: No visible abnormal hyperdense vessel. Skull: Scalp soft tissues demonstrate no acute finding. Calvarium intact. Sinuses: Globes orbital soft tissues demonstrate no acute finding. Moderate mucosal thickening present about the sphenoethmoidal and maxillary sinuses. Mastoid air cells are largely clear. Patient is intubated. Other: Evaluation of the intracranial circulation limited by motion. Visualized distal cervical segments of both internal carotid arteries are patent. 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 On the right, there is focal moderate stenosis involving the cavernous right ICA (series 8, image 65). Right A1 segment patent. Left A1 hypoplastic and/or absent. Normal anterior communicating artery complex. Anterior cerebral arteries patent without visible stenosis. No M1 stenosis or occlusion. No proximal MCA branch occlusion or high-grade stenosis.  Distal MCA branches perfused and symmetric. CTA HEAD Anterior circulation: Posterior circulation: Both V4 segments patent without stenosis. Left vertebral artery slightly dominant. Both PICA patent. Basilar patent to its distal aspect without visible stenosis. Superior cerebellar arteries patent bilaterally. Both PCAs primarily supplied via the basilar. Short-segment moderate left P2 stenosis noted (series 13, image 13). PCAs otherwise patent to their distal aspects. Venous sinuses: Grossly patent allowing for timing the contrast bolus. Anatomic variants: As above.  No aneurysm. Review of the MIP images confirms the above findings. IMPRESSION: CT HEAD IMPRESSION: 1. Motion degraded exam. 2. Question loss of cortical sulcation, which could reflect early changes of anoxia with cerebral edema given history of CPR. Short interval follow-up CT and/or MRI could be performed for further evaluation as warranted. 3. No other acute intracranial abnormality. 4. 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 CTA HEAD AND NECK IMPRESSION: 1. Negative CTA for large vessel occlusion or other emergent finding. 2. 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 3. Focal moderate stenosis involving the cavernous right ICA, with additional short-segment moderate left P2 stenosis. Electronically Signed   By: BMarland Kitchen  Jeannine Boga M.D.   On: 07/16/2022 22:42   CT Angio Chest PE W and/or Wo Contrast  Result Date: 07/15/2022 CLINICAL DATA:  Pulmonary embolism suspected blunt abdominal trauma patient received 45 minutes of CPR EXAM: CT ANGIOGRAPHY CHEST CT ABDOMEN AND PELVIS WITH CONTRAST TECHNIQUE: Multidetector CT imaging of the chest was performed using the standard protocol during bolus administration of intravenous contrast. Multiplanar CT image reconstructions and MIPs were obtained to evaluate the vascular anatomy. Multidetector CT imaging of  the abdomen and pelvis was performed using the standard protocol during bolus administration of intravenous contrast. RADIATION DOSE REDUCTION: This exam was performed according to the departmental dose-optimization program which includes automated exposure control, adjustment of the mA and/or kV according to patient size and/or use of iterative reconstruction technique. CONTRAST:  35m OMNIPAQUE IOHEXOL 350 MG/ML SOLN COMPARISON:  None Available. FINDINGS: CTA CHEST FINDINGS Cardiovascular: Satisfactory opacification of the pulmonary arteries to the segmental level. No evidence of pulmonary embolism. Normal heart size. No pericardial effusion. Mediastinum/Nodes: No enlarged mediastinal, hilar, or axillary lymph nodes. Thyroid gland, trachea, and esophagus demonstrate no significant findings. Endotracheal tube in place. Feeding tube coursing below the diaphragm with distal tip about the GE junction. Lungs/Pleura: Lungs are clear. No pleural effusion or pneumothorax. Musculoskeletal: No chest wall abnormality. No acute or significant osseous findings. Review of the MIP images confirms the above findings. CT ABDOMEN and PELVIS FINDINGS Hepatobiliary: No focal liver abnormality is seen. Large gallstone without gallbladder wall thickening, or biliary dilatation. Pancreas: Unremarkable. No pancreatic ductal dilatation or surrounding inflammatory changes. Spleen: Normal in size without focal abnormality. Adrenals/Urinary Tract: Adrenal glands are unremarkable. Kidneys are normal, without renal calculi, focal lesion, or hydronephrosis. Bladder is unremarkable. Stomach/Bowel: Stomach is within normal limits. Appendix appears normal. The small bowel loops are distended with air-fluid levels suggesting ileus. Vascular/Lymphatic: Aortic atherosclerosis. No enlarged abdominal or pelvic lymph nodes. Reproductive: Prostate is unremarkable. Other: No abdominal wall hernia or abnormality. No abdominopelvic ascites. Musculoskeletal:  No acute or significant osseous findings. Review of the MIP images confirms the above findings. IMPRESSION: CT angiogram chest. 1. No evidence of pulmonary embolism or acute aortic injury. 2. Lungs are clear without evidence of consolidation or pulmonary contusion. 3. Cholelithiasis without evidence of acute cholecystitis. CT abdomen/pelvis: 1. Small bowel loops are distended with air-fluid levels suggesting ileus. No evidence of bowel obstruction. 2. Aortic atherosclerosis. 3. No evidence of fracture or dislocation. 4. Feeding tube coursing below the diaphragm with distal tip about the GE junction, it could be advanced 5-6 cm. Aortic Atherosclerosis (ICD10-I70.0). Electronically Signed   By: IKeane PoliceD.O.   On: 07/26/2022 22:38   CT ABDOMEN PELVIS W CONTRAST  Result Date: 07/22/2022 CLINICAL DATA:  Pulmonary embolism suspected blunt abdominal trauma patient received 45 minutes of CPR EXAM: CT ANGIOGRAPHY CHEST CT ABDOMEN AND PELVIS WITH CONTRAST TECHNIQUE: Multidetector CT imaging of the chest was performed using the standard protocol during bolus administration of intravenous contrast. Multiplanar CT image reconstructions and MIPs were obtained to evaluate the vascular anatomy. Multidetector CT imaging of the abdomen and pelvis was performed using the standard protocol during bolus administration of intravenous contrast. RADIATION DOSE REDUCTION: This exam was performed according to the departmental dose-optimization program which includes automated exposure control, adjustment of the mA and/or kV according to patient size and/or use of iterative reconstruction technique. CONTRAST:  761mOMNIPAQUE IOHEXOL 350 MG/ML SOLN COMPARISON:  None Available. FINDINGS: CTA CHEST FINDINGS Cardiovascular: Satisfactory opacification of the pulmonary arteries to the segmental level. No  evidence of pulmonary embolism. Normal heart size. No pericardial effusion. Mediastinum/Nodes: No enlarged mediastinal, hilar, or axillary  lymph nodes. Thyroid gland, trachea, and esophagus demonstrate no significant findings. Endotracheal tube in place. Feeding tube coursing below the diaphragm with distal tip about the GE junction. Lungs/Pleura: Lungs are clear. No pleural effusion or pneumothorax. Musculoskeletal: No chest wall abnormality. No acute or significant osseous findings. Review of the MIP images confirms the above findings. CT ABDOMEN and PELVIS FINDINGS Hepatobiliary: No focal liver abnormality is seen. Large gallstone without gallbladder wall thickening, or biliary dilatation. Pancreas: Unremarkable. No pancreatic ductal dilatation or surrounding inflammatory changes. Spleen: Normal in size without focal abnormality. Adrenals/Urinary Tract: Adrenal glands are unremarkable. Kidneys are normal, without renal calculi, focal lesion, or hydronephrosis. Bladder is unremarkable. Stomach/Bowel: Stomach is within normal limits. Appendix appears normal. The small bowel loops are distended with air-fluid levels suggesting ileus. Vascular/Lymphatic: Aortic atherosclerosis. No enlarged abdominal or pelvic lymph nodes. Reproductive: Prostate is unremarkable. Other: No abdominal wall hernia or abnormality. No abdominopelvic ascites. Musculoskeletal: No acute or significant osseous findings. Review of the MIP images confirms the above findings. IMPRESSION: CT angiogram chest. 1. No evidence of pulmonary embolism or acute aortic injury. 2. Lungs are clear without evidence of consolidation or pulmonary contusion. 3. Cholelithiasis without evidence of acute cholecystitis. CT abdomen/pelvis: 1. Small bowel loops are distended with air-fluid levels suggesting ileus. No evidence of bowel obstruction. 2. Aortic atherosclerosis. 3. No evidence of fracture or dislocation. 4. Feeding tube coursing below the diaphragm with distal tip about the GE junction, it could be advanced 5-6 cm. Aortic Atherosclerosis (ICD10-I70.0). Electronically Signed   By: Keane Police  D.O.   On: 07/19/2022 22:38   DG Chest Portable 1 View  Result Date: 07/26/2022 CLINICAL DATA:  ET tube and OG tube placement EXAM: PORTABLE CHEST 1 VIEW COMPARISON:  04/28/2022 FINDINGS: Endotracheal tube tip is 3 cm above the carina. OG tube is in the stomach. Low lung volumes with vascular crowding and bibasilar atelectasis. Heart size is upper limits normal. No effusions. No acute bony abnormality. IMPRESSION: Low lung volumes with vascular crowding and bibasilar atelectasis. Electronically Signed   By: Rolm Baptise M.D.   On: 07/19/2022 21:14     Assessment and Plan:   Alison Glas is a 57 y.o. male with a hx of chronic systolic heart failure, polysubstance abuse, HTN, tobacco use, and atrial flutter s/p DCCV (06/30/22) who is being seen 07/09/2022 for the evaluation of bradycardia in the setting of PEA arrest.   # PEA Arrest with CPR ROSC # Bradycardia # Polysubstance Abuse  # Troponin Elevation # Chronic Systolic Heart Failure # History of Atrial Flutter  ::  Patient notably bradycardic peri-arrest and subsequent code.  Bradycardia seemingly unresponsive to epinephrine infusion, but his heart rate is now stable in the 50s completely off of epinephrine for several hours.  His heart rates are now in line with his heart rates after his cardioversion in early July 11, 2022.  Unclear etiology for his PEA arrest, with multiple possible suspects.  It is hard to say that his bradycardia was the culprit in this setting, it appears that it is more sequela from his acute metabolic and electrolyte abnormalities and underlying critical illness.   :: He is on medications like amiodarone that could exacerbate his bradycardia, but again I do not think that these are directly related to his underlying arrest.  Nevertheless I would recommend holding these and all AV nodal blocking agents.   :: Agree with  obtaining formal TTE and continued treatment by critical care for his postarrest recovery and metabolic  derangements. :: Continue to follow patient on telemetry :: We will discuss case with cardiac electrophysiology, but there does not appear to be any urgent indication for pacing  Risk Assessment/Risk Scores:                For questions or updates, please contact [facility] Please consult [email or web address] for contact info under    Signed, Clois Dupes, MD  07/09/2022 1:20 AM

## 2022-07-09 NOTE — Progress Notes (Signed)
LTM EEG hooked up and running - no routine performed current medhx -no initial skin breakdown - push button tested - [facility] monitoring.

## 2022-07-09 NOTE — Progress Notes (Addendum)
[facility] Progress Note Patient Name: Josephine Moschella DOB: [birth year] MRN: [id number]   Date of Service  07/09/2022  HPI/Events of Note  Has been off of sedation for several hours, suddenly became more bradycardic into the 30s sustained with P waves present on telemetry.  Becoming more hypotensive with the bradycardia.  Intermittent hypoxia with bradycardia also.  eICU Interventions  Obtain formal EKG Start dopamine 5 mcg Pads are in place, atropine drawn up    2228 -  family wants full code tonight and transition to DNR and Comfort in AM after family gets to see him and discuss [place] with rounders. RN asking if the aline still needs to be place, BP and HR responded well to Dopa. --- Can hold on aline for now. 1216 -given persistent bradycardia and worsening hypotension, ABG obtained.  Severe metabolic acidosis present.  Will replace normal saline with bicarb infusion.  1 amp of bicarb bolus.  Maintain dobutamine at 7.5 mcg.  0213 - Critical K+ 6.4 ->> + Bicarb 1 ampule, inulin 5u, D50, Calcium x1  0248 - Critical Lactic > 9.0 (was 3.9 2/9) ---> Bicarb infusion going, will trend for now  0427 - Hypotensive again, increase bicarb drip. Pending AM VBG  0630 - Refractory Hypotension, add vasopressin; increase bicarb to 150, pending vbg; family making their way into the hospital  Intervention Category Major Interventions: Arrhythmia - evaluation and management;Hypotension - evaluation and management  Patrena Santalucia 07/09/2022, 8:15 PM

## 2022-07-09 NOTE — Progress Notes (Signed)
MB performed maintenance on electrodes. All impedances are below 10k ohms. No skin breakdown noted.

## 2022-07-10 DIAGNOSIS — R4182 Altered mental status, unspecified: Secondary | ICD-10-CM | POA: Diagnosis not present

## 2022-07-10 DIAGNOSIS — I469 Cardiac arrest, cause unspecified: Secondary | ICD-10-CM | POA: Diagnosis not present

## 2022-07-10 LAB — BASIC METABOLIC PANEL
Anion gap: 21 — ABNORMAL HIGH (ref 5–15)
BUN: 34 mg/dL — ABNORMAL HIGH (ref 6–20)
CO2: 11 mmol/L — ABNORMAL LOW (ref 22–32)
Calcium: 8.7 mg/dL — ABNORMAL LOW (ref 8.9–10.3)
Chloride: 107 mmol/L (ref 98–111)
Creatinine, Ser: 4 mg/dL — ABNORMAL HIGH (ref 0.61–1.24)
GFR, Estimated: 17 mL/min — ABNORMAL LOW (ref >60)
Glucose, Bld: 140 mg/dL — ABNORMAL HIGH (ref 70–99)
Potassium: 6.4 mmol/L (ref 3.5–5.1)
Sodium: 139 mmol/L (ref 135–145)

## 2022-07-10 LAB — POCT I-STAT 7, (LYTES, BLD GAS, ICA,H+H)
Acid-base deficit: 20 mmol/L — ABNORMAL HIGH (ref 0.0–2.0)
Bicarbonate: 10.8 mmol/L — ABNORMAL LOW (ref 20.0–28.0)
Calcium, Ion: 1.2 mmol/L (ref 1.15–1.40)
HCT: 51 % (ref 39.0–52.0)
Hemoglobin: 17.3 g/dL — ABNORMAL HIGH (ref 13.0–17.0)
O2 Saturation: 95 %
Patient temperature: 37
Potassium: 6.9 mmol/L (ref 3.5–5.1)
Sodium: 138 mmol/L (ref 135–145)
TCO2: 12 mmol/L — ABNORMAL LOW (ref 22–32)
pCO2 arterial: 41 mmHg (ref 32–48)
pH, Arterial: 7.028 — CL (ref 7.35–7.45)
pO2, Arterial: 112 mmHg — ABNORMAL HIGH (ref 83–108)

## 2022-07-10 LAB — MAGNESIUM: Magnesium: 2.4 mg/dL (ref 1.7–2.4)

## 2022-07-10 LAB — TRIGLYCERIDES: Triglycerides: 107 mg/dL (ref <150)

## 2022-07-10 LAB — GLUCOSE, CAPILLARY
Glucose-Capillary: 128 mg/dL — ABNORMAL HIGH (ref 70–99)
Glucose-Capillary: 183 mg/dL — ABNORMAL HIGH (ref 70–99)
Glucose-Capillary: 45 mg/dL — ABNORMAL LOW (ref 70–99)

## 2022-07-10 LAB — PHOSPHORUS: Phosphorus: 5 mg/dL — ABNORMAL HIGH (ref 2.5–4.6)

## 2022-07-10 LAB — LACTIC ACID, PLASMA: Lactic Acid, Venous: >9 mmol/L (ref 0.5–1.9)

## 2022-07-10 MED ORDER — ATROPINE SULFATE 1 MG/10ML IJ SOSY
PREFILLED_SYRINGE | INTRAMUSCULAR | Status: AC
  Filled 2022-07-10: qty 10

## 2022-07-10 MED ORDER — SODIUM BICARBONATE 8.4 % IV SOLN
INTRAVENOUS | Status: AC
  Administered 2022-07-10: 50 meq via INTRAVENOUS
  Filled 2022-07-10: qty 50

## 2022-07-10 MED ORDER — SODIUM BICARBONATE 8.4 % IV SOLN
50.0000 meq | Freq: Once | INTRAVENOUS | Status: AC
  Administered 2022-07-10: 50 meq via INTRAVENOUS
  Filled 2022-07-10: qty 50

## 2022-07-10 MED ORDER — GLYCOPYRROLATE 0.2 MG/ML IJ SOLN: 0.2000 mg | INTRAMUSCULAR | Status: DC | PRN

## 2022-07-10 MED ORDER — VASOPRESSIN 20 UNITS/100 ML INFUSION FOR SHOCK
INTRAVENOUS | Status: AC
  Filled 2022-07-10: qty 100

## 2022-07-10 MED ORDER — GLYCOPYRROLATE 1 MG PO TABS: 1.0000 mg | ORAL_TABLET | ORAL | Status: DC | PRN

## 2022-07-10 MED ORDER — SODIUM BICARBONATE 8.4 % IV SOLN
50.0000 meq | INTRAVENOUS | Status: AC
  Administered 2022-07-10: 50 meq via INTRAVENOUS
  Filled 2022-07-10 (×3): qty 50

## 2022-07-10 MED ORDER — INSULIN ASPART 100 UNIT/ML IV SOLN
5.0000 [IU] | Freq: Once | INTRAVENOUS | Status: AC
  Administered 2022-07-10: 5 [IU] via INTRAVENOUS

## 2022-07-10 MED ORDER — FENTANYL BOLUS VIA INFUSION: 100.0000 ug | INTRAVENOUS | Status: DC | PRN

## 2022-07-10 MED ORDER — DEXTROSE 50 % IV SOLN
1.0000 | Freq: Once | INTRAVENOUS | Status: AC
  Filled 2022-07-10: qty 50

## 2022-07-10 MED ORDER — SODIUM BICARBONATE 8.4 % IV SOLN: 50.0000 meq | Freq: Once | INTRAVENOUS | Status: AC

## 2022-07-10 MED ORDER — DEXTROSE 50 % IV SOLN
INTRAVENOUS | Status: AC
  Administered 2022-07-10: 50 mL
  Filled 2022-07-10: qty 50

## 2022-07-10 MED ORDER — POLYVINYL ALCOHOL 1.4 % OP SOLN: 1.0000 [drp] | Freq: Four times a day (QID) | OPHTHALMIC | Status: DC | PRN

## 2022-07-10 MED ORDER — ACETAMINOPHEN 325 MG PO TABS: 650.0000 mg | ORAL_TABLET | Freq: Four times a day (QID) | ORAL | Status: DC | PRN

## 2022-07-10 MED ORDER — ACETAMINOPHEN 650 MG RE SUPP: 650.0000 mg | Freq: Four times a day (QID) | RECTAL | Status: DC | PRN

## 2022-07-10 MED ORDER — SODIUM BICARBONATE 8.4 % IV SOLN
INTRAVENOUS | Status: DC
  Filled 2022-07-10 (×2): qty 1000

## 2022-07-10 MED ORDER — NOREPINEPHRINE 4 MG/250ML-% IV SOLN
0.0000 ug/min | INTRAVENOUS | Status: DC
  Administered 2022-07-10: 25 ug/min via INTRAVENOUS
  Administered 2022-07-10: 10 ug/min via INTRAVENOUS
  Administered 2022-07-10: 40 ug/min via INTRAVENOUS
  Filled 2022-07-10 (×2): qty 250

## 2022-07-10 MED ORDER — CALCIUM GLUCONATE-NACL 1-0.675 GM/50ML-% IV SOLN
1.0000 g | Freq: Once | INTRAVENOUS | Status: AC
  Administered 2022-07-10: 1000 mg via INTRAVENOUS
  Filled 2022-07-10: qty 50

## 2022-07-10 MED ORDER — SODIUM CHLORIDE 0.9 % IV SOLN: INTRAVENOUS | Status: DC

## 2022-07-10 MED ORDER — FENTANYL 2500MCG IN NS 250ML (10MCG/ML) PREMIX INFUSION
0.0000 ug/h | INTRAVENOUS | Status: DC
  Administered 2022-07-10: 50 ug/h via INTRAVENOUS
  Filled 2022-07-10: qty 250

## 2022-07-10 MED ORDER — VASOPRESSIN 20 UNITS/100 ML INFUSION FOR SHOCK
0.0400 [IU]/min | INTRAVENOUS | Status: DC
  Administered 2022-07-10: 0.04 [IU]/min via INTRAVENOUS

## 2022-07-11 ENCOUNTER — Ambulatory Visit (HOSPITAL_COMMUNITY): Payer: Commercial Managed Care - HMO | Admitting: Physician Assistant

## 2022-07-14 LAB — CULTURE, BLOOD (ROUTINE X 2)
Culture: NO GROWTH
Culture: NO GROWTH
Special Requests: ADEQUATE
Special Requests: ADEQUATE

## 2022-07-26 ENCOUNTER — Ambulatory Visit: Payer: Commercial Managed Care - HMO

## 2022-07-29 NOTE — Progress Notes (Signed)
NAME:  Kevin Mora, MRN:  [id number], DOB:  [birth year], LOS: 1 ADMISSION DATE:  07/17/2022, CONSULTATION DATE:  07/03/2022 REFERRING MD:  Nicole Kindred (ED), CHIEF COMPLAINT:  PEA arrest    History of Present Illness:  57 yo man with hx of chf, aflutter, PAD, recent hx of fatigue, nausea for a few days.  Was doing fine, cooking, went upstairs.   Family found him about 10 min later unconscious. Called 911, 5 min passed before ems arrived,  PEA noted on arrival, started CPR.    Arrived to ED after 45 min CPR.  Had achieved rosc at some point for a period.  Was again undergoing CPR on arrival.    ROSC in ED for 10 min Epi infusion started.  Pt bradycardic at the time.   Igel had been placed in the field, good sats achieved with this per ED.  Attempted to change with bougie in ED, ended up removing and intubating Additional round of CPR in ED.   Bradycardia on rosc, hypertension.   Pertinent  Medical History  A flutter (Cardioversion 2/1) - was sinus brady 48 after SV  Epistaxis  Started entresto 1/16 Started amio 1/16 2024.  Stoppped metop and started coreg   HLD HTN CHF presumed NICM by cards note  Claudication - plan for aortogram (delayed due to new low EF)   Home meds:  Amio  Albuterol  Lipitor Coreg Nicoderm  Xarelto Entresto  Diltiazem  Significant Hospital Events: Including procedures, antibiotic start and stop dates in addition to other pertinent events   2/9 admitted post arrest  2/10 worsening bradycardia and increasing vasopressor requirements.  Interim History / Subjective:  Patient suddenly became bradycardic with hypotension and worsening hypoxia.  Worsening hypotension started on escalating norepinephrine, dopamine and vasopressin.  Severe lactic acidosis.  Family considering transition to comfort care overnight.  Objective   Blood pressure 125/79, pulse (!) 52, temperature 98.4 F (36.9 C), temperature source Esophageal, resp. rate (!) 23, height 5' 10"$  (1.778 m),  weight 102 kg, SpO2 92 %.    Vent Mode: PRVC FiO2 (%):  [40 %-100 %] 80 % Set Rate:  [20 bmp] 20 bmp Vt Set:  [580 mL] 580 mL PEEP:  [5 cmH20] 5 cmH20 Plateau Pressure:  [23 cmH20-30 cmH20] 23 cmH20   Intake/Output Summary (Last 24 hours) at 07/26/2022 1141 Last data filed at Jul 26, 2022 0700 Gross per 24 hour  Intake 3080.75 ml  Output 136 ml  Net 2944.75 ml    Filed Weights   07/09/22 0155 07/26/2022 0500  Weight: 95 kg 102 kg    Examination: General: non responsive, well nourished.,  HENT: conjunctival erythema, orally intubated.  Lungs: ctab  Cardiovascular: sinus brady HS normal.  Extremities cool. Abdomen: distended, nbs  Extremities: no edema  Neuro: non responsive to pain, breathing over vent rate.   GU: clear urine in foley.  Ancillary tests personally reviewed:   Creatinine 4.0, potassium 6.4. Lactic acid greater than 9 Mild transaminase elevation.  Mild troponin elevation.  Plt 145 GGO on CT consistent with aspiration vs atypical CHF ABG shows marked metabolic acidosis. Assessment & Plan:   Critically ill due to PEA cardiac arrest of unclear etiology requiring mechanical ventilation.   HFrEF with EF 30%, severe MR and atrial flutter (tachycardia mediated) Worsening bradycardia requiring dopamine infusion. Progressive multifactorial circulatory shock requiring increasing dose of vasopressors. Atrial flutter on amiodarone - currently SB Hypoxic ischemic encephalopathy. DM HTN  HLD   Plan:   -Worsening shock and acute  renal failure in the presence of uncertain neurological prognosis following prolonged cardiac arrest.  No realistic chance of meaningful recovery at this point. -Patient's condition discussed with the patient's family who have decided to transition the patient to comfort care and proceed with compassionate extubation (see separate I PAL note).   Best Practice (right click and "Reselect all SmartList Selections" daily)   Diet/type: start  tube feeds.  DVT prophylaxis: heparin IV GI prophylaxis: PPI Lines: N/A Foley:  N/A Code Status: DNR Last date of multidisciplinary goals of care discussion [mother updated 07/23/22 DNR comfort care   Kipp Brood, MD [facility] ICU Physician [place]  Pager: [phone] Mobile: [phone] After hours: [phone].

## 2022-07-29 NOTE — IPAL (Signed)
  Interdisciplinary Goals of Care Family Meeting   Date carried out: 19-Jul-2022  Location of the meeting: Bedside  Member's involved: Physician, Bedside Registered Nurse, and Family Member or next of kin  Durable Power of Attorney or acting medical decision maker: Patient's mother, Kathreen Devoid  Discussion: We discussed goals of care for [employer] .  He presented with a cardiac arrest with prolonged CPR.  Significant worsening overnight with increasing shock.  Patient has no realistic chance of making a meaningful recovery.  Patient's family has decided to transition to comfort care.  Code status:   Code Status: DNR   Disposition: In-patient comfort care  Time spent for the meeting: 15 minutes.    Kipp Brood, MD  2022/07/19, 11:47 AM

## 2022-07-29 NOTE — Progress Notes (Signed)
Blondell Bowman called and updated on pt status.  Family attempting to find ride to hospital.  Prognosis poor.  Mother wants to keep pt a full code until her arrival.

## 2022-07-29 NOTE — Progress Notes (Signed)
Extubation order in. Spoke with CCM and RN. RN to call respiratory when family is ready for extubation. Donor services has asked for more time to consult on patient per RN.

## 2022-07-29 NOTE — Progress Notes (Signed)
   08-08-2022 0906  Spiritual Encounters  Type of Visit Initial  Care provided to: [facility] present during encounter Nurse  Referral source Nurse (RN/NT/LPN)  Reason for visit End-of-life  OnCall Visit Yes  Interventions  Spiritual Care Interventions Made Compassionate presence;Prayer   Pt being transitioned to comfort care. Approx 12 family members present around bed. Offered condolences and lead prayer. Offered comfort and prayer to pt's mother. Spoke with RN to inform that Chaplain remains available throughout the day.

## 2022-07-29 NOTE — Procedures (Addendum)
Patient Name: Kevin Mora  MRN: [id number]  Epilepsy Attending: Lora Havens  Referring Physician/Provider: Lorenza Chick, MD  Duration: 2022-07-28 0149 to 2022/07/28 1029   Patient history: 57yo M s/p cardiac arrest. EEG to evaluate for seizure   Level of alertness:  comatose   AEDs during EEG study: None   Technical aspects: This EEG study was done with scalp electrodes positioned according to the 10-20 International system of electrode placement. Electrical activity was reviewed with band pass filter of 1-70Hz$ , sensitivity of 7 uV/mm, display speed of 66m/sec with a 60Hz$  notched filter applied as appropriate. EEG data were recorded continuously and digitally stored.  Video monitoring was available and reviewed as appropriate.   Description: EEG showed continuous generalized background suppression, not reactive to tactile stimulation. Hyperventilation and photic stimulation were not performed.      ABNORMALITY - Background suppression, generalized   IMPRESSION: This study is suggestive of profound diffuse encephalopathy, nonspecific etiology but in the setting of cardiac arrest could be due to anoxic/hypoxic brain injury. No seizures or epileptiform discharges were seen throughout the recording.  Daymond Cordts [name]

## 2022-07-29 NOTE — Progress Notes (Signed)
vLTM discontinued.  Deceased.  No skin breakdown noted at all skin sites

## 2022-07-29 NOTE — Discharge Summary (Signed)
DEATH SUMMARY   Patient Details  Name: Kevin Mora MRN: [id number] DOB: 10/01/1965  Admission/Discharge Information   Admit Date:  July 10, 2022  Date of Death: Date of Death: Jul 12, 2022  Time of Death: Time of Death: 0935  Length of Stay: 1  Referring Physician: Martyn Malay, MD   Reason(s) for Hospitalization  Bradycardic cardiac arrest.  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 year old male who was found down in the bathroom.  CPR started 15 minutes after last seen normal.  Found to be in PEA.  Achieved ROSC after 45 minutes..  Found to be bradycardic so started on epinephrine infusion.  Past medical history of atrial flutter peripheral vascular disease.  Had been feeling unwell the last few days.  Neurologically there was no response to painful stimuli off sedation. Initial plan was to follow patient till 96 hours for formal neuro prognostication.  EEG showed no evidence of seizures.  Overnight he developed increasing bradycardia and hypotension refractory to increasing these doses of vasopressors.  With his significant decline in the family opted to transition him to comfort care.  Pertinent Labs and Studies  Significant Diagnostic Studies Overnight EEG with video  Result Date: 07/09/2022 Lora Havens, MD     Jul 12, 2022  4:43 AM Patient Name: Kevin Mora MRN: [id number] Epilepsy Attending: Lora Havens Referring Physician/Provider: Lorenza Chick, MD Duration: 07/09/2022 0149 to 07/12/22 0149 Patient history: 57yo M s/p cardiac arrest. EEG to evaluate for seizure Level of alertness:  comatose AEDs during EEG  study: Propofol Technical aspects: This EEG study was done with scalp electrodes positioned according to the 10-20 International system of electrode placement. Electrical activity was reviewed with band pass filter of 1-70Hz$ , sensitivity of 7 uV/mm, display speed of 28m/sec with a 60Hz$  notched filter applied as appropriate. EEG data were recorded continuously and digitally stored.  Video monitoring was available and reviewed as appropriate. Description: EEG showed continuous generalized background suppression, not reactive to tactile stimulation. Hyperventilation and photic stimulation were not performed.   ABNORMALITY - Background suppression, generalized IMPRESSION: This study is suggestive of profound diffuse encephalopathy, nonspecific etiology but in the setting of cardiac arrest could be due to anoxic/hypoxic brain injury. No seizures or epileptiform discharges were seen throughout the recording. PLora Havens  CT Angio Head W or Wo Contrast  Result Date: 2Feb 11, 2024CLINICAL DATA:  Initial evaluation for headache.  History of CPR. EXAM: CT ANGIOGRAPHY HEAD TECHNIQUE: Multidetector CT imaging of the head was performed using the standard protocol during bolus administration of intravenous contrast. Multiplanar CT image reconstructions and MIPs were obtained to evaluate the vascular anatomy. RADIATION DOSE REDUCTION: This exam was performed according to the departmental dose-optimization program which includes automated exposure control, adjustment of the mA and/or kV according to patient size and/or use of iterative reconstruction technique. CONTRAST:  716mOMNIPAQUE IOHEXOL 350 MG/ML SOLN COMPARISON:  Prior study from 01/09/2018. FINDINGS: CT HEAD Brain: Examination technically limited by motion and scattered streak artifact. Cerebral volume within normal limits. Small remote lacunar infarct present at the anterior right basal ganglia. No acute intracranial hemorrhage. No visible acute large vessel  territory infarct. There is loss of cortical sulcation as compared to previous exam, which  could reflect early changes of anoxia with cerebral edema given history of CPR. No mass lesion, midline shift or of or significant mass effect. No hydrocephalus or extra-axial fluid collection. Vascular: No visible abnormal hyperdense vessel. Skull: Scalp soft tissues demonstrate no acute finding. Calvarium intact. Sinuses: Globes orbital soft tissues demonstrate no acute finding. Moderate mucosal thickening present about the sphenoethmoidal and maxillary sinuses. Mastoid air cells are largely clear. Patient is intubated. Other: Evaluation of the intracranial circulation limited by motion. Visualized distal cervical segments of both internal carotid arteries are patent. 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 On the right, there is focal moderate stenosis involving the cavernous right ICA (series 8, image 65). Right A1 segment patent. Left A1 hypoplastic and/or absent. Normal anterior communicating artery complex. Anterior cerebral arteries patent without visible stenosis. No M1 stenosis or occlusion. No proximal MCA branch occlusion or high-grade stenosis. Distal MCA branches perfused and symmetric. CTA HEAD Anterior circulation: Posterior circulation: Both V4 segments patent without stenosis. Left vertebral artery slightly dominant. Both PICA patent. Basilar patent to its distal aspect without visible stenosis. Superior cerebellar arteries patent bilaterally. Both PCAs primarily supplied via the basilar. Short-segment moderate left P2 stenosis noted (series 13, image 13). PCAs otherwise patent to their distal aspects. Venous sinuses: Grossly patent allowing for timing the contrast bolus. Anatomic variants: As above.  No aneurysm. Review of the MIP images confirms the above findings. IMPRESSION: CT HEAD  IMPRESSION: 1. Motion degraded exam. 2. Question loss of cortical sulcation, which could reflect early changes of anoxia with cerebral edema given history of CPR. Short interval follow-up CT and/or MRI could be performed for further evaluation as warranted. 3. No other acute intracranial abnormality. 4. 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 CTA HEAD AND NECK IMPRESSION: 1. Negative CTA for large vessel occlusion or other emergent finding. 2. 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 3. Focal moderate stenosis involving the cavernous right ICA, with additional short-segment moderate left P2 stenosis. Electronically Signed   By: Jeannine Boga M.D.   On: 06/30/2022 22:42   CT Angio Chest PE W and/or Wo Contrast  Result Date: 07/06/2022 CLINICAL DATA:  Pulmonary embolism suspected blunt abdominal trauma patient received 45 minutes of CPR EXAM: CT ANGIOGRAPHY CHEST CT ABDOMEN AND PELVIS WITH CONTRAST TECHNIQUE: Multidetector CT imaging of the chest was performed using the standard protocol during bolus administration of intravenous contrast. Multiplanar CT image reconstructions and MIPs were obtained to evaluate the vascular anatomy. Multidetector CT imaging of the abdomen and pelvis was performed using the standard protocol during bolus administration of intravenous contrast. RADIATION DOSE REDUCTION: This exam was performed according to the departmental dose-optimization program which includes automated exposure control, adjustment of the mA and/or kV according to patient size and/or use of iterative reconstruction technique. CONTRAST:  16m OMNIPAQUE IOHEXOL 350 MG/ML SOLN COMPARISON:  None Available. FINDINGS: CTA CHEST FINDINGS Cardiovascular: Satisfactory opacification of the pulmonary arteries to the segmental level. No evidence of pulmonary embolism. Normal heart size. No pericardial effusion.  Mediastinum/Nodes: No enlarged mediastinal, hilar, or axillary lymph nodes. Thyroid gland, trachea, and esophagus demonstrate no significant findings. Endotracheal tube in place. Feeding tube coursing below the diaphragm with distal tip about the GE junction. Lungs/Pleura: Lungs are clear. No pleural effusion or pneumothorax. Musculoskeletal: No chest wall abnormality. No acute or significant osseous  findings. Review of the MIP images confirms the above findings. CT ABDOMEN and PELVIS FINDINGS Hepatobiliary: No focal liver abnormality is seen. Large gallstone without gallbladder wall thickening, or biliary dilatation. Pancreas: Unremarkable. No pancreatic ductal dilatation or surrounding inflammatory changes. Spleen: Normal in size without focal abnormality. Adrenals/Urinary Tract: Adrenal glands are unremarkable. Kidneys are normal, without renal calculi, focal lesion, or hydronephrosis. Bladder is unremarkable. Stomach/Bowel: Stomach is within normal limits. Appendix appears normal. The small bowel loops are distended with air-fluid levels suggesting ileus. Vascular/Lymphatic: Aortic atherosclerosis. No enlarged abdominal or pelvic lymph nodes. Reproductive: Prostate is unremarkable. Other: No abdominal wall hernia or abnormality. No abdominopelvic ascites. Musculoskeletal: No acute or significant osseous findings. Review of the MIP images confirms the above findings. IMPRESSION: CT angiogram chest. 1. No evidence of pulmonary embolism or acute aortic injury. 2. Lungs are clear without evidence of consolidation or pulmonary contusion. 3. Cholelithiasis without evidence of acute cholecystitis. CT abdomen/pelvis: 1. Small bowel loops are distended with air-fluid levels suggesting ileus. No evidence of bowel obstruction. 2. Aortic atherosclerosis. 3. No evidence of fracture or dislocation. 4. Feeding tube coursing below the diaphragm with distal tip about the GE junction, it could be advanced 5-6 cm. Aortic  Atherosclerosis (ICD10-I70.0). Electronically Signed   By: Keane Police D.O.   On: 07/23/2022 22:38   CT ABDOMEN PELVIS W CONTRAST  Result Date: 07/17/2022 CLINICAL DATA:  Pulmonary embolism suspected blunt abdominal trauma patient received 45 minutes of CPR EXAM: CT ANGIOGRAPHY CHEST CT ABDOMEN AND PELVIS WITH CONTRAST TECHNIQUE: Multidetector CT imaging of the chest was performed using the standard protocol during bolus administration of intravenous contrast. Multiplanar CT image reconstructions and MIPs were obtained to evaluate the vascular anatomy. Multidetector CT imaging of the abdomen and pelvis was performed using the standard protocol during bolus administration of intravenous contrast. RADIATION DOSE REDUCTION: This exam was performed according to the departmental dose-optimization program which includes automated exposure control, adjustment of the mA and/or kV according to patient size and/or use of iterative reconstruction technique. CONTRAST:  73m OMNIPAQUE IOHEXOL 350 MG/ML SOLN COMPARISON:  None Available. FINDINGS: CTA CHEST FINDINGS Cardiovascular: Satisfactory opacification of the pulmonary arteries to the segmental level. No evidence of pulmonary embolism. Normal heart size. No pericardial effusion. Mediastinum/Nodes: No enlarged mediastinal, hilar, or axillary lymph nodes. Thyroid gland, trachea, and esophagus demonstrate no significant findings. Endotracheal tube in place. Feeding tube coursing below the diaphragm with distal tip about the GE junction. Lungs/Pleura: Lungs are clear. No pleural effusion or pneumothorax. Musculoskeletal: No chest wall abnormality. No acute or significant osseous findings. Review of the MIP images confirms the above findings. CT ABDOMEN and PELVIS FINDINGS Hepatobiliary: No focal liver abnormality is seen. Large gallstone without gallbladder wall thickening, or biliary dilatation. Pancreas: Unremarkable. No pancreatic ductal dilatation or surrounding  inflammatory changes. Spleen: Normal in size without focal abnormality. Adrenals/Urinary Tract: Adrenal glands are unremarkable. Kidneys are normal, without renal calculi, focal lesion, or hydronephrosis. Bladder is unremarkable. Stomach/Bowel: Stomach is within normal limits. Appendix appears normal. The small bowel loops are distended with air-fluid levels suggesting ileus. Vascular/Lymphatic: Aortic atherosclerosis. No enlarged abdominal or pelvic lymph nodes. Reproductive: Prostate is unremarkable. Other: No abdominal wall hernia or abnormality. No abdominopelvic ascites. Musculoskeletal: No acute or significant osseous findings. Review of the MIP images confirms the above findings. IMPRESSION: CT angiogram chest. 1. No evidence of pulmonary embolism or acute aortic injury. 2. Lungs are clear without evidence of consolidation or pulmonary contusion. 3. Cholelithiasis without evidence of acute cholecystitis. CT  abdomen/pelvis: 1. Small bowel loops are distended with air-fluid levels suggesting ileus. No evidence of bowel obstruction. 2. Aortic atherosclerosis. 3. No evidence of fracture or dislocation. 4. Feeding tube coursing below the diaphragm with distal tip about the GE junction, it could be advanced 5-6 cm. Aortic Atherosclerosis (ICD10-I70.0). Electronically Signed   By: Keane Police D.O.   On: 07/23/2022 22:38   DG Chest Portable 1 View  Result Date: 07/06/2022 CLINICAL DATA:  ET tube and OG tube placement EXAM: PORTABLE CHEST 1 VIEW COMPARISON:  04/28/2022 FINDINGS: Endotracheal tube tip is 3 cm above the carina. OG tube is in the stomach. Low lung volumes with vascular crowding and bibasilar atelectasis. Heart size is upper limits normal. No effusions. No acute bony abnormality. IMPRESSION: Low lung volumes with vascular crowding and bibasilar atelectasis. Electronically Signed   By: Rolm Baptise M.D.   On: 07/07/2022 21:14    Microbiology Recent Results (from the past 240 hour(s))  Resp panel by  RT-PCR (RSV, Flu A&B, Covid) Anterior Nasal Swab     Status: None   Collection Time: 07/24/2022 11:27 PM   Specimen: Anterior Nasal Swab  Result Value Ref Range Status   SARS Coronavirus 2 by RT PCR NEGATIVE NEGATIVE Final   Influenza A by PCR NEGATIVE NEGATIVE Final   Influenza B by PCR NEGATIVE NEGATIVE Final    Comment: (NOTE) The Xpert Xpress SARS-CoV-2/FLU/RSV plus assay is intended as an aid in the diagnosis of influenza from Nasopharyngeal swab specimens and should not be used as a sole basis for treatment. Nasal washings and aspirates are unacceptable for Xpert Xpress SARS-CoV-2/FLU/RSV testing.  Fact Sheet for Patients: EntrepreneurPulse.com.au  Fact Sheet for Healthcare Providers: IncredibleEmployment.be  This test is not yet approved or cleared by the [place] FDA and has been authorized for detection and/or diagnosis of SARS-CoV-2 by FDA under an Emergency Use Authorization (EUA). This EUA will remain in effect (meaning this test can be used) for the duration of the COVID-19 declaration under Section 564(b)(1) of the Act, 21 U.S.C. section 360bbb-3(b)(1), unless the authorization is terminated or revoked.     Resp Syncytial Virus by PCR NEGATIVE NEGATIVE Final    Comment: (NOTE) Fact Sheet for Patients: EntrepreneurPulse.com.au  Fact Sheet for Healthcare Providers: IncredibleEmployment.be  This test is not yet approved or cleared by the [place] FDA and has been authorized for detection and/or diagnosis of SARS-CoV-2 by FDA under an Emergency Use Authorization (EUA). This EUA will remain in effect (meaning this test can be used) for the duration of the COVID-19 declaration under Section 564(b)(1) of the Act, 21 U.S.C. section 360bbb-3(b)(1), unless the authorization is terminated or revoked.  Performed at [facility], [place] [address] [place], [place] [address]   Culture,  blood (Routine X 2) w Reflex to ID Panel     Status: None (Preliminary result)   Collection Time: 07/09/22  3:54 AM   Specimen: BLOOD  Result Value Ref Range Status   Specimen Description BLOOD BLOOD LEFT HAND  Final   Special Requests   Final    BOTTLES DRAWN AEROBIC AND ANAEROBIC Blood Culture adequate volume   Culture   Final    NO GROWTH 4 DAYS Performed at [facility], [place] [address] [place], [place] [address]    Report Status PENDING  Incomplete  Culture, blood (Routine X 2) w Reflex to ID Panel     Status: None (Preliminary result)   Collection Time: 07/09/22  3:57 AM  Specimen: BLOOD  Result Value Ref Range Status   Specimen Description BLOOD BLOOD LEFT ARM  Final   Special Requests   Final    BOTTLES DRAWN AEROBIC AND ANAEROBIC Blood Culture adequate volume   Culture   Final    NO GROWTH 4 DAYS Performed at [facility], [address] [place], [place] [address]    Report Status PENDING  Incomplete    Lab Basic Metabolic Panel: Recent Labs  Lab 06/30/2022 2031 07/12/2022 2100 07/25/2022 2101 07/14/2022 2113 07/09/22 0313 07/09/22 0333 07/09/22 1402 07/09/22 1704 07/26/2022 0011 2022-07-26 0113 07/26/22 0613  NA 139 141 140 138  --  138  --   --  138 139  --   K 5.3* 5.1 5.0 6.3*  --  4.7  --   --  6.9* 6.4*  --   CL 107 104  --   --   --   --   --   --   --  107  --   CO2 18*  --   --   --   --   --   --   --   --  11*  --   GLUCOSE 244* 230*  --   --   --   --   --   --   --  140*  --   BUN 11 13  --   --   --   --   --   --   --  34*  --   CREATININE 1.70* 1.50*  --   --   --   --   --   --   --  4.00*  --   CALCIUM 8.4*  --   --   --   --   --   --   --   --  8.7*  --   MG  --   --   --   --  1.8  --  2.7* 2.6*  --   --  2.4  PHOS  --   --   --   --   --   --  2.6 2.8  --   --  5.0*   Liver Function Tests: Recent Labs  Lab 07/07/2022 2031  AST 203*  ALT 109*  ALKPHOS 160*  BILITOT 0.6  PROT 5.7*  ALBUMIN 2.8*   No results for input(s):  "LIPASE", "AMYLASE" in the last 168 hours. No results for input(s): "AMMONIA" in the last 168 hours. CBC: Recent Labs  Lab 07/11/2022 2031 07/04/2022 2100 07/11/2022 2101 07/03/2022 2113 07/09/22 0333 2022/07/26 0011  WBC 5.5  --   --   --   --   --   NEUTROABS 2.8  --   --   --   --   --   HGB 13.2 14.6 14.3 14.3 15.6 17.3*  HCT 43.8 43.0 42.0 42.0 46.0 51.0  MCV 97.6  --   --   --   --   --   PLT 145*  --   --   --   --   --    Cardiac Enzymes: No results for input(s): "CKTOTAL", "CKMB", "CKMBINDEX", "TROPONINI" in the last 168 hours. Sepsis Labs: Recent Labs  Lab 07/23/2022 2031 07/02/2022 2033 07/03/2022 2327 26-Jul-2022 0113  WBC 5.5  --   --   --   LATICACIDVEN  --  7.2* 3.9* >9.0*    Procedures/Operations  Mechanical ventilation.   Joene Gelder  07/13/2022, 3:01 PM

## 2022-07-29 DEATH — deceased

## 2022-08-12 ENCOUNTER — Ambulatory Visit: Payer: Commercial Managed Care - HMO

## 2022-09-13 ENCOUNTER — Other Ambulatory Visit (HOSPITAL_COMMUNITY): Payer: Commercial Managed Care - HMO

## 2022-10-03 ENCOUNTER — Ambulatory Visit: Payer: Commercial Managed Care - HMO | Admitting: Cardiology
